# Patient Record
Sex: Male | Born: 1950 | Race: White | Hispanic: No | Marital: Married | Smoking: Never smoker
Health system: Southern US, Community
[De-identification: ages and names within clinical notes are randomized; demographics above are authoritative.]

## PROBLEM LIST (undated history)

## (undated) DIAGNOSIS — Z87442 Personal history of urinary calculi: Secondary | ICD-10-CM

## (undated) DIAGNOSIS — J683 Other acute and subacute respiratory conditions due to chemicals, gases, fumes and vapors: Secondary | ICD-10-CM

## (undated) DIAGNOSIS — J3081 Allergic rhinitis due to animal (cat) (dog) hair and dander: Secondary | ICD-10-CM

## (undated) DIAGNOSIS — G4733 Obstructive sleep apnea (adult) (pediatric): Secondary | ICD-10-CM

## (undated) DIAGNOSIS — N200 Calculus of kidney: Secondary | ICD-10-CM

## (undated) DIAGNOSIS — I10 Essential (primary) hypertension: Secondary | ICD-10-CM

## (undated) DIAGNOSIS — Z8669 Personal history of other diseases of the nervous system and sense organs: Secondary | ICD-10-CM

## (undated) DIAGNOSIS — N486 Induration penis plastica: Secondary | ICD-10-CM

## (undated) DIAGNOSIS — K760 Fatty (change of) liver, not elsewhere classified: Secondary | ICD-10-CM

## (undated) DIAGNOSIS — C439 Malignant melanoma of skin, unspecified: Secondary | ICD-10-CM

## (undated) DIAGNOSIS — Z9109 Other allergy status, other than to drugs and biological substances: Secondary | ICD-10-CM

## (undated) DIAGNOSIS — M1990 Unspecified osteoarthritis, unspecified site: Secondary | ICD-10-CM

## (undated) DIAGNOSIS — I878 Other specified disorders of veins: Secondary | ICD-10-CM

## (undated) DIAGNOSIS — R7301 Impaired fasting glucose: Secondary | ICD-10-CM

## (undated) HISTORY — PX: TOOTH EXTRACTION: SUR596

## (undated) HISTORY — PX: SHOULDER ARTHROSCOPY: SHX128

## (undated) HISTORY — DX: Other acute and subacute respiratory conditions due to chemicals, gases, fumes and vapors: J68.3

## (undated) HISTORY — PX: KNEE ARTHROSCOPY: SUR90

## (undated) HISTORY — DX: Impaired fasting glucose: R73.01

## (undated) HISTORY — DX: Other specified disorders of veins: I87.8

## (undated) HISTORY — DX: Fatty (change of) liver, not elsewhere classified: K76.0

## (undated) HISTORY — PX: COLONOSCOPY: SHX174

---

## 2003-09-05 ENCOUNTER — Emergency Department (HOSPITAL_COMMUNITY): Admission: EM | Admit: 2003-09-05 | Discharge: 2003-09-05 | Payer: Self-pay | Admitting: Emergency Medicine

## 2003-10-07 ENCOUNTER — Ambulatory Visit (HOSPITAL_COMMUNITY): Admission: RE | Admit: 2003-10-07 | Discharge: 2003-10-07 | Payer: Self-pay | Admitting: *Deleted

## 2005-03-23 ENCOUNTER — Emergency Department (HOSPITAL_COMMUNITY): Admission: AD | Admit: 2005-03-23 | Discharge: 2005-03-23 | Payer: Self-pay | Admitting: Family Medicine

## 2006-01-30 ENCOUNTER — Ambulatory Visit: Payer: Self-pay | Admitting: Internal Medicine

## 2006-01-30 ENCOUNTER — Ambulatory Visit (HOSPITAL_COMMUNITY): Admission: RE | Admit: 2006-01-30 | Discharge: 2006-01-30 | Payer: Self-pay | Admitting: Internal Medicine

## 2006-05-28 ENCOUNTER — Ambulatory Visit (HOSPITAL_COMMUNITY): Admission: RE | Admit: 2006-05-28 | Discharge: 2006-05-28 | Payer: Self-pay | Admitting: Family Medicine

## 2007-08-18 ENCOUNTER — Emergency Department (HOSPITAL_COMMUNITY): Admission: EM | Admit: 2007-08-18 | Discharge: 2007-08-19 | Payer: Self-pay | Admitting: Emergency Medicine

## 2007-12-08 ENCOUNTER — Emergency Department (HOSPITAL_COMMUNITY): Admission: EM | Admit: 2007-12-08 | Discharge: 2007-12-09 | Payer: Self-pay | Admitting: Emergency Medicine

## 2007-12-14 ENCOUNTER — Ambulatory Visit (HOSPITAL_COMMUNITY): Admission: RE | Admit: 2007-12-14 | Discharge: 2007-12-14 | Payer: Self-pay | Admitting: Urology

## 2008-11-07 ENCOUNTER — Ambulatory Visit (HOSPITAL_COMMUNITY): Admission: RE | Admit: 2008-11-07 | Discharge: 2008-11-07 | Payer: Self-pay | Admitting: Family Medicine

## 2009-08-11 ENCOUNTER — Emergency Department (HOSPITAL_COMMUNITY): Admission: EM | Admit: 2009-08-11 | Discharge: 2009-08-11 | Payer: Self-pay | Admitting: Emergency Medicine

## 2010-05-06 ENCOUNTER — Encounter: Payer: Self-pay | Admitting: Family Medicine

## 2010-08-30 NOTE — Op Note (Signed)
NAMEMITESH, ROSENDAHL                ACCOUNT NO.:  [id number]   MEDICAL RECORD NO.:  [id number]          PATIENT TYPE:  AMB   LOCATION:  DAY                           FACILITY:  [facility]   PHYSICIAN:  R. Roetta Sessions, M.D. DATE OF BIRTH:  20-Jan-1951   DATE OF PROCEDURE:  01/30/2006  DATE OF DISCHARGE:                                 OPERATIVE REPORT   PROCEDURE:  Screening colonoscopy.   INDICATIONS FOR PROCEDURE:  The patient is a 60 year old Caucasian male sent  over courtesy of Dr. Simone Curia for colorectal cancer screening.  He is  devoid of any lower GI tract symptoms.  There is no family history of  colorectal neoplasia.  He has never had his colon imaged previously.  Colonoscopy is now being done as standard screening maneuver.  This approach  has discussed the patient at length.  Potential risks, benefits,  alternatives have been reviewed, questions answered.  He is agreeable.  Please see documentation in the medical record.   PROCEDURE NOTE:  O2 saturation, blood pressure, pulse, respiration,  monitored throughout entire procedure.   CONSCIOUS SEDATION:  Versed 4 mg IV, Demerol 75 mg IV in divided doses.   INSTRUMENT USED:  Olympus video chip system.   FINDINGS:  Digital rectal exam revealed no abnormalities.   ENDOSCOPIC FINDINGS:  The prep was adequate.  Rectum:  Examination rectal mucosa including retroflex view of the anal  verge revealed no abnormalities.  Colon:  Colonic mucosa surveyed from rectosigmoid junction through the left,  transverse, right colon to the area of appendiceal orifice, ileocecal valve  and cecum.  These structures well seen and photographed for the record.  From this level scope slowly withdrawn, all previously mentioned  mucosal  surfaces were again seen.  The patient was noted have scattered left-sided  diverticula.  Remainder colonic mucosa appeared normal.  The patient  tolerated the procedure well.  Was reacted in endoscopy.   IMPRESSION:  Normal rectum, left-sided diverticula, remaining colonic mucosa  appeared normal.   RECOMMENDATIONS:  Diverticulosis literature provided Mr. Syme.  Repeat  screening colonoscopy 10 years.      Jonathon Bellows, M.D.  Electronically Signed     RMR/MEDQ  D:  01/30/2006  T:  01/31/2006  Job:  [id number]   cc:   Donna Bernard, M.D.  Fax: [phone]

## 2011-06-27 ENCOUNTER — Emergency Department (HOSPITAL_COMMUNITY)
Admission: EM | Admit: 2011-06-27 | Discharge: 2011-06-27 | Disposition: A | Discharge status: Home / Self Care | Payer: 59 | Source: Home / Self Care | Attending: Emergency Medicine | Admitting: Emergency Medicine

## 2011-06-27 ENCOUNTER — Encounter (HOSPITAL_COMMUNITY): Payer: Self-pay | Admitting: Emergency Medicine

## 2011-06-27 DIAGNOSIS — S058X9A Other injuries of unspecified eye and orbit, initial encounter: Secondary | ICD-10-CM

## 2011-06-27 DIAGNOSIS — S0500XA Injury of conjunctiva and corneal abrasion without foreign body, unspecified eye, initial encounter: Secondary | ICD-10-CM

## 2011-06-27 HISTORY — DX: Allergic rhinitis due to animal (cat) (dog) hair and dander: J30.81

## 2011-06-27 HISTORY — DX: Essential (primary) hypertension: I10

## 2011-06-27 HISTORY — DX: Calculus of kidney: N20.0

## 2011-06-27 HISTORY — DX: Other allergy status, other than to drugs and biological substances: Z91.09

## 2011-06-27 MED ORDER — CIPROFLOXACIN HCL 0.3 % OP SOLN: OPHTHALMIC | Status: DC

## 2011-06-27 MED ORDER — OXYCODONE-ACETAMINOPHEN 5-325 MG PO TABS: ORAL_TABLET | ORAL | Status: AC

## 2011-06-27 MED ORDER — TETRACAINE HCL 0.5 % OP SOLN
OPHTHALMIC | Status: AC
  Filled 2011-06-27: qty 2

## 2011-06-27 NOTE — ED Notes (Signed)
States he was trimming limbs off trees today, picked up a limb, and hit himself in right eye with limb.  C/O continued pain, light sensitivity, and irritation.  C/o blurred vision in right eye.  Has applied cold cloth and instilled Visine eye drops.

## 2011-06-27 NOTE — Discharge Instructions (Signed)
Corneal Abrasion The cornea is the clear covering at the front and center of the eye. When looking at the colored portion (iris) of the eye, you are looking through that person's cornea.  This very thin tissue is made up of many layers. The surface layer is a single layer of cells called the corneal epithelium. This is one of the most sensitive tissues in the body. If a scratch or injury causes the corneal epithelium to come off, it is called a corneal abrasion. If the injury extends to the tissues below the epithelium, the condition is called a corneal ulcer.  CAUSES   Scratches.   Trauma.   Foreign body in the eye.   Some people have recurrences of abrasions in the area of the original injury even after they heal. This is called recurrent erosion syndrome. Recurrent erosion syndromes generally improve and go away with time.  SYMPTOMS   Eye pain.   Difficulty or inability to keep the injured eye open.   The eye becomes very sensitive to light.   Recurrent erosions tend to happen suddenly, first thing in the morning - usually upon awakening and opening the eyes.  DIAGNOSIS  Your eye professional can diagnose a corneal abrasion during an eye exam. Dye is usually placed in the eye using a drop or a small paper strip moistened by the patient's tears. When the eye is examined with a special light, the abrasion shows up clearly because of the dye. TREATMENT   Small abrasions may be treated with antibiotic drops or ointment alone.   Usually a pressure patch is specially applied. Pressure patches prevent the eye from blinking, allowing the corneal epithelium to heal. Because blinking is less, a pressure patch also reduces the amount of pain present in the eye during healing. Most corneal abrasions heal within 2-3 days with no effect on vision. WARNING: Do not drive or operate machinery while your eye is patched. Your ability to judge distances is impaired.   If abrasion becomes infected and  spreads to the deeper tissues of the cornea, a corneal ulcer can result. This is serious because it can cause corneal scarring. Corneal scars interfere with light passing through the cornea, and cause a loss of vision in the involved eye.   If your caregiver has given you a follow-up appointment, it is very important to keep that appointment. Not keeping the appointment could result in a severe eye infection or permanent loss of vision. If there is any problem keeping the appointment, you must call back to this facility for assistance.  SEEK MEDICAL CARE IF:   You have pain, light sensitivity and a scratchy feeling in one eye (or both).   Your pressure patch keeps loosening up and you can blink your eye under the patch after treatment.   Any kind of discharge develops from the involved eye after treatment or if the lids stick together in the morning.   You have the same symptoms in the morning as you did with the original abrasion days, weeks or months after the abrasion healed.  MAKE SURE YOU:   Understand these instructions.   Will watch your condition.   Will get help right away if you are not doing well or get worse.  Document Released: 03/28/2000 Document Revised: 03/20/2011 Document Reviewed: 11/04/2007 [employer] Patient Information 2012 [employer]. 

## 2011-06-27 NOTE — ED Provider Notes (Signed)
Chief Complaint  Patient presents with  . Eye Injury    History of Present Illness:   [name] is a 61 year old male who injured his right eye about 2:30 this afternoon. He poked a branch in the eye ever since then the vision has been blurry in the eye has been very painful. Has been red and tearing. It hurts to blink or to move his eyes.  Review of Systems:  Other than noted above, the patient denies any of the following symptoms: Systemic:  No fever, chills, sweats, fatigue, or weight loss. Eye:  No redness, eye pain, photophobia, discharge, blurred vision, or diplopia. ENT:  No nasal congestion, rhinorrhea, or sore throat. Lymphatic:  No adenopathy. Skin:  No rash or pruritis.  PMFSH:  Past medical history, family history, social history, meds, and allergies were reviewed.  Physical Exam:   Vital signs:  BP 144/93  Pulse 71  Temp(Src) 98.4 F (36.9 C) (Oral)  Resp 18  SpO2 96% General:  Alert and in no distress. Eye:  The lids and periorbital tissues are normal. The conjunctiva is injected and the eye is tearing. There no foreign bodies present. The anterior chamber was normal. Funduscopic exam is normal. The cornea is grossly intact. Fluorescein staining however shows a small central abrasion. PERRLA, full EOMs. ENT:  TMs and canals clear.  Nasal mucosa normal.  No intra-oral lesions, mucous membranes moist, pharynx clear. Neck:  No adenopathy tenderness or mass. Skin:  Clear, warm and dry.  Assessment:   Diagnoses that have been ruled out:  None  Diagnoses that are still under consideration:  None  Final diagnoses:  Corneal abrasion    Plan:   1.  The following meds were prescribed:   New Prescriptions   CIPROFLOXACIN (CILOXAN) 0.3 % OPHTHALMIC SOLUTION    Administer 1 drop, every 2 hours, while awake, for 2 days. Then 1 drop, every 4 hours, while awake, for the next 5 days.   OXYCODONE-ACETAMINOPHEN (PERCOCET) 5-325 MG PER TABLET    1 to 2 tablets every 6 hours as  needed for pain.   2.  The patient was instructed in symptomatic care and handouts were given. 3.  The patient was told to return if becoming worse in any way, if no better in 3 or 4 days, and given some red flag symptoms that would indicate earlier return.     Reuben Likes, MD 06/27/11 2206

## 2011-06-27 NOTE — ED Notes (Signed)
Patient does wear glasses.

## 2013-06-18 ENCOUNTER — Other Ambulatory Visit: Payer: Self-pay | Admitting: Family Medicine

## 2013-06-22 ENCOUNTER — Other Ambulatory Visit: Payer: Self-pay | Admitting: Family Medicine

## 2013-06-23 ENCOUNTER — Other Ambulatory Visit: Payer: Self-pay | Admitting: Family Medicine

## 2013-06-25 ENCOUNTER — Encounter: Payer: Self-pay | Admitting: *Deleted

## 2013-07-11 ENCOUNTER — Other Ambulatory Visit: Payer: Self-pay | Admitting: *Deleted

## 2013-07-11 ENCOUNTER — Telehealth: Payer: Self-pay | Admitting: Family Medicine

## 2013-07-11 DIAGNOSIS — K760 Fatty (change of) liver, not elsewhere classified: Secondary | ICD-10-CM

## 2013-07-11 DIAGNOSIS — I10 Essential (primary) hypertension: Secondary | ICD-10-CM

## 2013-07-11 DIAGNOSIS — Z13220 Encounter for screening for lipoid disorders: Secondary | ICD-10-CM

## 2013-07-11 DIAGNOSIS — Z125 Encounter for screening for malignant neoplasm of prostate: Secondary | ICD-10-CM

## 2013-07-11 NOTE — Telephone Encounter (Signed)
Lip liv m7 psa 

## 2013-07-11 NOTE — Telephone Encounter (Signed)
Patient needs order for blood work. °

## 2013-07-11 NOTE — Telephone Encounter (Signed)
Patient notified and verbalized understanding. 

## 2013-07-11 NOTE — Telephone Encounter (Signed)
Patient has not been seen in over a year 

## 2013-07-13 LAB — BASIC METABOLIC PANEL
BUN: 20 mg/dL (ref 6–23)
CO2: 26 mEq/L (ref 19–32)
Calcium: 8.7 mg/dL (ref 8.4–10.5)
Chloride: 107 mEq/L (ref 96–112)
Creat: 0.93 mg/dL (ref 0.50–1.35)
Glucose, Bld: 85 mg/dL (ref 70–99)
POTASSIUM: 4.1 meq/L (ref 3.5–5.3)
Sodium: 142 mEq/L (ref 135–145)

## 2013-07-13 LAB — HEPATIC FUNCTION PANEL
ALBUMIN: 3.8 g/dL (ref 3.5–5.2)
ALT: 17 U/L (ref 0–53)
AST: 19 U/L (ref 0–37)
Alkaline Phosphatase: 73 U/L (ref 39–117)
BILIRUBIN DIRECT: 0.2 mg/dL (ref 0.0–0.3)
BILIRUBIN INDIRECT: 0.6 mg/dL (ref 0.2–1.2)
BILIRUBIN TOTAL: 0.8 mg/dL (ref 0.2–1.2)
TOTAL PROTEIN: 6.3 g/dL (ref 6.0–8.3)

## 2013-07-13 LAB — LIPID PANEL
CHOL/HDL RATIO: 3.1 ratio
Cholesterol: 169 mg/dL (ref 0–200)
HDL: 54 mg/dL (ref >39)
LDL Cholesterol: 102 mg/dL — ABNORMAL HIGH (ref 0–99)
TRIGLYCERIDES: 66 mg/dL (ref <150)
VLDL: 13 mg/dL (ref 0–40)

## 2013-07-14 LAB — PSA: PSA: 0.31 ng/mL (ref <=4.00)

## 2013-07-20 ENCOUNTER — Ambulatory Visit (INDEPENDENT_AMBULATORY_CARE_PROVIDER_SITE_OTHER): Payer: Managed Care, Other (non HMO) | Admitting: Family Medicine

## 2013-07-20 ENCOUNTER — Encounter: Payer: Self-pay | Admitting: Family Medicine

## 2013-07-20 VITALS — BP 130/78 | Ht 71.5 in | Wt 258.6 lb

## 2013-07-20 DIAGNOSIS — Z0000 Encounter for general adult medical examination without abnormal findings: Secondary | ICD-10-CM

## 2013-07-20 DIAGNOSIS — I10 Essential (primary) hypertension: Secondary | ICD-10-CM

## 2013-07-20 MED ORDER — ENALAPRIL MALEATE 10 MG PO TABS: 10.0000 mg | ORAL_TABLET | Freq: Every day | ORAL | Status: DC

## 2013-07-20 NOTE — Progress Notes (Signed)
Subjective:    Patient ID: Fernando Riley, male    DOB: 14-Jul-1950, 63 y.o.   MRN: [id number]  HPI  Patient arrives for a yearly physical.      Results for orders placed in visit on 07/11/13  LIPID PANEL      Result Value Ref Range   Cholesterol 169  0 - 200 mg/dL   Triglycerides 66  <150 mg/dL   HDL 54  >39 mg/dL   Total CHOL/HDL Ratio 3.1     VLDL 13  0 - 40 mg/dL   LDL Cholesterol 102 (*) 0 - 99 mg/dL  HEPATIC FUNCTION PANEL      Result Value Ref Range   Total Bilirubin 0.8  0.2 - 1.2 mg/dL   Bilirubin, Direct 0.2  0.0 - 0.3 mg/dL   Indirect Bilirubin 0.6  0.2 - 1.2 mg/dL   Alkaline Phosphatase 73  39 - 117 U/L   AST 19  0 - 37 U/L   ALT 17  0 - 53 U/L   Total Protein 6.3  6.0 - 8.3 g/dL   Albumin 3.8  3.5 - 5.2 g/dL  BASIC METABOLIC PANEL      Result Value Ref Range   Sodium 142  135 - 145 mEq/L   Potassium 4.1  3.5 - 5.3 mEq/L   Chloride 107  96 - 112 mEq/L   CO2 26  19 - 32 mEq/L   Glucose, Bld 85  70 - 99 mg/dL   BUN 20  6 - 23 mg/dL   Creat 0.93  0.50 - 1.35 mg/dL   Calcium 8.7  8.4 - 10.5 mg/dL  PSA      Result Value Ref Range   PSA 0.31  <=4.00 ng/mL   Patient reports pain in both shoulders that comes and goes and pain  In his left achilles tendon at times. Mainly right shoulder. History of old separation shoulder injury. Still gets pain right at this area.  Notes posterior left heel pain. Worse with certain motions. Progressive over the past couple years. Recalls no injury. Next  Claims compliance with blood pressure medicine.  Fair compliance with diet but not great.  BP generally good at home 120s over mid 70s  2007, has not seen any blood in stool, not exercising much  Active out of work    Review of Systems  Constitutional: Negative for fever, activity change and appetite change.  HENT: Negative for congestion and rhinorrhea.   Eyes: Negative for discharge.  Respiratory: Negative for cough and wheezing.   Cardiovascular: Negative for  chest pain.  Gastrointestinal: Negative for vomiting, abdominal pain and blood in stool.  Genitourinary: Negative for frequency and difficulty urinating.  Musculoskeletal: Negative for neck pain.  Skin: Negative for rash.  Allergic/Immunologic: Negative for environmental allergies and food allergies.  Neurological: Negative for weakness and headaches.  Psychiatric/Behavioral: Negative for agitation.  All other systems reviewed and are negative.      Objective:   Physical Exam  Vitals reviewed. Constitutional: He appears well-developed and well-nourished.  HENT:  Head: Normocephalic and atraumatic.  Right Ear: External ear normal.  Left Ear: External ear normal.  Nose: Nose normal.  Mouth/Throat: Oropharynx is clear and moist.  Eyes: EOM are normal. Pupils are equal, round, and reactive to light.  Neck: Normal range of motion. Neck supple. No thyromegaly present.  Cardiovascular: Normal rate, regular rhythm and normal heart sounds.   No murmur heard. Pulmonary/Chest: Effort normal and breath sounds normal. No respiratory  distress. He has no wheezes.  Abdominal: Soft. Bowel sounds are normal. He exhibits no distension and no mass. There is no tenderness.  Genitourinary: Prostate normal and penis normal.  Musculoskeletal: Normal range of motion. He exhibits no edema.  Left posterior heel tenderness deep palpation. Right a.c. joint right shoulder somewhat tender to deep palpation.  Lymphadenopathy:    He has no cervical adenopathy.  Neurological: He is alert. He exhibits normal muscle tone.  Skin: Skin is warm and dry. No erythema.  Psychiatric: He has a normal mood and affect. His behavior is normal. Judgment normal.          Assessment & Plan:  Impression 1 wellness exam. #2 hypertension good control. #3 he'll bursitis discussed #4 probable arthritis right shoulder discussed. #5 obesity plan Hemoccult cards. Diet exercise discussed. Maintain same medications. Recheck in 6  months. WSL

## 2013-07-22 ENCOUNTER — Other Ambulatory Visit: Payer: Self-pay | Admitting: Family Medicine

## 2013-07-26 ENCOUNTER — Other Ambulatory Visit: Payer: Self-pay | Admitting: Family Medicine

## 2013-08-04 ENCOUNTER — Other Ambulatory Visit: Payer: Self-pay | Admitting: *Deleted

## 2013-08-04 DIAGNOSIS — Z0000 Encounter for general adult medical examination without abnormal findings: Secondary | ICD-10-CM

## 2013-08-04 LAB — POC HEMOCCULT BLD/STL (HOME/3-CARD/SCREEN)
Card #3 Fecal Occult Blood, POC: NEGATIVE
FECAL OCCULT BLD: NEGATIVE
Fecal Occult Blood, POC: NEGATIVE

## 2014-01-16 ENCOUNTER — Ambulatory Visit: Payer: Managed Care, Other (non HMO) | Admitting: Family Medicine

## 2014-01-30 ENCOUNTER — Ambulatory Visit: Payer: Managed Care, Other (non HMO) | Admitting: Family Medicine

## 2014-02-06 ENCOUNTER — Encounter: Payer: Self-pay | Admitting: Family Medicine

## 2014-02-06 ENCOUNTER — Ambulatory Visit (INDEPENDENT_AMBULATORY_CARE_PROVIDER_SITE_OTHER): Payer: Managed Care, Other (non HMO) | Admitting: Family Medicine

## 2014-02-06 VITALS — BP 130/82 | Ht 71.5 in | Wt 260.0 lb

## 2014-02-06 DIAGNOSIS — I10 Essential (primary) hypertension: Secondary | ICD-10-CM

## 2014-02-06 MED ORDER — ENALAPRIL MALEATE 10 MG PO TABS: 10.0000 mg | ORAL_TABLET | Freq: Every day | ORAL | Status: DC

## 2014-02-06 NOTE — Progress Notes (Signed)
   Subjective:    Patient ID: Fernando Riley, male    DOB: [birth year], 63 y.o.   MRN: [id number]  HPI Patient is here today for a 6 month check up.  He refused the flu vaccine.  Would like a refill on his meds.  Does not have any concerns.   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 Uses when necessary over-the-counter medicine.  Review of Systems No headache no chest pain    Objective:   Physical Exam  118 74 Alert no apparent distress lungs clear heart regular rhythm H regional ankle shunting       Assessment & Plan:  Impression hypertension good control him or to arthritis discuss plan symptomatic care discussed exercise encouraged. Medications refilled. Recheck in 6 months. Wellness at that time W SL

## 2014-03-17 ENCOUNTER — Encounter: Payer: Self-pay | Admitting: Family Medicine

## 2014-03-17 ENCOUNTER — Ambulatory Visit (INDEPENDENT_AMBULATORY_CARE_PROVIDER_SITE_OTHER): Payer: Managed Care, Other (non HMO) | Admitting: Family Medicine

## 2014-03-17 VITALS — BP 136/84 | Temp 98.3°F | Ht 71.5 in | Wt 263.0 lb

## 2014-03-17 DIAGNOSIS — J310 Chronic rhinitis: Secondary | ICD-10-CM

## 2014-03-17 DIAGNOSIS — J329 Chronic sinusitis, unspecified: Secondary | ICD-10-CM

## 2014-03-17 MED ORDER — AMOXICILLIN-POT CLAVULANATE 875-125 MG PO TABS: 1.0000 | ORAL_TABLET | Freq: Two times a day (BID) | ORAL | Status: AC

## 2014-03-17 NOTE — Progress Notes (Signed)
   Subjective:    Patient ID: Fernando Riley, male    DOB: [birth year], 63 y.o.   MRN: [id number]  Cough This is a new problem. The current episode started yesterday. The problem has been gradually worsening. The cough is productive of sputum. Associated symptoms include wheezing. Associated symptoms comments: Sinus pressure. Nothing aggravates the symptoms. Treatments tried: vit C.  only slight wheezing  On vit c  Clear disch when coughing  Pale yell disch   At time  Other sick with symptoms    Review of Systems  Respiratory: Positive for cough and wheezing.    No vomiting no diarrhea ROS otherwise negative    Objective:   Physical Exam  Alert no acute distress HEENT moderate nasal congestion pharynx slight erythema lungs bronchial sounds. No wheezes no crackles heart regular in rhythm.      Assessment & Plan:  Impression rhinosinusitis/bronchitis plan antibiotics prescribed. Symptomatic care discussed. WSL

## 2014-04-10 ENCOUNTER — Encounter: Payer: Self-pay | Admitting: Family Medicine

## 2014-04-14 HISTORY — PX: SHOULDER SURGERY: SHX246

## 2014-07-03 ENCOUNTER — Telehealth: Payer: Self-pay | Admitting: Family Medicine

## 2014-07-03 DIAGNOSIS — Z125 Encounter for screening for malignant neoplasm of prostate: Secondary | ICD-10-CM

## 2014-07-03 DIAGNOSIS — Z79899 Other long term (current) drug therapy: Secondary | ICD-10-CM

## 2014-07-03 DIAGNOSIS — I10 Essential (primary) hypertension: Secondary | ICD-10-CM

## 2014-07-03 NOTE — Telephone Encounter (Signed)
Does patient need order for BW?

## 2014-07-03 NOTE — Telephone Encounter (Signed)
Lip m7 psa liv plus pe and htn fu

## 2014-07-03 NOTE — Telephone Encounter (Signed)
Left message on voicemail notifying patient that blood work has been ordered and to report to [facility].

## 2014-07-18 LAB — LIPID PANEL
CHOL/HDL RATIO: 3.2 ratio (ref 0.0–5.0)
Cholesterol, Total: 184 mg/dL (ref 100–199)
HDL: 58 mg/dL (ref >39)
LDL CALC: 113 mg/dL — AB (ref 0–99)
TRIGLYCERIDES: 64 mg/dL (ref 0–149)
VLDL Cholesterol Cal: 13 mg/dL (ref 5–40)

## 2014-07-18 LAB — BASIC METABOLIC PANEL
BUN / CREAT RATIO: 21 (ref 10–22)
BUN: 21 mg/dL (ref 8–27)
CHLORIDE: 106 mmol/L (ref 97–108)
CO2: 23 mmol/L (ref 18–29)
Calcium: 9 mg/dL (ref 8.6–10.2)
Creatinine, Ser: 0.99 mg/dL (ref 0.76–1.27)
GFR calc Af Amer: 95 mL/min/{1.73_m2} (ref >59)
GFR calc non Af Amer: 82 mL/min/{1.73_m2} (ref >59)
Glucose: 96 mg/dL (ref 65–99)
POTASSIUM: 4.2 mmol/L (ref 3.5–5.2)
SODIUM: 142 mmol/L (ref 134–144)

## 2014-07-18 LAB — HEPATIC FUNCTION PANEL
ALK PHOS: 82 IU/L (ref 39–117)
ALT: 19 IU/L (ref 0–44)
AST: 22 IU/L (ref 0–40)
Albumin: 4.1 g/dL (ref 3.6–4.8)
BILIRUBIN TOTAL: 0.6 mg/dL (ref 0.0–1.2)
BILIRUBIN, DIRECT: 0.17 mg/dL (ref 0.00–0.40)
TOTAL PROTEIN: 6.3 g/dL (ref 6.0–8.5)

## 2014-07-18 LAB — PSA: PSA: 0.2 ng/mL (ref 0.0–4.0)

## 2014-07-24 ENCOUNTER — Ambulatory Visit (INDEPENDENT_AMBULATORY_CARE_PROVIDER_SITE_OTHER): Payer: Managed Care, Other (non HMO) | Admitting: Family Medicine

## 2014-07-24 ENCOUNTER — Encounter: Payer: Self-pay | Admitting: Family Medicine

## 2014-07-24 VITALS — BP 130/80 | Ht 71.5 in | Wt 256.4 lb

## 2014-07-24 DIAGNOSIS — Z0000 Encounter for general adult medical examination without abnormal findings: Secondary | ICD-10-CM

## 2014-07-24 MED ORDER — ENALAPRIL MALEATE 10 MG PO TABS: 10.0000 mg | ORAL_TABLET | Freq: Every day | ORAL | Status: DC

## 2014-07-24 NOTE — Patient Instructions (Signed)
Results for orders placed or performed in visit on 07/03/14  Lipid panel  Result Value Ref Range   Cholesterol, Total 184 100 - 199 mg/dL   Triglycerides 64 0 - 149 mg/dL   HDL 58 >39 mg/dL   VLDL Cholesterol Cal 13 5 - 40 mg/dL   LDL Calculated 113 (H) 0 - 99 mg/dL   Chol/HDL Ratio 3.2 0.0 - 5.0 ratio units  Basic metabolic panel  Result Value Ref Range   Glucose 96 65 - 99 mg/dL   BUN 21 8 - 27 mg/dL   Creatinine, Ser 0.99 0.76 - 1.27 mg/dL   GFR calc non Af Amer 82 >59 mL/min/1.73   GFR calc Af Amer 95 >59 mL/min/1.73   BUN/Creatinine Ratio 21 10 - 22   Sodium 142 134 - 144 mmol/L   Potassium 4.2 3.5 - 5.2 mmol/L   Chloride 106 97 - 108 mmol/L   CO2 23 18 - 29 mmol/L   Calcium 9.0 8.6 - 10.2 mg/dL  PSA  Result Value Ref Range   PSA 0.2 0.0 - 4.0 ng/mL  Hepatic function panel  Result Value Ref Range   Total Protein 6.3 6.0 - 8.5 g/dL   Albumin 4.1 3.6 - 4.8 g/dL   Bilirubin Total 0.6 0.0 - 1.2 mg/dL   Bilirubin, Direct 0.17 0.00 - 0.40 mg/dL   Alkaline Phosphatase 82 39 - 117 IU/L   AST 22 0 - 40 IU/L   ALT 19 0 - 44 IU/L

## 2014-07-24 NOTE — Progress Notes (Signed)
Subjective:    Patient ID: Fernando Riley, male    DOB: 22-Feb-1951, 64 y.o.   MRN: [id number]  HPI The patient comes in today for a wellness visit.    A review of their health history was completed.  A review of medications was also completed.  Any needed refills; yes   Eating habits: good  Falls/  MVA accidents in past few months: none  Regular exercise: none  Specialist pt sees on regular basis: Dr. Sharren Bridge, Dr. Ronnald Ramp  Preventative health issues were discussed.   Additional concerns: none  Last colon 2007. Done by local gy here  Results for orders placed or performed in visit on 07/03/14  Lipid panel  Result Value Ref Range   Cholesterol, Total 184 100 - 199 mg/dL   Triglycerides 64 0 - 149 mg/dL   HDL 58 >39 mg/dL   VLDL Cholesterol Cal 13 5 - 40 mg/dL   LDL Calculated 113 (H) 0 - 99 mg/dL   Chol/HDL Ratio 3.2 0.0 - 5.0 ratio units  Basic metabolic panel  Result Value Ref Range   Glucose 96 65 - 99 mg/dL   BUN 21 8 - 27 mg/dL   Creatinine, Ser 0.99 0.76 - 1.27 mg/dL   GFR calc non Af Amer 82 >59 mL/min/1.73   GFR calc Af Amer 95 >59 mL/min/1.73   BUN/Creatinine Ratio 21 10 - 22   Sodium 142 134 - 144 mmol/L   Potassium 4.2 3.5 - 5.2 mmol/L   Chloride 106 97 - 108 mmol/L   CO2 23 18 - 29 mmol/L   Calcium 9.0 8.6 - 10.2 mg/dL  PSA  Result Value Ref Range   PSA 0.2 0.0 - 4.0 ng/mL  Hepatic function panel  Result Value Ref Range   Total Protein 6.3 6.0 - 8.5 g/dL   Albumin 4.1 3.6 - 4.8 g/dL   Bilirubin Total 0.6 0.0 - 1.2 mg/dL   Bilirubin, Direct 0.17 0.00 - 0.40 mg/dL   Alkaline Phosphatase 82 39 - 117 IU/L   AST 22 0 - 40 IU/L   ALT 19 0 - 44 IU/L    Gets up more to urinate now, oncd per night ish  Mind starts churnig at night ,   Not exercising    Review of Systems  Constitutional: Negative for fever, activity change and appetite change.  HENT: Negative for congestion and rhinorrhea.   Eyes: Negative for discharge.  Respiratory:  Negative for cough and wheezing.   Cardiovascular: Negative for chest pain.  Gastrointestinal: Negative for vomiting, abdominal pain and blood in stool.  Genitourinary: Negative for frequency and difficulty urinating.  Musculoskeletal: Negative for neck pain.  Skin: Negative for rash.  Allergic/Immunologic: Negative for environmental allergies and food allergies.  Neurological: Negative for weakness and headaches.  Psychiatric/Behavioral: Negative for agitation.  All other systems reviewed and are negative.      Objective:   Physical Exam  Constitutional: He appears well-developed and well-nourished.  HENT:  Head: Normocephalic and atraumatic.  Right Ear: External ear normal.  Left Ear: External ear normal.  Nose: Nose normal.  Mouth/Throat: Oropharynx is clear and moist.  Eyes: EOM are normal. Pupils are equal, round, and reactive to light.  Neck: Normal range of motion. Neck supple. No thyromegaly present.  Cardiovascular: Normal rate, regular rhythm and normal heart sounds.   No murmur heard. Pulmonary/Chest: Effort normal and breath sounds normal. No respiratory distress. He has no wheezes.  Abdominal: Soft. Bowel sounds are normal. He exhibits  no distension and no mass. There is no tenderness.  Genitourinary: Penis normal.  Musculoskeletal: Normal range of motion. He exhibits no edema.  Lymphadenopathy:    He has no cervical adenopathy.  Neurological: He is alert. He exhibits normal muscle tone.  Skin: Skin is warm and dry. No erythema.  Psychiatric: He has a normal mood and affect. His behavior is normal. Judgment normal.  Vitals reviewed.         Assessment & Plan:  Impression 1 well until exam #2 obesity discussed #3 hypertension good control plan Hemoccult cards. Diet discussed exercise discussed. Recheck in 6 months. Blood work reviewed. WSL

## 2014-07-31 LAB — POC HEMOCCULT BLD/STL (HOME/3-CARD/SCREEN)
Card #3 Fecal Occult Blood, POC: NEGATIVE
FECAL OCCULT BLD: NEGATIVE
Fecal Occult Blood, POC: NEGATIVE

## 2014-08-13 ENCOUNTER — Other Ambulatory Visit: Payer: Self-pay | Admitting: Family Medicine

## 2014-11-21 ENCOUNTER — Other Ambulatory Visit: Payer: Self-pay | Admitting: Family Medicine

## 2015-01-22 ENCOUNTER — Ambulatory Visit (INDEPENDENT_AMBULATORY_CARE_PROVIDER_SITE_OTHER): Payer: Managed Care, Other (non HMO) | Admitting: Family Medicine

## 2015-01-22 ENCOUNTER — Encounter: Payer: Self-pay | Admitting: Family Medicine

## 2015-01-22 VITALS — BP 128/78 | Ht 71.5 in | Wt 259.2 lb

## 2015-01-22 DIAGNOSIS — M7541 Impingement syndrome of right shoulder: Secondary | ICD-10-CM

## 2015-01-22 DIAGNOSIS — I10 Essential (primary) hypertension: Secondary | ICD-10-CM | POA: Diagnosis not present

## 2015-01-22 NOTE — Progress Notes (Signed)
   Subjective:    Patient ID: Fernando Riley, male    DOB: 05-29-1950, 64 y.o.   MRN: [id number]  Hypertension This is a chronic problem. The current episode started more than 1 month ago. There are no compliance problems.    Patient states no concerns this visit. exerising regulr eating less and better  BP s generally very good, compliant with medication. No obvious side effect does not miss a dose  Right shoulder discomfort progressiv. Right handed, recalls poss injury a long time ago   Six nmo duration  chondroiton supp and has helped some but not as much as he hoped. Affecting his ability to do his work during the day.   At times sudden sharp stabbing pain. Worse when he tries to lift his arms over his shoulder.  [facility] ,        Review of Systems No headache no chest pain no back pain no abdominal pain no change in bowel habits complete review systems otherwise negative    Objective:   Physical Exam  Alert vital stable blood pressure good on repeat HEENT normal. Lungs clear. Heart regular in rhythm. Shoulder good range of motion. Positive impingement sign. Strength intact. Some tenderness to deep palpation posterior joint line reflexes good pulses good neck supple      Assessment & Plan:  Pressure 1 hypertension good control discussed #2 impingement sign probable rotator cuff injury right shoulder discussed plan patient to call orthopedist exercise discussed anti-inflammatory medicine recommended. Maintain same blood pressure medicine diet exercise discussed recheck in 6 months. Checkup plus wellness than WSL

## 2015-02-28 ENCOUNTER — Other Ambulatory Visit (HOSPITAL_COMMUNITY): Payer: Self-pay | Admitting: Orthopaedic Surgery

## 2015-02-28 DIAGNOSIS — M25511 Pain in right shoulder: Secondary | ICD-10-CM

## 2015-03-13 ENCOUNTER — Ambulatory Visit (HOSPITAL_COMMUNITY)
Admission: RE | Admit: 2015-03-13 | Discharge: 2015-03-13 | Disposition: A | Discharge status: Home / Self Care | Payer: Managed Care, Other (non HMO) | Source: Ambulatory Visit | Attending: Orthopaedic Surgery | Admitting: Orthopaedic Surgery

## 2015-03-13 DIAGNOSIS — M75111 Incomplete rotator cuff tear or rupture of right shoulder, not specified as traumatic: Secondary | ICD-10-CM | POA: Insufficient documentation

## 2015-03-13 DIAGNOSIS — M7551 Bursitis of right shoulder: Secondary | ICD-10-CM | POA: Insufficient documentation

## 2015-03-13 DIAGNOSIS — M19011 Primary osteoarthritis, right shoulder: Secondary | ICD-10-CM | POA: Insufficient documentation

## 2015-03-13 DIAGNOSIS — M25511 Pain in right shoulder: Secondary | ICD-10-CM | POA: Insufficient documentation

## 2015-03-20 ENCOUNTER — Ambulatory Visit (HOSPITAL_COMMUNITY): Admission: RE | Admit: 2015-03-20 | Payer: 59 | Source: Ambulatory Visit

## 2015-04-04 ENCOUNTER — Ambulatory Visit (INDEPENDENT_AMBULATORY_CARE_PROVIDER_SITE_OTHER): Payer: Managed Care, Other (non HMO) | Admitting: Family Medicine

## 2015-04-04 ENCOUNTER — Encounter: Payer: Self-pay | Admitting: Family Medicine

## 2015-04-04 VITALS — BP 132/80 | Temp 98.4°F | Ht 71.5 in | Wt 264.0 lb

## 2015-04-04 DIAGNOSIS — J019 Acute sinusitis, unspecified: Secondary | ICD-10-CM

## 2015-04-04 DIAGNOSIS — B9689 Other specified bacterial agents as the cause of diseases classified elsewhere: Secondary | ICD-10-CM

## 2015-04-04 MED ORDER — AMOXICILLIN 500 MG PO TABS: 500.0000 mg | ORAL_TABLET | Freq: Three times a day (TID) | ORAL | Status: DC

## 2015-04-04 NOTE — Progress Notes (Signed)
   Subjective:    Patient ID: Fernando Riley, male    DOB: 07-11-1950, 64 y.o.   MRN: [id number]  URI  This is a new problem. The current episode started in the past 7 days. There has been no fever. Associated symptoms include congestion, coughing, headaches and rhinorrhea. Pertinent negatives include no chest pain, ear pain or wheezing. Treatments tried: Mucinex.   PMH benign Patient states no other concerns this visit.  Review of Systems  Constitutional: Negative for fever and activity change.  HENT: Positive for congestion and rhinorrhea. Negative for ear pain.   Eyes: Negative for discharge.  Respiratory: Positive for cough. Negative for wheezing.   Cardiovascular: Negative for chest pain.  Neurological: Positive for headaches.       Objective:   Physical Exam  Constitutional: He appears well-developed.  HENT:  Head: Normocephalic.  Mouth/Throat: Oropharynx is clear and moist. No oropharyngeal exudate.  Neck: Normal range of motion.  Cardiovascular: Normal rate, regular rhythm and normal heart sounds.   No murmur heard. Pulmonary/Chest: Effort normal and breath sounds normal. He has no wheezes.  Lymphadenopathy:    He has no cervical adenopathy.  Neurological: He exhibits normal muscle tone.  Skin: Skin is warm and dry.  Nursing note and vitals reviewed.         Assessment & Plan:  Patient was seen today for upper respiratory illness. It is felt that the patient is dealing with sinusitis. Antibiotics were prescribed today. Importance of compliance with medication was discussed. Symptoms should gradually resolve over the course of the next several days. If high fevers, progressive illness, difficulty breathing, worsening condition or failure for symptoms to improve over the next several days then the patient is to follow-up. If any emergent conditions the patient is to follow-up in the emergency department otherwise to follow-up in the office.

## 2015-04-20 ENCOUNTER — Encounter (HOSPITAL_COMMUNITY): Payer: Self-pay

## 2015-04-20 ENCOUNTER — Ambulatory Visit (HOSPITAL_COMMUNITY): Payer: Managed Care, Other (non HMO) | Attending: Orthopaedic Surgery

## 2015-04-20 DIAGNOSIS — M25611 Stiffness of right shoulder, not elsewhere classified: Secondary | ICD-10-CM | POA: Diagnosis present

## 2015-04-20 DIAGNOSIS — R29898 Other symptoms and signs involving the musculoskeletal system: Secondary | ICD-10-CM | POA: Diagnosis present

## 2015-04-20 DIAGNOSIS — M25511 Pain in right shoulder: Secondary | ICD-10-CM | POA: Insufficient documentation

## 2015-04-20 DIAGNOSIS — Z9889 Other specified postprocedural states: Secondary | ICD-10-CM | POA: Diagnosis present

## 2015-04-20 NOTE — Patient Instructions (Signed)
TOWEL SLIDES COMPLETE FOR 1-3 MINUTES, 3-5 TIMES PER DAY  SHOULDER: Flexion On Table   Place hands on table, elbows straight. Move hips away from body. Press hands down into table. Hold ___ seconds. ___ reps per set, ___ sets per day, ___ days per week  Abduction (Passive)   With arm out to side, resting on table, lower head toward arm, keeping trunk away from table. Hold ____ seconds. Repeat ____ times. Do ____ sessions per day.  Copyright  VHI. All rights reserved.     Internal Rotation (Assistive)   Seated with elbow bent at right angle and held against side, slide arm on table surface in an inward arc. Repeat ____ times. Do ____ sessions per day. Activity: Use this motion to brush crumbs off the table.  Copyright  VHI. All rights reserved.    COMPLETE PENDULUM EXERCISES FOR 30 SECONDS TO A MINUTE EACH, 3-5 TIMES PER DAY. ROM: Pendulum (Side-to-Side)   Deje que el brazo derecho se balancee suavemente de lado a lado mientras oscila el cuerpo de [place]. Repita ____ veces por rutina. Realice ____ rutinas por sesin. Realice ____ sesiones por da.  http://orth.exer.us/792   Copyright  VHI. All rights reserved.  Pendulum Forward/Back   Bend forward 90 at waist, using table for support. Rock body forward and back to swing arm. Repeat ____ times. Do ____ sessions per day.  Copyright  VHI. All rights reserved.  Pendulum Circular   Bend forward 90 at waist, leaning on table for support. Rock body in a circular pattern to move arm clockwise ____ times then counterclockwise ____ times. Do ____ sessions per day.

## 2015-04-20 NOTE — Therapy (Signed)
Fernando Riley, Fernando Riley, Fernando Riley Phone: [phone]   Fax:  [phone]  Occupational Therapy Evaluation  Patient Details  Name: Fernando Riley MRN: [id number] Date of Birth: 1950/12/11 Referring Provider: Ninfa Linden  Encounter Date: 04/20/2015      OT End of Session - 04/20/15 1400    Visit Number 1   Number of Visits 16   Date for OT Re-Evaluation 06/19/15  mini reassess: 05/18/15   Authorization Type [employer]   Authorization Time Period 60 visit limit. 0 used this year.    Authorization - Visit Number 1   Authorization - Number of Visits 60   OT Start Time 1300   OT Stop Time 1345   OT Time Calculation (min) 45 min   Activity Tolerance Patient tolerated treatment well   Behavior During Therapy WFL for tasks assessed/performed      Past Medical History  Diagnosis Date  . Cat allergies   . Pollen allergies   . Hypertension   . Kidney stone   . Fatty liver   . Reactive airways dysfunction syndrome   . Impaired fasting glucose   . Venous stasis     Past Surgical History  Procedure Laterality Date  . Knee arthroscopy      x2    There were no vitals filed for this visit.  Visit Diagnosis:  S/P arthroscopy of shoulder - Plan: Ot plan of care cert/re-cert  Pain in joint of right shoulder - Plan: Ot plan of care cert/re-cert  Shoulder weakness - Plan: Ot plan of care cert/re-cert  Shoulder stiffness, right - Plan: Ot plan of care cert/re-cert      Subjective Assessment - 04/20/15 1353    Subjective  S: If i'm not moving it then I'm ok.    Pertinent History Patient is a 65 y/o male S/P right arthroscopy with debridement and arcromiplasty. Sx was completed on 04/12/15. Patient arrives to therapy without sling and states that he is allowed complete tasks as he can tolerate. Dr. Ninfa Linden has referred patient to occupational therapy for evaluation and treatment.    Limitations Nothing overhead for 8 weeks.    Special Tests FOTO score: 41/100   Patient Stated Goals To be able to use my arm as close to normal as I can.   Currently in Pain? No/denies  0/10 with no movement. When moving pain increases.            Fernando Riley OT Assessment - 04/20/15 1307    Assessment   Diagnosis Right shoulder arthroscopy   Referring Provider Ninfa Linden   Onset Date 04/12/15   Prior Therapy None   Precautions   Precautions Shoulder   Type of Shoulder Precautions Week 1-3: P/ROM, AA/ROM (except horizontal adduction), pendulums Week 4 (05/11/15) and full P/ROM: Progress to A/ROM . Week 8 (06/11/15): progress to strengthening   Restrictions   Weight Bearing Restrictions Yes   Balance Screen   Has the patient fallen in the past 6 months No   Home  Environment   Family/patient expects to be discharged to: Private residence   Prior Function   Level of Independence Independent   Vocation Full time employment   Vocation Requirements Truck driving   ADL   ADL comments Difficulty   Mobility   Mobility Status Independent   Written Expression   Dominant Hand Right   Vision - History   Baseline Vision Wears glasses all the time   Cognition   Overall Cognitive  Status Within Functional Limits for tasks assessed   ROM / Strength   AROM / PROM / Strength AROM;PROM;Strength   AROM   Overall AROM Comments Assessed supine. IR/er adducted.   AROM Assessment Site Shoulder   Right/Left Shoulder Right   Right Shoulder Flexion 30 Degrees   Right Shoulder ABduction 0 Degrees   Right Shoulder Internal Rotation 60 Degrees   Right Shoulder External Rotation 49 Degrees   PROM   Overall PROM Comments Assessed supine. IR/er adducted.   PROM Assessment Site Shoulder   Right/Left Shoulder Right   Right Shoulder Flexion 90 Degrees   Right Shoulder ABduction 70 Degrees   Right Shoulder Internal Rotation 90 Degrees   Right Shoulder External Rotation 45 Degrees   Strength   Overall Strength Unable to assess;Due to pain;Due to  precautions   Strength Assessment Site Shoulder   Right/Left Shoulder Right                         OT Education - 04/20/15 1359    Education provided Yes   Education Details table slides and pendulum exercises.    Person(s) Educated Patient   Methods Explanation;Demonstration;Verbal cues;Handout   Comprehension Verbalized understanding;Returned demonstration          OT Short Term Goals - 04/20/15 1405    OT SHORT TERM GOAL #1   Title Patient will be educated and independent with a HEP to increase functional use of RUE during daily tasks.    Time 3   Period Weeks   Status New   OT SHORT TERM GOAL #2   Title Patient will increase P/ROM to [facility] to increase ability to get shirts and coats on and off with less difficulty.    Time 3   Period Weeks   Status New   OT SHORT TERM GOAL #3   Title Patient will increase RUE strength to 3-/5 to increase ability to complete tasks at waist level usign RUE.    Time 3   Period Weeks   Status New   OT SHORT TERM GOAL #4   Title Patient will decrease fascial restrictions to min amount in RUE to increase functional mobility needed during daily tasks.    Time 3   Period Weeks   Status New   OT SHORT TERM GOAL #5   Title Patient will decrease pain in RUE during daily tasks to 5/10 or less.   Time 3   Period Weeks   Status New           OT Long Term Goals - 04/20/15 1408    OT LONG TERM GOAL #1   Title Patient will return to highest level of independence using RUE for all daily and work related tasks.    Time 6   Period Weeks   Status New   OT LONG TERM GOAL #2   Title Patient will increase A/ROM of RUE to WNL to increase ability to complete tasks overhead.   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    Time 6   Period Weeks   Status New   OT LONG TERM GOAL #4   Title Patient will decrease  pain in RUE to 2/10 or less during daily tasks.    Time 6   Period Weeks  Status New   OT LONG TERM GOAL #5   Title Patient will decrease fascial restrictions to trace amount to increase functional mobility needed to complete daily tasks.    Time 6   Period Weeks   Status New               Plan - 04/20/15 1401    Clinical Impression Statement A: Patient is a 65 y/o male S/P right shoulder arthroscopy causing increased pain and fascial restrictions and decreased strength and ROM resulting in difficulty completing daily and work related tasks with RUE.    Pt will benefit from skilled therapeutic intervention in order to improve on the following deficits (Retired) Decreased strength;Pain;Impaired UE functional use;Increased fascial restricitons;Decreased range of motion   Rehab Potential Excellent   OT Frequency 2x / week   OT Duration 8 weeks   OT Treatment/Interventions Self-care/ADL training;Ultrasound;Patient/family education;Passive range of motion;Cryotherapy;Electrical Stimulation;Moist Heat;Therapeutic activities;Therapeutic exercises;Manual Therapy   Plan P: Patient requires skilled OT services to increase functional performance using RUE for daily tasks. Treatment plan: myofascial release, P/ROM, AA/ROM, A/ROM, general strengthening.    Consulted and Agree with Plan of Care Patient        Problem List Patient Active Problem List   Diagnosis Date Noted  . Essential hypertension, benign 07/20/2013    Fernando Riley, OTR/L,CBIS  [phone]  04/20/2015, 2:21 PM  [facility] [address] [place], [place], [address] Phone: [phone]   Fax:  [phone]  Name: TEJ KLINDT MRN: [id number] Date of Birth: 1950/09/15

## 2015-04-23 ENCOUNTER — Ambulatory Visit: Payer: Managed Care, Other (non HMO) | Admitting: Family Medicine

## 2015-04-25 ENCOUNTER — Encounter (HOSPITAL_COMMUNITY): Payer: Self-pay

## 2015-04-25 ENCOUNTER — Encounter: Payer: Self-pay | Admitting: Family Medicine

## 2015-04-25 ENCOUNTER — Ambulatory Visit (INDEPENDENT_AMBULATORY_CARE_PROVIDER_SITE_OTHER): Payer: Managed Care, Other (non HMO) | Admitting: Family Medicine

## 2015-04-25 ENCOUNTER — Ambulatory Visit (HOSPITAL_COMMUNITY): Payer: Managed Care, Other (non HMO)

## 2015-04-25 VITALS — BP 136/84 | Ht 71.5 in | Wt 266.2 lb

## 2015-04-25 DIAGNOSIS — M25611 Stiffness of right shoulder, not elsewhere classified: Secondary | ICD-10-CM

## 2015-04-25 DIAGNOSIS — R0683 Snoring: Secondary | ICD-10-CM | POA: Diagnosis not present

## 2015-04-25 DIAGNOSIS — M25511 Pain in right shoulder: Secondary | ICD-10-CM

## 2015-04-25 DIAGNOSIS — R5383 Other fatigue: Secondary | ICD-10-CM | POA: Diagnosis not present

## 2015-04-25 DIAGNOSIS — Z9889 Other specified postprocedural states: Secondary | ICD-10-CM | POA: Diagnosis not present

## 2015-04-25 DIAGNOSIS — R29898 Other symptoms and signs involving the musculoskeletal system: Secondary | ICD-10-CM

## 2015-04-25 NOTE — Progress Notes (Signed)
   Subjective:    Patient ID: Fernando Riley, male    DOB: [birth year], 65 y.o.   MRN: [id number]  HPI  Patient arrives to discuss possible sleep apnea. Patient had shoulder surgery 12/29 and the anesthesiologist stated he had a hard time intubating him and thinks he has sleep apnea.  Post recovery  Potential slee[ apnea due to diffficulty intubating  Of note nurse did not mention major difficulty recovering from anesthesia.  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 This is considered a separate risk factor for presence of sleep apnea. Discussed with patient.  Substantial daytime drowsiness.   Major snoring noted by spells along with periods of apnea Review of Systems No headache no chest pain no back pain no abdominal pain no change in bowel habits    Objective:   Physical Exam  Alert substantial obesity present HEENT thick neck lungs clear. Heart regular in rhythm.      Assessment & Plan:  Impression probable sleep apnea with numerous risk factors discussed at length. 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

## 2015-04-25 NOTE — Therapy (Signed)
[place], [place], [address] Phone: [phone]   Fax:  [phone]  Occupational Therapy Treatment  Patient Details  Name: Fernando Riley MRN: [id number] Date of Birth: 1950-10-12 Referring Provider: Ninfa Linden  Encounter Date: 04/25/2015      OT End of Session - 04/25/15 0948    Visit Number 2   Number of Visits 16   Date for OT Re-Evaluation 06/19/15  mini reassess: 05/18/15   Authorization Type [employer]   Authorization Time Period 60 visit limit. 0 used this year.    Authorization - Visit Number 2   Authorization - Number of Visits 67   OT Start Time [phone]   OT Stop Time 0930   OT Time Calculation (min) 40 min   Activity Tolerance Patient tolerated treatment well   Behavior During Therapy WFL for tasks assessed/performed      Past Medical History  Diagnosis Date  . Cat allergies   . Pollen allergies   . Hypertension   . Kidney stone   . Fatty liver   . Reactive airways dysfunction syndrome   . Impaired fasting glucose   . Venous stasis     Past Surgical History  Procedure Laterality Date  . Knee arthroscopy      x2    There were no vitals filed for this visit.  Visit Diagnosis:  Pain in joint of right shoulder  Shoulder weakness  Shoulder stiffness, right      Subjective Assessment - 04/25/15 0914    Subjective  S: My arm is doing ok.   Currently in Pain? Yes   Pain Score 3    Pain Location Shoulder   Pain Orientation Right   Pain Descriptors / Indicators Aching;Sore   Pain Type Acute pain            [facility] OT Assessment - 04/25/15 0948    Assessment   Diagnosis Right shoulder arthroscopy   Precautions   Precautions Shoulder   Type of Shoulder Precautions Week 1-3: P/ROM, AA/ROM (except horizontal adduction), pendulums Week 4 (05/11/15) and full P/ROM: Progress to A/ROM . Week 8 (06/11/15): progress to strengthening                  OT Treatments/Exercises (OP) -  04/25/15 0920    Exercises   Exercises Shoulder   Shoulder Exercises: Supine   Protraction PROM;10 reps   Horizontal ABduction PROM;10 reps   External Rotation PROM;10 reps   Internal Rotation PROM;10 reps   Flexion PROM;10 reps   ABduction PROM;10 reps   Shoulder Exercises: Seated   Elevation AROM;12 reps   Extension AROM;12 reps   Row AROM;12 reps   Shoulder Exercises: Therapy Ball   Flexion 15 reps   ABduction 15 reps   Shoulder Exercises: ROM/Strengthening   Thumb Tacks 1'   Anterior Glide 3x10"   Caudal Glide 3x10"   Prot/Ret//Elev/Dep 1'   Manual Therapy   Manual Therapy Myofascial release   Manual therapy comments Manual therapy completed prior to exercises.   Myofascial Release Myofascial release and manual stretching completed to Right upper arm, trapezius, and scapularis region to decrease fascial restrictions and increase joint mobility in a pain free zone.                   OT Short Term Goals - 04/25/15 9528    OT SHORT TERM GOAL #1   Title Patient will be educated and independent with a HEP to  increase functional use of RUE during daily tasks.    Time 3   Period Weeks   Status On-going   OT SHORT TERM GOAL #2   Title Patient will increase P/ROM to [facility] to increase ability to get shirts and coats on and off with less difficulty.    Time 3   Period Weeks   Status On-going   OT SHORT TERM GOAL #3   Title Patient will increase RUE strength to 3-/5 to increase ability to complete tasks at waist level usign RUE.    Time 3   Period Weeks   Status On-going   OT SHORT TERM GOAL #4   Title Patient will decrease fascial restrictions to min amount in RUE to increase functional mobility needed during daily tasks.    Time 3   Period Weeks   Status On-going   OT SHORT TERM GOAL #5   Title Patient will decrease pain in RUE during daily tasks to 5/10 or less.   Time 3   Period Weeks   Status On-going           OT Long Term Goals - 04/25/15 7800     OT LONG TERM GOAL #1   Title Patient will return to highest level of independence using RUE for all daily and work related tasks.    Time 6   Period Weeks   Status On-going   OT LONG TERM GOAL #2   Title Patient will increase A/ROM of RUE to WNL to increase ability to complete tasks overhead.   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    Time 6   Period Weeks   Status On-going   OT LONG TERM GOAL #4   Title Patient will decrease pain in RUE to 2/10 or less during daily tasks.    Time 6   Period Weeks   Status On-going   OT LONG TERM GOAL #5   Title Patient will decrease fascial restrictions to trace amount to increase functional mobility needed to complete daily tasks.    Time 6   Period Weeks   Status On-going               Plan - 04/25/15 0948    Clinical Impression Statement A: Initiated myofascial release and manual stretching, P/ROM, and scapular A/ROM exercises. Patient tolerated well requiring VC for form and technique.   Plan P: Add isometrics.        Problem List Patient Active Problem List   Diagnosis Date Noted  . Essential hypertension, benign 07/20/2013    Ailene Ravel, OTR/L,CBIS  [phone]  04/25/2015, 9:51 AM  [facility] [address] [place], [place], [address] Phone: [phone]   Fax:  [phone]  Name: Fernando Riley MRN: [id number] Date of Birth: 1951/02/27

## 2015-04-27 ENCOUNTER — Encounter (HOSPITAL_COMMUNITY): Payer: Self-pay | Admitting: Occupational Therapy

## 2015-04-27 ENCOUNTER — Ambulatory Visit (HOSPITAL_COMMUNITY): Payer: Managed Care, Other (non HMO) | Admitting: Occupational Therapy

## 2015-04-27 DIAGNOSIS — M25511 Pain in right shoulder: Secondary | ICD-10-CM

## 2015-04-27 DIAGNOSIS — R29898 Other symptoms and signs involving the musculoskeletal system: Secondary | ICD-10-CM

## 2015-04-27 DIAGNOSIS — M25611 Stiffness of right shoulder, not elsewhere classified: Secondary | ICD-10-CM

## 2015-04-27 DIAGNOSIS — Z9889 Other specified postprocedural states: Secondary | ICD-10-CM | POA: Diagnosis not present

## 2015-04-27 NOTE — Patient Instructions (Signed)
1) Seated Row   Sit up straight with elbows by your sides. Pull back with shoulders/elbows, keeping forearms straight, as if pulling back on the reins of a horse. Squeeze shoulder blades together. Repeat _10-15__times, _1-2___sets/day    2) Shoulder Elevation    Sit up straight with arms by your sides. Slowly bring your shoulders up towards your ears. Repeat_10-15__times, __1-2__ sets/day    3) Shoulder Extension    Sit up straight with both arms by your side, draw your arms back behind your waist. Keep your elbows straight. Repeat __10-15__times, _1-2___sets/day.        

## 2015-04-27 NOTE — Therapy (Signed)
[place], [place], [address] Phone: [phone]   Fax:  [phone]  Occupational Therapy Treatment  Patient Details  Name: Fernando Riley MRN: [id number] Date of Birth: Nov 16, 1950 Referring Provider: Ninfa Linden  Encounter Date: 04/27/2015      OT End of Session - 04/27/15 1611    Visit Number 3   Number of Visits 16   Date for OT Re-Evaluation 06/19/15  mini reassess: 05/18/15   Authorization Type [employer]   Authorization Time Period 60 visit limit. 0 used this year.    Authorization - Visit Number 3   Authorization - Number of Visits 54   OT Start Time 1350   OT Stop Time 1429   OT Time Calculation (min) 39 min   Activity Tolerance Patient tolerated treatment well   Behavior During Therapy WFL for tasks assessed/performed      Past Medical History  Diagnosis Date  . Cat allergies   . Pollen allergies   . Hypertension   . Kidney stone   . Fatty liver   . Reactive airways dysfunction syndrome   . Impaired fasting glucose   . Venous stasis     Past Surgical History  Procedure Laterality Date  . Knee arthroscopy      x2    There were no vitals filed for this visit.  Visit Diagnosis:  Pain in joint of right shoulder  Shoulder weakness  Shoulder stiffness, right      Subjective Assessment - 04/27/15 1352    Subjective  S: It's been aching like normal.    Currently in Pain? Yes   Pain Score 3    Pain Location Shoulder   Pain Orientation Right   Pain Descriptors / Indicators Aching;Sore   Pain Type Acute pain            [facility] OT Assessment - 04/27/15 1608    Assessment   Diagnosis Right shoulder arthroscopy   Precautions   Precautions Shoulder   Type of Shoulder Precautions Week 1-3: P/ROM, AA/ROM (except horizontal adduction), pendulums Week 4 (05/11/15) and full P/ROM: Progress to A/ROM . Week 8 (06/11/15): progress to strengthening                  OT Treatments/Exercises  (OP) - 04/27/15 1352    Exercises   Exercises Shoulder   Shoulder Exercises: Supine   Protraction PROM;10 reps   Horizontal ABduction PROM;10 reps   Horizontal ABduction Limitations no adduction per protocol   External Rotation PROM;10 reps   Internal Rotation PROM;10 reps   Flexion PROM;10 reps   ABduction PROM;10 reps   Shoulder Exercises: Seated   Elevation AROM;12 reps   Extension AROM;12 reps   Row AROM;12 reps   Shoulder Exercises: Therapy Ball   Flexion 15 reps   ABduction 15 reps   Shoulder Exercises: ROM/Strengthening   Thumb Tacks 1'   Prot/Ret//Elev/Dep 1'   Shoulder Exercises: Isometric Strengthening   Flexion Supine;3X3"   Extension Supine;3X3"   External Rotation Supine;3X3"   Internal Rotation Supine;3X3"   ABduction Supine;3X3"   Manual Therapy   Manual Therapy Myofascial release   Manual therapy comments Manual therapy completed prior to exercises.   Myofascial Release Myofascial release and manual stretching completed to Right upper arm, trapezius, and scapularis region to decrease fascial restrictions and increase joint mobility in a pain free zone.                 OT Education -  04/27/15 1611    Education provided Yes   Education Details scapular A/ROM exercises   Person(s) Educated Patient   Methods Explanation;Demonstration;Handout   Comprehension Verbalized understanding;Returned demonstration          OT Short Term Goals - 04/25/15 0922    OT SHORT TERM GOAL #1   Title Patient will be educated and independent with a HEP to increase functional use of RUE during daily tasks.    Time 3   Period Weeks   Status On-going   OT SHORT TERM GOAL #2   Title Patient will increase P/ROM to [facility] to increase ability to get shirts and coats on and off with less difficulty.    Time 3   Period Weeks   Status On-going   OT SHORT TERM GOAL #3   Title Patient will increase RUE strength to 3-/5 to increase ability to complete tasks at waist level  usign RUE.    Time 3   Period Weeks   Status On-going   OT SHORT TERM GOAL #4   Title Patient will decrease fascial restrictions to min amount in RUE to increase functional mobility needed during daily tasks.    Time 3   Period Weeks   Status On-going   OT SHORT TERM GOAL #5   Title Patient will decrease pain in RUE during daily tasks to 5/10 or less.   Time 3   Period Weeks   Status On-going           OT Long Term Goals - 04/25/15 8032    OT LONG TERM GOAL #1   Title Patient will return to highest level of independence using RUE for all daily and work related tasks.    Time 6   Period Weeks   Status On-going   OT LONG TERM GOAL #2   Title Patient will increase A/ROM of RUE to WNL to increase ability to complete tasks overhead.   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    Time 6   Period Weeks   Status On-going   OT LONG TERM GOAL #4   Title Patient will decrease pain in RUE to 2/10 or less during daily tasks.    Time 6   Period Weeks   Status On-going   OT LONG TERM GOAL #5   Title Patient will decrease fascial restrictions to trace amount to increase functional mobility needed to complete daily tasks.    Time 6   Period Weeks   Status On-going               Plan - 04/27/15 1611    Clinical Impression Statement A: Added isometrics. Provided scapular A/ROM exercises for HEP. Pt required occasional verbal cuing for form & technique during exercises.    Plan P: Add wall wash, increase isometrics to 3x5        Problem List Patient Active Problem List   Diagnosis Date Noted  . Essential hypertension, benign 07/20/2013    Guadelupe Sabin, OTR/L  [phone]  04/27/2015, 4:13 PM  [facility] [address] [place], [place], [address] Phone: [phone]   Fax:  [phone]  Name:  Fernando Riley MRN: [id number] Date of Birth: 1950/10/01

## 2015-04-30 ENCOUNTER — Ambulatory Visit (HOSPITAL_COMMUNITY): Payer: Managed Care, Other (non HMO)

## 2015-04-30 ENCOUNTER — Encounter (HOSPITAL_COMMUNITY): Payer: Self-pay

## 2015-04-30 DIAGNOSIS — M25611 Stiffness of right shoulder, not elsewhere classified: Secondary | ICD-10-CM

## 2015-04-30 DIAGNOSIS — M25511 Pain in right shoulder: Secondary | ICD-10-CM

## 2015-04-30 DIAGNOSIS — Z9889 Other specified postprocedural states: Secondary | ICD-10-CM | POA: Diagnosis not present

## 2015-04-30 DIAGNOSIS — R29898 Other symptoms and signs involving the musculoskeletal system: Secondary | ICD-10-CM

## 2015-04-30 NOTE — Therapy (Signed)
[place], [place], [address] Phone: [phone]   Fax:  [phone]  Occupational Therapy Treatment  Patient Details  Name: Fernando Riley MRN: [id number] Date of Birth: [birth year] Referring Provider: Ninfa Linden  Encounter Date: 04/30/2015      OT End of Session - 04/30/15 8242    Visit Number 4   Number of Visits 16   Date for OT Re-Evaluation 06/19/15  mini reassess: 05/18/15   Authorization Type [employer]   Authorization Time Period 60 visit limit. 0 used this year.    Authorization - Visit Number 4   Authorization - Number of Visits 85   OT Start Time 1300   OT Stop Time 1345   OT Time Calculation (min) 45 min   Activity Tolerance Patient tolerated treatment well   Behavior During Therapy WFL for tasks assessed/performed      Past Medical History  Diagnosis Date  . Cat allergies   . Pollen allergies   . Hypertension   . Kidney stone   . Fatty liver   . Reactive airways dysfunction syndrome   . Impaired fasting glucose   . Venous stasis     Past Surgical History  Procedure Laterality Date  . Knee arthroscopy      x2    There were no vitals filed for this visit.  Visit Diagnosis:  Pain in joint of right shoulder  Shoulder weakness  Shoulder stiffness, right      Subjective Assessment - 04/30/15 1436    Subjective  S: I've been working on my arm. This weekend I might have been more tense and tight.    Currently in Pain? Yes   Pain Score 4    Pain Location Shoulder   Pain Orientation Right   Pain Descriptors / Indicators Aching;Sore   Pain Type Acute pain                      OT Treatments/Exercises (OP) - 04/30/15 1325    Exercises   Exercises Shoulder   Shoulder Exercises: Supine   Protraction PROM;10 reps   Horizontal ABduction PROM;10 reps   Horizontal ABduction Limitations no adduction per protocol   External Rotation PROM;10 reps   Internal Rotation PROM;10 reps    Flexion PROM;10 reps   ABduction PROM;10 reps   Shoulder Exercises: Seated   Elevation AROM;15 reps   Extension AROM;15 reps   Row AROM;15 reps   Shoulder Exercises: Pulleys   Flexion 1 minute   ABduction 1 minute   Shoulder Exercises: Therapy Ball   Flexion 20 reps   ABduction 20 reps   Shoulder Exercises: ROM/Strengthening   Wall Wash 1' flexion and 1' abduction   Thumb Tacks 1'   Anterior Glide 3x10"   Prot/Ret//Elev/Dep 1'   Shoulder Exercises: Isometric Strengthening   Flexion Supine;3X5"   Extension Supine;3X5"   External Rotation Supine;3X5"   Internal Rotation Supine;3X5"   ABduction Supine;3X5"   Manual Therapy   Manual Therapy Myofascial release   Manual therapy comments Manual therapy completed prior to exercises.   Myofascial Release Myofascial release and manual stretching completed to Right upper arm, trapezius, and scapularis region to decrease fascial restrictions and increase joint mobility in a pain free zone.                   OT Short Term Goals - 04/25/15 3536    OT SHORT TERM GOAL #1   Title Patient will  be educated and independent with a HEP to increase functional use of RUE during daily tasks.    Time 3   Period Weeks   Status On-going   OT SHORT TERM GOAL #2   Title Patient will increase P/ROM to [facility] to increase ability to get shirts and coats on and off with less difficulty.    Time 3   Period Weeks   Status On-going   OT SHORT TERM GOAL #3   Title Patient will increase RUE strength to 3-/5 to increase ability to complete tasks at waist level usign RUE.    Time 3   Period Weeks   Status On-going   OT SHORT TERM GOAL #4   Title Patient will decrease fascial restrictions to min amount in RUE to increase functional mobility needed during daily tasks.    Time 3   Period Weeks   Status On-going   OT SHORT TERM GOAL #5   Title Patient will decrease pain in RUE during daily tasks to 5/10 or less.   Time 3   Period Weeks   Status  On-going           OT Long Term Goals - 04/25/15 7680    OT LONG TERM GOAL #1   Title Patient will return to highest level of independence using RUE for all daily and work related tasks.    Time 6   Period Weeks   Status On-going   OT LONG TERM GOAL #2   Title Patient will increase A/ROM of RUE to WNL to increase ability to complete tasks overhead.   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    Time 6   Period Weeks   Status On-going   OT LONG TERM GOAL #4   Title Patient will decrease pain in RUE to 2/10 or less during daily tasks.    Time 6   Period Weeks   Status On-going   OT LONG TERM GOAL #5   Title Patient will decrease fascial restrictions to trace amount to increase functional mobility needed to complete daily tasks.    Time 6   Period Weeks   Status On-going               Plan - 04/30/15 1437    Clinical Impression Statement A: Added wall wash, pulleys, and increased isometrics with VC for form and technique.    Plan P: Continue to work on increasing ROM during manual stretching that is needed during functnional reaching tasks.         Problem List Patient Active Problem List   Diagnosis Date Noted  . Essential hypertension, benign 07/20/2013    Ailene Ravel, OTR/L,CBIS  [phone]  04/30/2015, 2:39 PM  [facility] [address] [place], [place], [address] Phone: [phone]   Fax:  [phone]  Name: Fernando Riley MRN: [id number] Date of Birth: [birth year]

## 2015-05-01 ENCOUNTER — Telehealth: Payer: Self-pay | Admitting: Family Medicine

## 2015-05-01 MED ORDER — SULFACETAMIDE SODIUM 10 % OP SOLN: OPHTHALMIC | Status: DC

## 2015-05-01 NOTE — Telephone Encounter (Signed)
Discussed with patient. Sulfacetamide drops sent to pharmacy.

## 2015-05-01 NOTE — Telephone Encounter (Signed)
Pt is having issues with his eye, its red, swollen, oozing/crusty in the am Itchy   No allergies to sulfa drugs    [place] reids

## 2015-05-02 ENCOUNTER — Ambulatory Visit (HOSPITAL_COMMUNITY): Payer: Managed Care, Other (non HMO) | Admitting: Specialist

## 2015-05-02 DIAGNOSIS — M25511 Pain in right shoulder: Secondary | ICD-10-CM

## 2015-05-02 DIAGNOSIS — R29898 Other symptoms and signs involving the musculoskeletal system: Secondary | ICD-10-CM

## 2015-05-02 DIAGNOSIS — M25611 Stiffness of right shoulder, not elsewhere classified: Secondary | ICD-10-CM

## 2015-05-02 DIAGNOSIS — Z9889 Other specified postprocedural states: Secondary | ICD-10-CM | POA: Diagnosis not present

## 2015-05-02 NOTE — Therapy (Signed)
[place], [place], [address] Phone: [phone]   Fax:  [phone]  Occupational Therapy Treatment  Patient Details  Name: Fernando Riley MRN: [id number] Date of Birth: [birth year] Referring Provider: Ninfa Linden  Encounter Date: 05/02/2015      OT End of Session - 05/02/15 0927    Visit Number 5   Number of Visits 16   Date for OT Re-Evaluation 06/19/15  mini reassessment on 05/18/15   Authorization Type [employer]   Authorization Time Period 60 visit limit. 0 used this year.    Authorization - Visit Number 5   Authorization - Number of Visits 11   OT Start Time 0845   OT Stop Time 0930   OT Time Calculation (min) 45 min   Activity Tolerance Patient tolerated treatment well   Behavior During Therapy WFL for tasks assessed/performed      Past Medical History  Diagnosis Date  . Cat allergies   . Pollen allergies   . Hypertension   . Kidney stone   . Fatty liver   . Reactive airways dysfunction syndrome   . Impaired fasting glucose   . Venous stasis     Past Surgical History  Procedure Laterality Date  . Knee arthroscopy      x2    There were no vitals filed for this visit.  Visit Diagnosis:  Shoulder stiffness, right  Shoulder weakness  Pain in joint of right shoulder      Subjective Assessment - 05/02/15 0849    Subjective  S:  I am doing well.   Currently in Pain? Yes   Pain Score 4    Pain Location Shoulder   Pain Orientation Right   Pain Descriptors / Indicators Aching;Sore   Pain Type Acute pain            [facility] OT Assessment - 05/02/15 0001    Assessment   Diagnosis Right shoulder arthroscopy   Precautions   Precautions Shoulder   Type of Shoulder Precautions Week 1-3: P/ROM, AA/ROM (except horizontal adduction), pendulums Week 4 (05/11/15) and full P/ROM: Progress to A/ROM . Week 8 (06/11/15): progress to strengthening                  OT Treatments/Exercises (OP) -  05/02/15 0001    Exercises   Exercises Shoulder   Shoulder Exercises: Supine   Protraction PROM;AAROM;10 reps   Horizontal ABduction PROM;10 reps   Horizontal ABduction Limitations no adduction per protocol   External Rotation PROM;AAROM;10 reps   Internal Rotation PROM;AAROM;10 reps   Flexion PROM;AAROM;10 reps   ABduction PROM;AAROM;10 reps   Shoulder Exercises: Seated   Elevation AROM;15 reps   Extension AROM;15 reps   Row AROM;15 reps   Shoulder Exercises: Pulleys   Flexion 2 minutes   ABduction 2 minutes   Shoulder Exercises: ROM/Strengthening   Wall Wash 90" flexion and 90" abduction   Thumb Tacks 1'   Prot/Ret//Elev/Dep 1'   Shoulder Exercises: Isometric Strengthening   Flexion Supine;5X5"   Extension Supine;5X5"   External Rotation Supine;5X5"   Internal Rotation Supine;5X5"   ABduction Supine;5X5"   ADduction Supine;5X5"   Manual Therapy   Manual Therapy Myofascial release   Manual therapy comments Manual therapy completed prior to exercises.   Myofascial Release Myofascial release and manual stretching completed to Right upper arm, trapezius, and scapularis region to decrease fascial restrictions and increase joint mobility in a pain free zone.  OT Short Term Goals - 04/25/15 2947    OT SHORT TERM GOAL #1   Title Patient will be educated and independent with a HEP to increase functional use of RUE during daily tasks.    Time 3   Period Weeks   Status On-going   OT SHORT TERM GOAL #2   Title Patient will increase P/ROM to [facility] to increase ability to get shirts and coats on and off with less difficulty.    Time 3   Period Weeks   Status On-going   OT SHORT TERM GOAL #3   Title Patient will increase RUE strength to 3-/5 to increase ability to complete tasks at waist level usign RUE.    Time 3   Period Weeks   Status On-going   OT SHORT TERM GOAL #4   Title Patient will decrease fascial restrictions to min amount in RUE to increase  functional mobility needed during daily tasks.    Time 3   Period Weeks   Status On-going   OT SHORT TERM GOAL #5   Title Patient will decrease pain in RUE during daily tasks to 5/10 or less.   Time 3   Period Weeks   Status On-going           OT Long Term Goals - 04/25/15 6546    OT LONG TERM GOAL #1   Title Patient will return to highest level of independence using RUE for all daily and work related tasks.    Time 6   Period Weeks   Status On-going   OT LONG TERM GOAL #2   Title Patient will increase A/ROM of RUE to WNL to increase ability to complete tasks overhead.   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    Time 6   Period Weeks   Status On-going   OT LONG TERM GOAL #4   Title Patient will decrease pain in RUE to 2/10 or less during daily tasks.    Time 6   Period Weeks   Status On-going   OT LONG TERM GOAL #5   Title Patient will decrease fascial restrictions to trace amount to increase functional mobility needed to complete daily tasks.    Time 6   Period Weeks   Status On-going               Plan - 05/02/15 5035    Clinical Impression Statement A:  Added AA/ROM in supine as P/ROM was greater than 80%.  Patient improved range with wall wash this date.   Plan P:  improve abduction P/ROM to [facility] in supine for improved ability to reach out to his side during functional activities.         Problem List Patient Active Problem List   Diagnosis Date Noted  . Essential hypertension, benign 07/20/2013    Vangie Bicker, OTR/L [phone]  05/02/2015, 9:32 AM  [facility] [address] [place], [place], [address] Phone: [phone]   Fax:  [phone]  Name: Fernando Riley MRN: [id number] Date of Birth: 1950-10-07

## 2015-05-08 ENCOUNTER — Encounter (HOSPITAL_COMMUNITY): Payer: Self-pay | Admitting: Occupational Therapy

## 2015-05-08 ENCOUNTER — Ambulatory Visit (HOSPITAL_COMMUNITY): Payer: Managed Care, Other (non HMO) | Admitting: Occupational Therapy

## 2015-05-08 DIAGNOSIS — Z9889 Other specified postprocedural states: Secondary | ICD-10-CM | POA: Diagnosis not present

## 2015-05-08 DIAGNOSIS — M25611 Stiffness of right shoulder, not elsewhere classified: Secondary | ICD-10-CM

## 2015-05-08 DIAGNOSIS — R29898 Other symptoms and signs involving the musculoskeletal system: Secondary | ICD-10-CM

## 2015-05-08 DIAGNOSIS — M25511 Pain in right shoulder: Secondary | ICD-10-CM

## 2015-05-08 NOTE — Therapy (Signed)
[place], [place], [address] Phone: [phone]   Fax:  [phone]  Occupational Therapy Treatment  Patient Details  Name: Fernando Riley MRN: [id number] Date of Birth: 11/16/1950 Referring Provider: Ninfa Linden  Encounter Date: 05/08/2015      OT End of Session - 05/08/15 1106    Visit Number 6   Number of Visits 16   Date for OT Re-Evaluation 06/19/15  mini reassessment on 05/18/15   Authorization Type [employer]   Authorization Time Period 60 visit limit. 0 used this year.    Authorization - Visit Number 6   Authorization - Number of Visits 21   OT Start Time [phone]   OT Stop Time 1015   OT Time Calculation (min) 44 min   Activity Tolerance Patient tolerated treatment well   Behavior During Therapy WFL for tasks assessed/performed      Past Medical History  Diagnosis Date  . Cat allergies   . Pollen allergies   . Hypertension   . Kidney stone   . Fatty liver   . Reactive airways dysfunction syndrome   . Impaired fasting glucose   . Venous stasis     Past Surgical History  Procedure Laterality Date  . Knee arthroscopy      x2    There were no vitals filed for this visit.  Visit Diagnosis:  Shoulder stiffness, right  Shoulder weakness  Pain in joint of right shoulder      Subjective Assessment - 05/08/15 0932    Subjective  S: No pain to speak of today.    Currently in Pain? No/denies            [facility] OT Assessment - 05/08/15 0933    Assessment   Diagnosis Right shoulder arthroscopy   Precautions   Precautions Shoulder   Type of Shoulder Precautions Week 1-3: P/ROM, AA/ROM (except horizontal adduction), pendulums Week 4 (05/11/15) and full P/ROM: Progress to A/ROM . Week 8 (06/11/15): progress to strengthening                  OT Treatments/Exercises (OP) - 05/08/15 0933    Exercises   Exercises Shoulder   Shoulder Exercises: Supine   Protraction PROM;5 reps;AAROM;12 reps    Horizontal ABduction PROM;5 reps;AAROM;12 reps   Horizontal ABduction Limitations no adduction per protocol   External Rotation PROM;5 reps;AAROM;12 reps   Internal Rotation PROM;5 reps;AAROM;12 reps   Flexion PROM;5 reps;AAROM;12 reps   ABduction PROM;5 reps;AAROM;12 reps   Shoulder Exercises: Seated   Elevation AROM;15 reps   Extension AROM;15 reps   Row AROM;15 reps   Shoulder Exercises: Standing   Protraction AAROM;10 reps   Horizontal ABduction AAROM;10 reps   Horizontal ABduction Limitations no adduction   External Rotation AAROM;10 reps   Internal Rotation AAROM;10 reps   Flexion AAROM;10 reps   ABduction AAROM;10 reps   Shoulder Exercises: Pulleys   Flexion 1 minute   ABduction 1 minute   Shoulder Exercises: ROM/Strengthening   Wall Wash 90" flexion and 90" abduction   Manual Therapy   Manual Therapy Myofascial release   Manual therapy comments Manual therapy completed prior to exercises.   Myofascial Release Myofascial release and manual stretching completed to Right upper arm, trapezius, and scapularis region to decrease fascial restrictions and increase joint mobility in a pain free zone.                   OT Short Term Goals -  04/25/15 0922    OT SHORT TERM GOAL #1   Title Patient will be educated and independent with a HEP to increase functional use of RUE during daily tasks.    Time 3   Period Weeks   Status On-going   OT SHORT TERM GOAL #2   Title Patient will increase P/ROM to [facility] to increase ability to get shirts and coats on and off with less difficulty.    Time 3   Period Weeks   Status On-going   OT SHORT TERM GOAL #3   Title Patient will increase RUE strength to 3-/5 to increase ability to complete tasks at waist level usign RUE.    Time 3   Period Weeks   Status On-going   OT SHORT TERM GOAL #4   Title Patient will decrease fascial restrictions to min amount in RUE to increase functional mobility needed during daily tasks.    Time 3    Period Weeks   Status On-going   OT SHORT TERM GOAL #5   Title Patient will decrease pain in RUE during daily tasks to 5/10 or less.   Time 3   Period Weeks   Status On-going           OT Long Term Goals - 04/25/15 [id number]    OT LONG TERM GOAL #1   Title Patient will return to highest level of independence using RUE for all daily and work related tasks.    Time 6   Period Weeks   Status On-going   OT LONG TERM GOAL #2   Title Patient will increase A/ROM of RUE to WNL to increase ability to complete tasks overhead.   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    Time 6   Period Weeks   Status On-going   OT LONG TERM GOAL #4   Title Patient will decrease pain in RUE to 2/10 or less during daily tasks.    Time 6   Period Weeks   Status On-going   OT LONG TERM GOAL #5   Title Patient will decrease fascial restrictions to trace amount to increase functional mobility needed to complete daily tasks.    Time 6   Period Weeks   Status On-going               Plan - 05/08/15 1106    Clinical Impression Statement A: Added AA/ROM in standing, P/ROM WNL in all ranges this session. Pt reports minimal fatigue at end of session, no increased pain. Pt required verbal cuing for form and technique during exercises.    Plan P: Add proximal shoulder strengthening in supine, provide AA/ROM HEP.         Problem List Patient Active Problem List   Diagnosis Date Noted  . Essential hypertension, benign 07/20/2013    Guadelupe Sabin, OTR/L  [phone]  05/08/2015, 11:08 AM  [place], [place], [address] Phone: [phone]   Fax:  [phone]  Name: Fernando Riley MRN: [id number] Date of Birth: 1951-03-14

## 2015-05-10 ENCOUNTER — Ambulatory Visit (HOSPITAL_COMMUNITY): Payer: Managed Care, Other (non HMO) | Admitting: Occupational Therapy

## 2015-05-10 ENCOUNTER — Encounter (HOSPITAL_COMMUNITY): Payer: Self-pay | Admitting: Occupational Therapy

## 2015-05-10 DIAGNOSIS — Z9889 Other specified postprocedural states: Secondary | ICD-10-CM | POA: Diagnosis not present

## 2015-05-10 DIAGNOSIS — R29898 Other symptoms and signs involving the musculoskeletal system: Secondary | ICD-10-CM

## 2015-05-10 DIAGNOSIS — M25611 Stiffness of right shoulder, not elsewhere classified: Secondary | ICD-10-CM

## 2015-05-10 DIAGNOSIS — M25511 Pain in right shoulder: Secondary | ICD-10-CM

## 2015-05-10 NOTE — Therapy (Signed)
[place], [place], [address] Phone: [phone]   Fax:  [phone]  Occupational Therapy Treatment  Patient Details  Name: Fernando Riley MRN: [id number] Date of Birth: [birth year] Referring Provider: Ninfa Linden  Encounter Date: 05/10/2015      OT End of Session - 05/10/15 [id number]    Visit Number 7   Number of Visits 16   Date for OT Re-Evaluation 06/19/15  mini reassessment on 05/18/15   Authorization Type [employer]   Authorization Time Period 60 visit limit. 0 used this year.    Authorization - Visit Number 7   Authorization - Number of Visits 31   OT Start Time 1102   OT Stop Time 1142   OT Time Calculation (min) 40 min   Activity Tolerance Patient tolerated treatment well   Behavior During Therapy WFL for tasks assessed/performed      Past Medical History  Diagnosis Date  . Cat allergies   . Pollen allergies   . Hypertension   . Kidney stone   . Fatty liver   . Reactive airways dysfunction syndrome   . Impaired fasting glucose   . Venous stasis     Past Surgical History  Procedure Laterality Date  . Knee arthroscopy      x2    There were no vitals filed for this visit.  Visit Diagnosis:  Shoulder stiffness, right  Shoulder weakness  Pain in joint of right shoulder      Subjective Assessment - 05/10/15 1103    Subjective  S: It was tired and sore after last session, but no real pain.    Currently in Pain? No/denies            [facility] OT Assessment - 05/10/15 1103    Assessment   Diagnosis Right shoulder arthroscopy   Precautions   Precautions Shoulder   Type of Shoulder Precautions Week 1-3: P/ROM, AA/ROM (except horizontal adduction), pendulums Week 4 (05/11/15) and full P/ROM: Progress to A/ROM . Week 8 (06/11/15): progress to strengthening                  OT Treatments/Exercises (OP) - 05/10/15 1105    Exercises   Exercises Shoulder   Shoulder Exercises: Supine   Protraction PROM;5 reps;AAROM;12 reps   Horizontal ABduction PROM;5 reps;AAROM;12 reps   Horizontal ABduction Limitations no adduction per protocol   External Rotation PROM;5 reps;AAROM;12 reps   Internal Rotation PROM;5 reps;AAROM;12 reps   Flexion PROM;5 reps;AAROM;12 reps   ABduction PROM;5 reps;AAROM;12 reps   Shoulder Exercises: Seated   Elevation AROM;15 reps   Extension AROM;15 reps   Row AROM;15 reps   Shoulder Exercises: Standing   Protraction AAROM;10 reps   Horizontal ABduction AAROM;10 reps   Horizontal ABduction Limitations no adduction   External Rotation AAROM;10 reps   Internal Rotation AAROM;10 reps   Flexion AAROM;10 reps   ABduction AAROM;10 reps   Shoulder Exercises: Therapy Ball   Right/Left --  3 reps each direction   Shoulder Exercises: ROM/Strengthening   Rhythmic Stabilization, Seated 25X at 90 and 120 degrees. Min difficulty at 90 degrees, mod difficulty at 120 degrees   Manual Therapy   Manual Therapy Myofascial release   Manual therapy comments Manual therapy completed prior to exercises.   Myofascial Release Myofascial release and manual stretching completed to Right upper arm, trapezius, and scapularis region to decrease fascial restrictions and increase joint mobility in a pain free zone.  OT Education - 05/10/15 1140    Education provided Yes   Education Details AA/ROM exercises    Person(s) Educated Patient   Methods Explanation;Demonstration;Handout   Comprehension Verbalized understanding;Returned demonstration          OT Short Term Goals - 04/25/15 0922    OT SHORT TERM GOAL #1   Title Patient will be educated and independent with a HEP to increase functional use of RUE during daily tasks.    Time 3   Period Weeks   Status On-going   OT SHORT TERM GOAL #2   Title Patient will increase P/ROM to [facility] to increase ability to get shirts and coats on and off with less difficulty.    Time 3   Period Weeks   Status  On-going   OT SHORT TERM GOAL #3   Title Patient will increase RUE strength to 3-/5 to increase ability to complete tasks at waist level usign RUE.    Time 3   Period Weeks   Status On-going   OT SHORT TERM GOAL #4   Title Patient will decrease fascial restrictions to min amount in RUE to increase functional mobility needed during daily tasks.    Time 3   Period Weeks   Status On-going   OT SHORT TERM GOAL #5   Title Patient will decrease pain in RUE during daily tasks to 5/10 or less.   Time 3   Period Weeks   Status On-going           OT Long Term Goals - 04/25/15 [id number]    OT LONG TERM GOAL #1   Title Patient will return to highest level of independence using RUE for all daily and work related tasks.    Time 6   Period Weeks   Status On-going   OT LONG TERM GOAL #2   Title Patient will increase A/ROM of RUE to WNL to increase ability to complete tasks overhead.   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    Time 6   Period Weeks   Status On-going   OT LONG TERM GOAL #4   Title Patient will decrease pain in RUE to 2/10 or less during daily tasks.    Time 6   Period Weeks   Status On-going   OT LONG TERM GOAL #5   Title Patient will decrease fascial restrictions to trace amount to increase functional mobility needed to complete daily tasks.    Time 6   Period Weeks   Status On-going               Plan - 05/10/15 1149    Clinical Impression Statement A: Added therapy ball circles, pt completed all exercises with good form. Pt reports minimal fatigue at end of session, was provided with AA/ROM HEP. Pt has achieved full P/ROM and ROM WNL during AA/ROM exercises.    Plan P: Find out date of MD appt, mini-reassess if before next therapy session. Add A/ROM in supine if able to tolerate, add proximal shoulder strengthening.         Problem  List Patient Active Problem List   Diagnosis Date Noted  . Essential hypertension, benign 07/20/2013   Guadelupe Sabin, OTR/L  [phone]  05/10/2015, 11:52 AM  [facility] [address]  [place], [place], [address] Phone: [phone]   Fax:  [phone]  Name: Fernando Riley MRN: [id number] Date of Birth: 09-09-1950

## 2015-05-10 NOTE — Patient Instructions (Signed)

## 2015-05-14 ENCOUNTER — Ambulatory Visit (HOSPITAL_COMMUNITY): Payer: Managed Care, Other (non HMO) | Admitting: Specialist

## 2015-05-14 DIAGNOSIS — R29898 Other symptoms and signs involving the musculoskeletal system: Secondary | ICD-10-CM

## 2015-05-14 DIAGNOSIS — M25611 Stiffness of right shoulder, not elsewhere classified: Secondary | ICD-10-CM

## 2015-05-14 DIAGNOSIS — Z9889 Other specified postprocedural states: Secondary | ICD-10-CM | POA: Diagnosis not present

## 2015-05-14 NOTE — Patient Instructions (Signed)
COMPLETE EACH EXERCISE 10 TIMES EACH, 2-3 TIMES PER DAY.  INITIALLY COMPLETE LYING DOWN AND PROGRESS TO STANDING AS YOU ARE ABLE  ROM: Abduction (Standing)   Bring arms straight out from sides and raise as high as possible without pain. Repeat ____ times per set. Do ____ sets per session. Do ____ sessions per day.  http://orth.exer.us/910   Copyright  VHI. All rights reserved.   Extension (Active) ROM: Extension (Standing)   Bring arms straight back as far as possible without pain. Repeat ____ times per set. Do ____ sets per session. Do ____ sessions per day.  http://orth.exer.us/916   Copyright  VHI. All rights reserved.   ROM: External / Internal Rotation - in Abduction (Standing)   With upper arms parallel to floor and elbows bent at right angles, gently rotate arms up then down as far as possible without pain. Repeat ____ times per set. Do ____ sets per session. Do ____ sessions per day.  http://orth.exer.us/912   Copyright  VHI. All rights reserved.    Flexors Stretch (Active)   Stand, arms straight at sides. Bring arms straight forward and upward as high as possible without pain. Hold ___ seconds. Repeat ___ times per session. Do ___ sessions per day.  Copyright  VHI. All rights reserved.   Scapular Retraction (Standing)   With arms at sides, pinch shoulder blades together. Repeat ____ times per set. Do ____ sets per session. Do ____ sessions per day.  http://orth.exer.us/944   Copyright  VHI. All rights reserved.

## 2015-05-14 NOTE — Therapy (Signed)
[facility] [place], [place], [address] Phone: [phone]   Fax:  [phone]  Occupational Therapy Treatment  Patient Details  Name: Fernando Riley MRN: [id number] Date of Birth: 1950-08-19 Referring Provider: Dr. Jean Rosenthal  Encounter Date: 05/14/2015      OT End of Session - 05/14/15 1653    Visit Number 8   Number of Visits 16   Date for OT Re-Evaluation 06/19/15  mini reassess on 05/15/15   Authorization Type [employer]   Authorization Time Period 60 visit limit. 0 used this year.    Authorization - Visit Number 8   Authorization - Number of Visits 14   OT Start Time [id number]   OT Stop Time 1510   OT Time Calculation (min) 38 min   Activity Tolerance Patient tolerated treatment well   Behavior During Therapy WFL for tasks assessed/performed      Past Medical History  Diagnosis Date  . Cat allergies   . Pollen allergies   . Hypertension   . Kidney stone   . Fatty liver   . Reactive airways dysfunction syndrome   . Impaired fasting glucose   . Venous stasis     Past Surgical History  Procedure Laterality Date  . Knee arthroscopy      x2    There were no vitals filed for this visit.  Visit Diagnosis:  Shoulder stiffness, right  Shoulder weakness      Subjective Assessment - 05/14/15 1433    Subjective  S:  Its coming along well.  I go back to the MD on Wedesday.   Currently in Pain? No/denies            [facility] OT Assessment - 05/14/15 0001    Assessment   Diagnosis Right shoulder arthroscopy   Referring Provider Dr. Jean Rosenthal   Precautions   Precautions Shoulder   Type of Shoulder Precautions Week 1-3: P/ROM, AA/ROM (except horizontal adduction), pendulums Week 4 (05/11/15) and full P/ROM: Progress to A/ROM . Week 8 (06/11/15): progress to strengthening                  OT Treatments/Exercises (OP) - 05/14/15 0001    Exercises   Exercises Shoulder   Shoulder  Exercises: Supine   Protraction PROM;5 reps;AROM;10 reps   Horizontal ABduction PROM;5 reps;AROM;10 reps   External Rotation PROM;5 reps;AROM;10 reps   Internal Rotation PROM;5 reps;AROM;10 reps   Flexion PROM;5 reps;AROM;10 reps   ABduction PROM;5 reps;AROM;10 reps   Shoulder Exercises: Seated   Protraction AROM;10 reps   External Rotation AROM;10 reps  ARM ABDUCTED TO 90   Internal Rotation AROM;10 reps   Internal Rotation Limitations ARM abductd to 90   Flexion AROM;10 reps   Abduction AROM;10 reps   Shoulder Exercises: Standing   Extension Theraband;10 reps   Theraband Level (Shoulder Extension) Level 2 (Red)   Row Theraband;10 reps   Theraband Level (Shoulder Row) Level 2 (Red)   Retraction Theraband;10 reps   Theraband Level (Shoulder Retraction) Level 2 (Red)   Shoulder Exercises: ROM/Strengthening   Wall [place] 90" flexion and 90" abduction   Proximal Shoulder Strengthening, Seated 10 times each without resting   Manual Therapy   Manual Therapy Myofascial release   Manual therapy comments Manual therapy completed prior to exercises.   Myofascial Release Myofascial release and manual stretching completed to Right upper arm, trapezius, and scapularis region to decrease fascial restrictions and increase joint mobility in a pain free  zone.                 OT Education - 05/14/15 1652    Education provided Yes   Education Details A/ROM in supine transitioning to standing    Person(s) Educated Patient   Methods Explanation;Demonstration;Handout   Comprehension Verbalized understanding;Returned demonstration          OT Short Term Goals - 04/25/15 0922    OT SHORT TERM GOAL #1   Title Patient will be educated and independent with a HEP to increase functional use of RUE during daily tasks.    Time 3   Period Weeks   Status On-going   OT SHORT TERM GOAL #2   Title Patient will increase P/ROM to [facility] to increase ability to get shirts and coats on and off with less  difficulty.    Time 3   Period Weeks   Status On-going   OT SHORT TERM GOAL #3   Title Patient will increase RUE strength to 3-/5 to increase ability to complete tasks at waist level usign RUE.    Time 3   Period Weeks   Status On-going   OT SHORT TERM GOAL #4   Title Patient will decrease fascial restrictions to min amount in RUE to increase functional mobility needed during daily tasks.    Time 3   Period Weeks   Status On-going   OT SHORT TERM GOAL #5   Title Patient will decrease pain in RUE during daily tasks to 5/10 or less.   Time 3   Period Weeks   Status On-going           OT Long Term Goals - 04/25/15 [id number]    OT LONG TERM GOAL #1   Title Patient will return to highest level of independence using RUE for all daily and work related tasks.    Time 6   Period Weeks   Status On-going   OT LONG TERM GOAL #2   Title Patient will increase A/ROM of RUE to WNL to increase ability to complete tasks overhead.   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    Time 6   Period Weeks   Status On-going   OT LONG TERM GOAL #4   Title Patient will decrease pain in RUE to 2/10 or less during daily tasks.    Time 6   Period Weeks   Status On-going   OT LONG TERM GOAL #5   Title Patient will decrease fascial restrictions to trace amount to increase functional mobility needed to complete daily tasks.    Time 6   Period Weeks   Status On-going               Plan - 05/14/15 1653    Clinical Impression Statement A:  Patient able to complete A/ROM in supine and seated this date with verbal guidance to depress shoulder blade while completing seated A/ROM.  Added proximal shoulder strengthening for improved proximal shoulder strength needed for return to functional activities.    Plan P:  Reassess for MD appointment.  Increase repetitions with A/ROM in  supine and seated.  Add x to v and w arms and overhead lacing.        Problem List Patient Active Problem List   Diagnosis Date Noted  . Essential hypertension,  benign 07/20/2013    Vangie Bicker, OTR/L [phone]  05/14/2015, 4:56 PM  [facility] [address] [place], [place], [address] Phone: [phone]   Fax:  [phone]  Name: Fernando Riley MRN: [id number] Date of Birth: [birth year]

## 2015-05-15 ENCOUNTER — Encounter (HOSPITAL_COMMUNITY): Payer: Self-pay

## 2015-05-15 ENCOUNTER — Ambulatory Visit (HOSPITAL_COMMUNITY): Payer: Managed Care, Other (non HMO)

## 2015-05-15 DIAGNOSIS — R29898 Other symptoms and signs involving the musculoskeletal system: Secondary | ICD-10-CM

## 2015-05-15 DIAGNOSIS — M25611 Stiffness of right shoulder, not elsewhere classified: Secondary | ICD-10-CM

## 2015-05-15 DIAGNOSIS — Z9889 Other specified postprocedural states: Secondary | ICD-10-CM | POA: Diagnosis not present

## 2015-05-15 NOTE — Therapy (Addendum)
[place], [place], [address] Phone: [phone]   Fax:  [phone]  Occupational Therapy Treatment and mini reassessment  Patient Details  Name: KEONA SHEFFLER MRN: [id number] Date of Birth: Apr 22, 1950 Referring Provider: Dr. Jean Rosenthal  Encounter Date: 05/15/2015      OT End of Session - 05/15/15 0934    Visit Number 9   Number of Visits 16   Date for OT Re-Evaluation 06/19/15  mini reassess on 05/15/15   Authorization Type [employer]   Authorization Time Period 60 visit limit. 0 used this year.    Authorization - Visit Number 9   Authorization - Number of Visits 11   OT Start Time [phone]  reassessment   OT Stop Time 0930   OT Time Calculation (min) 40 min   Activity Tolerance Patient tolerated treatment well   Behavior During Therapy WFL for tasks assessed/performed      Past Medical History  Diagnosis Date  . Cat allergies   . Pollen allergies   . Hypertension   . Kidney stone   . Fatty liver   . Reactive airways dysfunction syndrome   . Impaired fasting glucose   . Venous stasis     Past Surgical History  Procedure Laterality Date  . Knee arthroscopy      x2    There were no vitals filed for this visit.  Visit Diagnosis:  Shoulder stiffness, right  Shoulder weakness      Subjective Assessment - 05/15/15 0913    Subjective  S: Everything's gotten better since I've been coming.    Currently in Pain? No/denies            [facility] OT Assessment - 05/15/15 0850    Assessment   Diagnosis Right shoulder arthroscopy   Precautions   Precautions Shoulder   Type of Shoulder Precautions Week 1-3: P/ROM, AA/ROM (except horizontal adduction), pendulums Week 4 (05/11/15) and full P/ROM: Progress to A/ROM . Week 8 (06/11/15): progress to strengthening   AROM   Overall AROM Comments Assessed supine then standing (Standing not assessed on eval). IR/er adducted.   AROM Assessment Site Shoulder    Right/Left Shoulder Right   Right Shoulder Flexion 163 Degrees  previous: 30(supine) 150 (seated 05/15/15)   Right Shoulder ABduction 180 Degrees  previous: 0( supine) 155 (seated 05/15/15)   Right Shoulder Internal Rotation 90 Degrees  previous: 60(supine) 90 (seated 05/15/15)   Right Shoulder External Rotation 90 Degrees  previous: 49(supine) 71 (seated 05/15/15)   PROM   Overall PROM Comments Assessed supine. IR/er adducted.   PROM Assessment Site Shoulder   Right/Left Shoulder Right   Right Shoulder Flexion 165 Degrees  previous: 90   Right Shoulder ABduction 180 Degrees  previous: 70   Right Shoulder Internal Rotation 90 Degrees  previous: 90   Right Shoulder External Rotation 90 Degrees  previous: 45   Strength   Overall Strength Comments Assessed for the first time today   Strength Assessment Site Shoulder   Right/Left Shoulder Right   Right Shoulder Flexion 3+/5   Right Shoulder ABduction 3+/5   Right Shoulder Internal Rotation 3+/5   Right Shoulder External Rotation 3+/5                  OT Treatments/Exercises (OP) - 05/15/15 0914    Exercises   Exercises Shoulder   Shoulder Exercises: Supine   Protraction PROM;5 reps;AROM;12 reps   Horizontal ABduction PROM;5 reps;AROM;12 reps  External Rotation PROM;5 reps;AROM;12 reps   External Rotation Limitations shoulder abducted   Internal Rotation PROM;5 reps;AROM;12 reps   Internal Rotation Limitations shoulder abducted   Flexion PROM;5 reps;AROM;12 reps   ABduction PROM;5 reps;AROM;12 reps   Shoulder Exercises: Prone   Other Prone Exercises [place] exercises; 10X   Shoulder Exercises: ROM/Strengthening   Over Head Lace 2'   "W" Arms 12X   X to V Arms 12X   Proximal Shoulder Strengthening, Supine 12X times no rest breaks   Proximal Shoulder Strengthening, Seated 10 times each without resting   Manual Therapy   Manual Therapy Myofascial release   Manual therapy comments Manual therapy completed prior  to exercises.   Myofascial Release Myofascial release and manual stretching completed to Right upper arm, trapezius, and scapularis region to decrease fascial restrictions and increase joint mobility in a pain free zone.                 OT Education - 05/14/15 1652    Education provided Yes   Education Details A/ROM in supine transitioning to standing    Person(s) Educated Patient   Methods Explanation;Demonstration;Handout   Comprehension Verbalized understanding;Returned demonstration          OT Short Term Goals - 05/15/15 0935    OT SHORT TERM GOAL #1   Title Patient will be educated and independent with a HEP to increase functional use of RUE during daily tasks.    Time 3   Period Weeks   Status Achieved   OT SHORT TERM GOAL #2   Title Patient will increase P/ROM to [facility] to increase ability to get shirts and coats on and off with less difficulty.    Time 3   Period Weeks   Status Achieved   OT SHORT TERM GOAL #3   Title Patient will increase RUE strength to 3-/5 to increase ability to complete tasks at waist level usign RUE.    Time 3   Period Weeks   Status Achieved   OT SHORT TERM GOAL #4   Title Patient will decrease fascial restrictions to min amount in RUE to increase functional mobility needed during daily tasks.    Time 3   Period Weeks   Status Achieved   OT SHORT TERM GOAL #5   Title Patient will decrease pain in RUE during daily tasks to 5/10 or less.   Time 3   Period Weeks   Status Achieved           OT Long Term Goals - 04/25/15 3154    OT LONG TERM GOAL #1   Title Patient will return to highest level of independence using RUE for all daily and work related tasks.    Time 6   Period Weeks   Status On-going   OT LONG TERM GOAL #2   Title Patient will increase A/ROM of RUE to WNL to increase ability to complete tasks overhead.   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    Time 6   Period Weeks   Status On-going   OT LONG TERM GOAL #4   Title Patient will decrease pain in RUE to 2/10 or less during daily tasks.    Time 6   Period Weeks   Status On-going   OT LONG TERM GOAL #5  Title Patient will decrease fascial restrictions to trace amount to increase functional mobility needed to complete daily tasks.    Time 6   Period Weeks   Status On-going               Plan - 05/15/15 0934    Clinical Impression Statement A: Reassessment compelted this session. Pt has met all short term goals and has made great progress with ROM and strength. Increased repetitions supine/standing, added prone exercises, and overhead lacing. Minimal VC for form and technique.   Plan P: Continue 2x a week for 4 weeks. Increase repetitions for A/ROM to 15X supine/standing. Add ball on the wall and UBE.        Problem List Patient Active Problem List   Diagnosis Date Noted  . Essential hypertension, benign 07/20/2013    Ailene Ravel, OTR/L,CBIS  [phone]  05/15/2015, 9:57 AM  [facility] [address] [place], [place], [address] Phone: [phone]   Fax:  [phone]  Name: JERAMIA SALEEBY MRN: [id number] Date of Birth: 03-13-1951

## 2015-05-17 ENCOUNTER — Ambulatory Visit (HOSPITAL_COMMUNITY): Payer: Managed Care, Other (non HMO)

## 2015-05-17 DIAGNOSIS — Z029 Encounter for administrative examinations, unspecified: Secondary | ICD-10-CM

## 2015-05-18 ENCOUNTER — Encounter (HOSPITAL_COMMUNITY): Payer: Self-pay

## 2015-05-18 ENCOUNTER — Ambulatory Visit (HOSPITAL_COMMUNITY): Payer: Managed Care, Other (non HMO) | Attending: Orthopaedic Surgery

## 2015-05-18 DIAGNOSIS — M25611 Stiffness of right shoulder, not elsewhere classified: Secondary | ICD-10-CM | POA: Diagnosis not present

## 2015-05-18 DIAGNOSIS — M25511 Pain in right shoulder: Secondary | ICD-10-CM | POA: Insufficient documentation

## 2015-05-18 DIAGNOSIS — R29898 Other symptoms and signs involving the musculoskeletal system: Secondary | ICD-10-CM | POA: Diagnosis present

## 2015-05-18 NOTE — Therapy (Signed)
[place], [place], [address] Phone: [phone]   Fax:  [phone]  Occupational Therapy Treatment  Patient Details  Name: Fernando Riley MRN: [id number] Date of Birth: 1950/06/26 Referring Provider: Dr. Jean Riley  Encounter Date: 05/18/2015      OT End of Session - 05/18/15 1144    Visit Number 10   Number of Visits 16   Date for OT Re-Evaluation 06/19/15   Authorization Type [employer]   Authorization Time Period 60 visit limit. 0 used this year.    Authorization - Visit Number 10   Authorization - Number of Visits 43   OT Start Time 1020   OT Stop Time 1100   OT Time Calculation (min) 40 min   Activity Tolerance Patient tolerated treatment well   Behavior During Therapy WFL for tasks assessed/performed      Past Medical History  Diagnosis Date  . Cat allergies   . Pollen allergies   . Hypertension   . Kidney stone   . Fatty liver   . Reactive airways dysfunction syndrome   . Impaired fasting glucose   . Venous stasis     Past Surgical History  Procedure Laterality Date  . Knee arthroscopy      x2    There were no vitals filed for this visit.  Visit Diagnosis:  Shoulder stiffness, right  Shoulder weakness      Subjective Assessment - 05/18/15 1039    Subjective  S: The doctor said everything looks good and to see him again in a month.    Currently in Pain? No/denies            [facility] OT Assessment - 05/18/15 1040    Assessment   Diagnosis Right shoulder arthroscopy   Precautions   Precautions Shoulder   Type of Shoulder Precautions Week 1-3: P/ROM, AA/ROM (except horizontal adduction), pendulums Week 4 (05/11/15) and full P/ROM: Progress to A/ROM . Week 8 (06/11/15): progress to strengthening                  OT Treatments/Exercises (OP) - 05/18/15 1040    Exercises   Exercises Shoulder   Shoulder Exercises: Supine   Protraction PROM;5 reps;AROM;15 reps   Horizontal ABduction PROM;5 reps;AROM;15 reps   External Rotation PROM;5 reps;AROM;15 reps   Internal Rotation PROM;5 reps;AROM;15 reps   Flexion PROM;5 reps;AROM;15 reps   ABduction PROM;5 reps;AROM;15 reps   Shoulder Exercises: ROM/Strengthening   UBE (Upper Arm Bike) Level 1 1' forward 1' reverse   "W" Arms 15X   X to V Arms 15X   Proximal Shoulder Strengthening, Supine 15X no rest breaks   Proximal Shoulder Strengthening, Seated 15 times each without resting   Ball on Wall 1' flexion 1' abduction   Manual Therapy   Manual Therapy Myofascial release   Manual therapy comments Manual therapy completed prior to exercises.   Myofascial Release Myofascial release and manual stretching completed to Right upper arm, trapezius, and scapularis region to decrease fascial restrictions and increase joint mobility in a pain free zone.                   OT Short Term Goals - 05/18/15 1200    OT SHORT TERM GOAL #1   Title Patient will be educated and independent with a HEP to increase functional use of RUE during daily tasks.    Time 3   Period Weeks   OT SHORT TERM GOAL #2  Title Patient will increase P/ROM to [facility] to increase ability to get shirts and coats on and off with less difficulty.    Time 3   Period Weeks   OT SHORT TERM GOAL #3   Title Patient will increase RUE strength to 3-/5 to increase ability to complete tasks at waist level usign RUE.    Time 3   Period Weeks   OT SHORT TERM GOAL #4   Title Patient will decrease fascial restrictions to min amount in RUE to increase functional mobility needed during daily tasks.    Time 3   Period Weeks   OT SHORT TERM GOAL #5   Title Patient will decrease pain in RUE during daily tasks to 5/10 or less.   Time 3   Period Weeks           OT Long Term Goals - 04/25/15 [id number]    OT LONG TERM GOAL #1   Title Patient will return to highest level of independence using RUE for all daily and work related tasks.    Time 6    Period Weeks   Status On-going   OT LONG TERM GOAL #2   Title Patient will increase A/ROM of RUE to WNL to increase ability to complete tasks overhead.   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    Time 6   Period Weeks   Status On-going   OT LONG TERM GOAL #4   Title Patient will decrease pain in RUE to 2/10 or less during daily tasks.    Time 6   Period Weeks   Status On-going   OT LONG TERM GOAL #5   Title Patient will decrease fascial restrictions to trace amount to increase functional mobility needed to complete daily tasks.    Time 6   Period Weeks   Status On-going               Plan - 05/18/15 1145    Clinical Impression Statement A: Increased supine and standing A/ROM to 15X, added ball on the wall and UBE. VC to decrease speed of exercises and allow his muscle to complete movement versus using momentum.    Plan P: Add prone hughston exercises and scapular raises.         Problem List Patient Active Problem List   Diagnosis Date Noted  . Essential hypertension, benign 07/20/2013   Ailene Ravel, OTR/L,CBIS  [phone]  05/18/2015, 12:01 PM  [facility] [address] [place], [place], [address] Phone: [phone]   Fax:  [phone]  Name: Fernando Riley MRN: [id number] Date of Birth: [birth year]

## 2015-05-22 ENCOUNTER — Encounter (HOSPITAL_COMMUNITY): Payer: Self-pay

## 2015-05-22 ENCOUNTER — Ambulatory Visit (HOSPITAL_COMMUNITY): Payer: Managed Care, Other (non HMO)

## 2015-05-22 DIAGNOSIS — R29898 Other symptoms and signs involving the musculoskeletal system: Secondary | ICD-10-CM

## 2015-05-22 DIAGNOSIS — M25611 Stiffness of right shoulder, not elsewhere classified: Secondary | ICD-10-CM

## 2015-05-22 NOTE — Therapy (Signed)
[place], [place], [address] Phone: [phone]   Fax:  [phone]  Occupational Therapy Treatment  Patient Details  Name: Fernando Riley MRN: [id number] Date of Birth: 12/13/1950 Referring Provider: Dr. Jean Rosenthal  Encounter Date: 05/22/2015      OT End of Session - 05/22/15 0954    Visit Number 11   Number of Visits 16   Date for OT Re-Evaluation 06/19/15   Authorization Type [employer]   Authorization Time Period 60 visit limit. 0 used this year.    Authorization - Visit Number 11   Authorization - Number of Visits 86   OT Start Time 0850   OT Stop Time 0930   OT Time Calculation (min) 40 min   Activity Tolerance Patient tolerated treatment well   Behavior During Therapy WFL for tasks assessed/performed      Past Medical History  Diagnosis Date  . Cat allergies   . Pollen allergies   . Hypertension   . Kidney stone   . Fatty liver   . Reactive airways dysfunction syndrome   . Impaired fasting glucose   . Venous stasis     Past Surgical History  Procedure Laterality Date  . Knee arthroscopy      x2    There were no vitals filed for this visit.  Visit Diagnosis:  Shoulder stiffness, right  Shoulder weakness      Subjective Assessment - 05/22/15 0912    Subjective  S: I still have a little popping sensation about a third of the way up.   Currently in Pain? No/denies            [facility] OT Assessment - 05/22/15 0913    Assessment   Diagnosis Right shoulder arthroscopy   Precautions   Precautions Shoulder   Type of Shoulder Precautions Week 1-3: P/ROM, AA/ROM (except horizontal adduction), pendulums Week 4 (05/11/15) and full P/ROM: Progress to A/ROM . Week 8 (06/04/15): progress to strengthening                  OT Treatments/Exercises (OP) - 05/22/15 0001    Exercises   Exercises Shoulder   Shoulder Exercises: Supine   Protraction PROM;5 reps;AROM;20 reps   Horizontal ABduction PROM;5 reps;AROM;20 reps   External Rotation PROM;5 reps;AROM;20 reps   Internal Rotation PROM;5 reps;AROM;20 reps   Flexion PROM;5 reps;AROM;20 reps   ABduction PROM;5 reps;AROM;20 reps   Shoulder Exercises: Prone   Other Prone Exercises Hughston exercises; 12X   Other Prone Exercises Scapular raises; 12X   Shoulder Exercises: Standing   Other Standing Exercises Standing field goal; 12X   Shoulder Exercises: ROM/Strengthening   Proximal Shoulder Strengthening, Supine 20X no rest breaks   Ball on Wall 1' flexion 1' abduction   Other ROM/Strengthening Exercises Angel wings against wall 10X   Other ROM/Strengthening Exercises Arms on fire; 3 minutes 30 second positions. no rest breaks                  OT Short Term Goals - 05/18/15 1200    OT SHORT TERM GOAL #1   Title Patient will be educated and independent with a HEP to increase functional use of RUE during daily tasks.    Time 3   Period Weeks   OT SHORT TERM GOAL #2   Title Patient will increase P/ROM to [facility] to increase ability to get shirts and coats on and off with less difficulty.    Time 3  Period Weeks   OT SHORT TERM GOAL #3   Title Patient will increase RUE strength to 3-/5 to increase ability to complete tasks at waist level usign RUE.    Time 3   Period Weeks   OT SHORT TERM GOAL #4   Title Patient will decrease fascial restrictions to min amount in RUE to increase functional mobility needed during daily tasks.    Time 3   Period Weeks   OT SHORT TERM GOAL #5   Title Patient will decrease pain in RUE during daily tasks to 5/10 or less.   Time 3   Period Weeks           OT Long Term Goals - 04/25/15 [id number]    OT LONG TERM GOAL #1   Title Patient will return to highest level of independence using RUE for all daily and work related tasks.    Time 6   Period Weeks   Status On-going   OT LONG TERM GOAL #2   Title Patient will increase A/ROM of RUE to WNL to increase ability to  complete tasks overhead.   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    Time 6   Period Weeks   Status On-going   OT LONG TERM GOAL #4   Title Patient will decrease pain in RUE to 2/10 or less during daily tasks.    Time 6   Period Weeks   Status On-going   OT LONG TERM GOAL #5   Title Patient will decrease fascial restrictions to trace amount to increase functional mobility needed to complete daily tasks.    Time 6   Period Weeks   Status On-going               Plan - 05/22/15 [id number]    Clinical Impression Statement A: Added prone scapular raises, standing field goals, and arms on fire. Pt tolerated well. Min VC for form and technique.    Plan P: Continue with A/ROM scapular and shoulder strengthening.         Problem List Patient Active Problem List   Diagnosis Date Noted  . Essential hypertension, benign 07/20/2013    Ailene Ravel, OTR/L,CBIS  [phone]  05/22/2015, 9:57 AM  [facility] [place], [place], [address] Phone: [phone]   Fax:  [phone]  Name: VELTON BOU MRN: [id number] Date of Birth: 06/28/1950

## 2015-05-24 ENCOUNTER — Encounter (HOSPITAL_COMMUNITY): Payer: Self-pay

## 2015-05-24 ENCOUNTER — Ambulatory Visit (HOSPITAL_COMMUNITY): Payer: Managed Care, Other (non HMO)

## 2015-05-24 DIAGNOSIS — R29898 Other symptoms and signs involving the musculoskeletal system: Secondary | ICD-10-CM

## 2015-05-24 DIAGNOSIS — M25611 Stiffness of right shoulder, not elsewhere classified: Secondary | ICD-10-CM | POA: Diagnosis not present

## 2015-05-24 NOTE — Therapy (Signed)
[place], [place], [address] Phone: [phone]   Fax:  [phone]  Occupational Therapy Treatment  Patient Details  Name: Fernando Riley MRN: [id number] Date of Birth: Aug 11, 1950 Referring Provider: Dr. Jean Rosenthal  Encounter Date: 05/24/2015      OT End of Session - 05/24/15 0936    Visit Number 12   Number of Visits 16   Date for OT Re-Evaluation 06/19/15   Authorization Type [employer]   Authorization Time Period 60 visit limit. 0 used this year.    Authorization - Visit Number 12   Authorization - Number of Visits 35   OT Start Time [phone]   OT Stop Time 0930   OT Time Calculation (min) 40 min   Activity Tolerance Patient tolerated treatment well   Behavior During Therapy WFL for tasks assessed/performed      Past Medical History  Diagnosis Date  . Cat allergies   . Pollen allergies   . Hypertension   . Kidney stone   . Fatty liver   . Reactive airways dysfunction syndrome   . Impaired fasting glucose   . Venous stasis     Past Surgical History  Procedure Laterality Date  . Knee arthroscopy      x2    There were no vitals filed for this visit.  Visit Diagnosis:  Shoulder stiffness, right  Shoulder weakness      Subjective Assessment - 05/24/15 0913    Subjective  S: It doesn't hurt unless I move it the wrong way.   Currently in Pain? No/denies            [facility] OT Assessment - 05/24/15 0914    Assessment   Diagnosis Right shoulder arthroscopy   Precautions   Precautions Shoulder   Type of Shoulder Precautions Week 1-3: P/ROM, AA/ROM (except horizontal adduction), pendulums Week 4 (05/11/15) and full P/ROM: Progress to A/ROM . Week 8 (06/04/15): progress to strengthening                  OT Treatments/Exercises (OP) - 05/24/15 0914    Exercises   Exercises Shoulder   Shoulder Exercises: Supine   Protraction PROM;5 reps   Horizontal ABduction PROM;5 reps   External Rotation PROM;5 reps   Internal Rotation PROM;5 reps   Flexion PROM;5 reps   ABduction PROM;5 reps   Shoulder Exercises: ROM/Strengthening   Other ROM/Strengthening Exercises Angel wings against wall 12X   Other ROM/Strengthening Exercises Arms on fire; 3 minutes 30 second positions. no rest breaks   Manual Therapy   Manual Therapy Myofascial release   Manual therapy comments Manual therapy completed prior to exercises.   Myofascial Release Myofascial release and manual stretching completed to Right upper arm, trapezius, and scapularis region to decrease fascial restrictions and increase joint mobility in a pain free zone.                 OT Education - 05/24/15 [phone]    Education provided Yes   Education Details scapular theraband exercises with blue theraband   Person(s) Educated Patient   Methods Explanation;Demonstration;Handout;Verbal cues   Comprehension Returned demonstration;Verbalized understanding          OT Short Term Goals - 05/18/15 1200    OT SHORT TERM GOAL #1   Title Patient will be educated and independent with a HEP to increase functional use of RUE during daily tasks.    Time 3   Period Weeks  OT SHORT TERM GOAL #2   Title Patient will increase P/ROM to [facility] to increase ability to get shirts and coats on and off with less difficulty.    Time 3   Period Weeks   OT SHORT TERM GOAL #3   Title Patient will increase RUE strength to 3-/5 to increase ability to complete tasks at waist level usign RUE.    Time 3   Period Weeks   OT SHORT TERM GOAL #4   Title Patient will decrease fascial restrictions to min amount in RUE to increase functional mobility needed during daily tasks.    Time 3   Period Weeks   OT SHORT TERM GOAL #5   Title Patient will decrease pain in RUE during daily tasks to 5/10 or less.   Time 3   Period Weeks           OT Long Term Goals - 04/25/15 [id number]    OT LONG TERM GOAL #1   Title Patient will return to highest  level of independence using RUE for all daily and work related tasks.    Time 6   Period Weeks   Status On-going   OT LONG TERM GOAL #2   Title Patient will increase A/ROM of RUE to WNL to increase ability to complete tasks overhead.   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    Time 6   Period Weeks   Status On-going   OT LONG TERM GOAL #4   Title Patient will decrease pain in RUE to 2/10 or less during daily tasks.    Time 6   Period Weeks   Status On-going   OT LONG TERM GOAL #5   Title Patient will decrease fascial restrictions to trace amount to increase functional mobility needed to complete daily tasks.    Time 6   Period Weeks   Status On-going               Plan - 05/24/15 [id number]    Clinical Impression Statement A: Increased theraband band to blue for scapular strengthening. Added to HEP. VC for form and technique.    Plan P: Add counter push ups.        Problem List Patient Active Problem List   Diagnosis Date Noted  . Essential hypertension, benign 07/20/2013    Ailene Ravel, OTR/L,CBIS  [phone]  05/24/2015, 9:39 AM  [place] [address] [place], [place], [address] Phone: [phone]   Fax:  [phone]  Name: DUAN VELTEN MRN: [id number] Date of Birth: April 20, 1950

## 2015-05-24 NOTE — Patient Instructions (Signed)

## 2015-05-29 ENCOUNTER — Encounter (HOSPITAL_COMMUNITY): Payer: Self-pay

## 2015-05-29 ENCOUNTER — Ambulatory Visit (HOSPITAL_COMMUNITY): Payer: Managed Care, Other (non HMO)

## 2015-05-29 DIAGNOSIS — R29898 Other symptoms and signs involving the musculoskeletal system: Secondary | ICD-10-CM

## 2015-05-29 DIAGNOSIS — M25611 Stiffness of right shoulder, not elsewhere classified: Secondary | ICD-10-CM

## 2015-05-29 NOTE — Therapy (Signed)
[place], [place], [address] Phone: [phone]   Fax:  [phone]  Occupational Therapy Treatment  Patient Details  Name: Fernando Riley MRN: [id number] Date of Birth: 28-Aug-1950 Referring Provider: Dr. Jean Rosenthal  Encounter Date: 05/29/2015      OT End of Session - 05/29/15 0933    Visit Number 13   Number of Visits 16   Date for OT Re-Evaluation 06/19/15   Authorization Type [employer]   Authorization Time Period 60 visit limit. 0 used this year.    Authorization - Visit Number 13   Authorization - Number of Visits 62   OT Start Time 0850   OT Stop Time 0930   OT Time Calculation (min) 40 min   Activity Tolerance Patient tolerated treatment well   Behavior During Therapy WFL for tasks assessed/performed      Past Medical History  Diagnosis Date  . Cat allergies   . Pollen allergies   . Hypertension   . Kidney stone   . Fatty liver   . Reactive airways dysfunction syndrome   . Impaired fasting glucose   . Venous stasis     Past Surgical History  Procedure Laterality Date  . Knee arthroscopy      x2    There were no vitals filed for this visit.  Visit Diagnosis:  Shoulder stiffness, right  Shoulder weakness      Subjective Assessment - 05/29/15 0906    Subjective  S: Do you think this will go into the month of March? I'm thinking about when I can return to work.   Currently in Pain? No/denies            [facility] OT Assessment - 05/29/15 0907    Assessment   Diagnosis Right shoulder arthroscopy   Precautions   Precautions Shoulder   Type of Shoulder Precautions Week 1-3: P/ROM, AA/ROM (except horizontal adduction), pendulums Week 4 (05/11/15) and full P/ROM: Progress to A/ROM . Week 8 (06/04/15): progress to strengthening                  OT Treatments/Exercises (OP) - 05/29/15 0907    Exercises   Exercises Shoulder   Shoulder Exercises: Supine   Protraction  PROM;5 reps   Horizontal ABduction PROM;5 reps   External Rotation PROM;5 reps   Internal Rotation PROM;5 reps   Flexion PROM;5 reps   ABduction PROM;5 reps   Shoulder Exercises: Prone   Other Prone Exercises Hughston exercises; 15X   Other Prone Exercises Scapular raises; 15X   Shoulder Exercises: Standing   Protraction Theraband;15 reps   Theraband Level (Shoulder Protraction) Level 4 (Blue)   Horizontal ABduction Theraband;15 reps   Theraband Level (Shoulder Horizontal ABduction) Level 4 (Blue)   External Rotation Theraband;15 reps   Theraband Level (Shoulder External Rotation) Level 4 (Blue)   Flexion Theraband;15 reps   Theraband Level (Shoulder Flexion) Level 4 (Blue)   ABduction Theraband  7X   Theraband Level (Shoulder ABduction) Level 3 (Green)   Shoulder Exercises: ROM/Strengthening   UBE (Upper Arm Bike) Level 2 2' forward 2' reverse   Pushups 10 reps  countertop    "W" Arms 15X   X to V Arms 15X   Ball on Wall 1' flexion 1' abduction   Other ROM/Strengthening Exercises Angel wings against wall 15X   Other ROM/Strengthening Exercises Green therapy ball; standing; shoulder flexion, chest press, Circles left/right,    Manual Therapy   Manual Therapy  Myofascial release   Manual therapy comments Manual therapy completed prior to exercises.   Myofascial Release Myofascial release and manual stretching completed to Right upper arm, trapezius, and scapularis region to decrease fascial restrictions and increase joint mobility in a pain free zone.                   OT Short Term Goals - 05/18/15 1200    OT SHORT TERM GOAL #1   Title Patient will be educated and independent with a HEP to increase functional use of RUE during daily tasks.    Time 3   Period Weeks   OT SHORT TERM GOAL #2   Title Patient will increase P/ROM to [facility] to increase ability to get shirts and coats on and off with less difficulty.    Time 3   Period Weeks   OT SHORT TERM GOAL #3   Title  Patient will increase RUE strength to 3-/5 to increase ability to complete tasks at waist level usign RUE.    Time 3   Period Weeks   OT SHORT TERM GOAL #4   Title Patient will decrease fascial restrictions to min amount in RUE to increase functional mobility needed during daily tasks.    Time 3   Period Weeks   OT SHORT TERM GOAL #5   Title Patient will decrease pain in RUE during daily tasks to 5/10 or less.   Time 3   Period Weeks           OT Long Term Goals - 04/25/15 [id number]    OT LONG TERM GOAL #1   Title Patient will return to highest level of independence using RUE for all daily and work related tasks.    Time 6   Period Weeks   Status On-going   OT LONG TERM GOAL #2   Title Patient will increase A/ROM of RUE to WNL to increase ability to complete tasks overhead.   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    Time 6   Period Weeks   Status On-going   OT LONG TERM GOAL #4   Title Patient will decrease pain in RUE to 2/10 or less during daily tasks.    Time 6   Period Weeks   Status On-going   OT LONG TERM GOAL #5   Title Patient will decrease fascial restrictions to trace amount to increase functional mobility needed to complete daily tasks.    Time 6   Period Weeks   Status On-going               Plan - 05/29/15 0915    Clinical Impression Statement A: Added shoulder endurance exercise with therapy. Pt only required min VC for form and technique.    Plan P: Progress to strengthening with 1#.        Problem List Patient Active Problem List   Diagnosis Date Noted  . Essential hypertension, benign 07/20/2013     Ailene Ravel, OTR/L,CBIS  [phone]  05/29/2015, 9:34 AM  [place] [address] [place], [place], [address] Phone: [phone]   Fax:  [phone]  Name:  Fernando Riley MRN: [id number] Date of Birth: Dec 14, 1950

## 2015-05-31 ENCOUNTER — Ambulatory Visit (HOSPITAL_COMMUNITY): Payer: Managed Care, Other (non HMO)

## 2015-05-31 ENCOUNTER — Encounter (HOSPITAL_COMMUNITY): Payer: Self-pay

## 2015-05-31 DIAGNOSIS — M25611 Stiffness of right shoulder, not elsewhere classified: Secondary | ICD-10-CM | POA: Diagnosis not present

## 2015-05-31 DIAGNOSIS — M25511 Pain in right shoulder: Secondary | ICD-10-CM

## 2015-05-31 DIAGNOSIS — R29898 Other symptoms and signs involving the musculoskeletal system: Secondary | ICD-10-CM

## 2015-05-31 NOTE — Therapy (Signed)
[place], [place], [address] Phone: [phone]   Fax:  [phone]  Occupational Therapy Treatment  Patient Details  Name: Fernando Riley MRN: [id number] Date of Birth: 07-Aug-1950 Referring Provider: Dr. Jean Rosenthal  Encounter Date: 05/31/2015      OT End of Session - 05/31/15 0929    Visit Number 14   Number of Visits 16   Date for OT Re-Evaluation 06/19/15   Authorization Type [employer]   Authorization Time Period 60 visit limit. 0 used this year.    Authorization - Visit Number 14   Authorization - Number of Visits 68   OT Start Time 0850   OT Stop Time 0930   OT Time Calculation (min) 40 min   Activity Tolerance Patient tolerated treatment well   Behavior During Therapy WFL for tasks assessed/performed      Past Medical History  Diagnosis Date  . Cat allergies   . Pollen allergies   . Hypertension   . Kidney stone   . Fatty liver   . Reactive airways dysfunction syndrome   . Impaired fasting glucose   . Venous stasis     Past Surgical History  Procedure Laterality Date  . Knee arthroscopy      x2    There were no vitals filed for this visit.  Visit Diagnosis:  Shoulder weakness  Shoulder stiffness, right  Pain in joint of right shoulder      Subjective Assessment - 05/31/15 0912    Currently in Pain? Yes   Pain Score 2    Pain Location Shoulder   Pain Orientation Right   Pain Descriptors / Indicators Sore   Pain Type Acute pain   Pain Onset More than a month ago   Pain Frequency Occasional   Aggravating Factors  Strengthening exercises   Pain Relieving Factors rest   Effect of Pain on Daily Activities None   Multiple Pain Sites No            [facility] OT Assessment - 05/31/15 0913    Assessment   Diagnosis Right shoulder arthroscopy   Precautions   Precautions Shoulder   Type of Shoulder Precautions Week 1-3: P/ROM, AA/ROM (except horizontal adduction), pendulums  Week 4 (05/11/15) and full P/ROM: Progress to A/ROM . Week 8 (06/04/15): progress to strengthening                  OT Treatments/Exercises (OP) - 05/31/15 0913    Exercises   Exercises Shoulder   Shoulder Exercises: Supine   Protraction PROM;5 reps;Strengthening;12 reps   Protraction Weight (lbs) 1   Horizontal ABduction PROM;5 reps;Strengthening;10 reps   Horizontal ABduction Weight (lbs) 1   External Rotation PROM;5 reps;Strengthening;12 reps   External Rotation Weight (lbs) 1   Internal Rotation PROM;5 reps;Strengthening;12 reps   Internal Rotation Weight (lbs) 1   Flexion PROM;5 reps;Strengthening;12 reps   Shoulder Flexion Weight (lbs) 1   ABduction PROM;5 reps;Strengthening;12 reps   Shoulder ABduction Weight (lbs) 1   Shoulder Exercises: Prone   Other Prone Exercises Hughston exercises; 12X with 1#   Other Prone Exercises Scapular raises; 12X with 1#   Shoulder Exercises: Standing   Protraction Strengthening;12 reps   Protraction Weight (lbs) 1   Horizontal ABduction Strengthening;12 reps   Horizontal ABduction Weight (lbs) 1   External Rotation Strengthening;12 reps   External Rotation Weight (lbs) 1   Internal Rotation Strengthening;12 reps   Internal Rotation Weight (lbs) 1  Flexion Strengthening;12 reps   Shoulder Flexion Weight (lbs) 1   ABduction Strengthening;12 reps   Shoulder ABduction Weight (lbs) 1   Shoulder Exercises: ROM/Strengthening   UBE (Upper Arm Bike) Level 2 3' forward 3' reverse   Cybex Press 2 plate;15 reps   Cybex Row 2 plate;15 reps   "W" Arms 12X with 1#   X to V Arms 12X with 1#   Proximal Shoulder Strengthening, Seated 12X with 1#   Manual Therapy   Manual Therapy Myofascial release   Manual therapy comments Manual therapy completed prior to exercises.   Myofascial Release Myofascial release and manual stretching completed to Right upper arm, trapezius, and scapularis region to decrease fascial restrictions and increase joint  mobility in a pain free zone.                 OT Education - 05/31/15 [phone]    Education provided Yes   Education Details begin exercises at home with 1lb weight / water bottle / can of soup   Person(s) Educated Patient   Methods Explanation   Comprehension Verbalized understanding          OT Short Term Goals - 05/18/15 1200    OT SHORT TERM GOAL #1   Title Patient will be educated and independent with a HEP to increase functional use of RUE during daily tasks.    Time 3   Period Weeks   OT SHORT TERM GOAL #2   Title Patient will increase P/ROM to [facility] to increase ability to get shirts and coats on and off with less difficulty.    Time 3   Period Weeks   OT SHORT TERM GOAL #3   Title Patient will increase RUE strength to 3-/5 to increase ability to complete tasks at waist level usign RUE.    Time 3   Period Weeks   OT SHORT TERM GOAL #4   Title Patient will decrease fascial restrictions to min amount in RUE to increase functional mobility needed during daily tasks.    Time 3   Period Weeks   OT SHORT TERM GOAL #5   Title Patient will decrease pain in RUE during daily tasks to 5/10 or less.   Time 3   Period Weeks           OT Long Term Goals - 04/25/15 [id number]    OT LONG TERM GOAL #1   Title Patient will return to highest level of independence using RUE for all daily and work related tasks.    Time 6   Period Weeks   Status On-going   OT LONG TERM GOAL #2   Title Patient will increase A/ROM of RUE to WNL to increase ability to complete tasks overhead.   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    Time 6   Period Weeks   Status On-going   OT LONG TERM GOAL #4   Title Patient will decrease pain in RUE to 2/10 or less during daily tasks.    Time 6   Period Weeks   Status On-going   OT LONG TERM GOAL #5   Title Patient will decrease  fascial restrictions to trace amount to increase functional mobility needed to complete daily tasks.    Time 6   Period Weeks   Status On-going  Plan - 05/31/15 0929    Clinical Impression Statement A: Added 1# weight to supine and standing exercises. Added Cybex row and press. Pt required min VC for form and technique. Pt reports that he began to feel a muscle burn around repetition 9 during the exercises.    Plan P: Attempt 2# weight at 10 repetitions if able to tolerate.         Problem List Patient Active Problem List   Diagnosis Date Noted  . Essential hypertension, benign 07/20/2013    Ailene Ravel, OTR/L,CBIS  [phone]  05/31/2015, 9:31 AM  [place] [address] [place], [place], [address] Phone: [phone]   Fax:  [phone]  Name: RYLE LORENZINI MRN: [id number] Date of Birth: 06/07/1950

## 2015-06-05 ENCOUNTER — Ambulatory Visit (HOSPITAL_COMMUNITY): Payer: Managed Care, Other (non HMO)

## 2015-06-05 ENCOUNTER — Encounter (HOSPITAL_COMMUNITY): Payer: Self-pay

## 2015-06-05 DIAGNOSIS — M25611 Stiffness of right shoulder, not elsewhere classified: Secondary | ICD-10-CM

## 2015-06-05 DIAGNOSIS — R29898 Other symptoms and signs involving the musculoskeletal system: Secondary | ICD-10-CM

## 2015-06-05 DIAGNOSIS — M25511 Pain in right shoulder: Secondary | ICD-10-CM

## 2015-06-05 NOTE — Therapy (Signed)
[place], [place], [address] Phone: [phone]   Fax:  [phone]  Occupational Therapy Treatment  Patient Details  Name: Fernando Riley MRN: [id number] Date of Birth: [birth year] Referring Provider: Dr. Jean Rosenthal  Encounter Date: 06/05/2015      OT End of Session - 06/05/15 1045    Visit Number 15   Number of Visits 16   Date for OT Re-Evaluation 06/19/15   Authorization Type [employer]   Authorization Time Period 60 visit limit. 0 used this year.    Authorization - Visit Number 15   Authorization - Number of Visits 70   OT Start Time 1005   OT Stop Time 1045   OT Time Calculation (min) 40 min   Activity Tolerance Patient tolerated treatment well   Behavior During Therapy WFL for tasks assessed/performed      Past Medical History  Diagnosis Date  . Cat allergies   . Pollen allergies   . Hypertension   . Kidney stone   . Fatty liver   . Reactive airways dysfunction syndrome   . Impaired fasting glucose   . Venous stasis     Past Surgical History  Procedure Laterality Date  . Knee arthroscopy      x2    There were no vitals filed for this visit.  Visit Diagnosis:  Shoulder weakness  Shoulder stiffness, right  Pain in joint of right shoulder      Subjective Assessment - 06/05/15 1024    Subjective  S: I helped a buddy move a gas grill and I shouldn't have. I felt it while I was carrying it.   Currently in Pain? Yes   Pain Score 1    Pain Location Shoulder   Pain Orientation Right   Pain Descriptors / Indicators Sore;Nagging   Pain Type Acute pain   Pain Onset More than a month ago   Pain Frequency Occasional   Aggravating Factors  Increased weight during daily tasks.   Pain Relieving Factors rest   Effect of Pain on Daily Activities None            [facility] OT Assessment - 06/05/15 1026    Assessment   Diagnosis Right shoulder arthroscopy   Precautions   Precautions  Shoulder   Type of Shoulder Precautions Week 1-3: P/ROM, AA/ROM (except horizontal adduction), pendulums Week 4 (05/11/15) and full P/ROM: Progress to A/ROM . Week 8 (06/04/15): progress to strengthening                  OT Treatments/Exercises (OP) - 06/05/15 1026    Exercises   Exercises Shoulder   Shoulder Exercises: Supine   Protraction PROM;5 reps;Strengthening;12 reps   Protraction Weight (lbs) 2   Horizontal ABduction PROM;5 reps;Strengthening;10 reps   Horizontal ABduction Weight (lbs) 2   External Rotation PROM;5 reps;Strengthening;12 reps   External Rotation Weight (lbs) 2   Internal Rotation PROM;5 reps;Strengthening;12 reps   Internal Rotation Weight (lbs) 2   Flexion PROM;5 reps;Strengthening;12 reps   Shoulder Flexion Weight (lbs) 2   ABduction PROM;5 reps;Strengthening;12 reps   Shoulder ABduction Weight (lbs) 2   Shoulder Exercises: Prone   Other Prone Exercises Hughston exercises; 12X with 2#   Other Prone Exercises Scapular raises; 12X with 2#   Shoulder Exercises: Standing   Protraction Strengthening;12 reps   Protraction Weight (lbs) 2   Horizontal ABduction Strengthening;12 reps   Horizontal ABduction Weight (lbs) 2   External Rotation Strengthening;12  reps   External Rotation Weight (lbs) 2   Internal Rotation Strengthening;12 reps   Internal Rotation Weight (lbs) 2   Flexion Strengthening;12 reps   Shoulder Flexion Weight (lbs) 2   ABduction Strengthening;12 reps   Shoulder ABduction Weight (lbs) 2   Shoulder Exercises: ROM/Strengthening   Cybex Press 2 plate;20 reps   Cybex Row 2 plate;20 reps   Proximal Shoulder Strengthening, Supine 12X with 2#   Proximal Shoulder Strengthening, Seated 12X with 2#   Ball on Wall 1' flexion 1' abduction   Manual Therapy   Manual Therapy Myofascial release   Manual therapy comments Manual therapy completed prior to exercises.   Myofascial Release Myofascial release and manual stretching completed to Right  upper arm, trapezius, and scapularis region to decrease fascial restrictions and increase joint mobility in a pain free zone.                   OT Short Term Goals - 05/18/15 1200    OT SHORT TERM GOAL #1   Title Patient will be educated and independent with a HEP to increase functional use of RUE during daily tasks.    Time 3   Period Weeks   OT SHORT TERM GOAL #2   Title Patient will increase P/ROM to [facility] to increase ability to get shirts and coats on and off with less difficulty.    Time 3   Period Weeks   OT SHORT TERM GOAL #3   Title Patient will increase RUE strength to 3-/5 to increase ability to complete tasks at waist level usign RUE.    Time 3   Period Weeks   OT SHORT TERM GOAL #4   Title Patient will decrease fascial restrictions to min amount in RUE to increase functional mobility needed during daily tasks.    Time 3   Period Weeks   OT SHORT TERM GOAL #5   Title Patient will decrease pain in RUE during daily tasks to 5/10 or less.   Time 3   Period Weeks           OT Long Term Goals - 04/25/15 [id number]    OT LONG TERM GOAL #1   Title Patient will return to highest level of independence using RUE for all daily and work related tasks.    Time 6   Period Weeks   Status On-going   OT LONG TERM GOAL #2   Title Patient will increase A/ROM of RUE to WNL to increase ability to complete tasks overhead.   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    Time 6   Period Weeks   Status On-going   OT LONG TERM GOAL #4   Title Patient will decrease pain in RUE to 2/10 or less during daily tasks.    Time 6   Period Weeks   Status On-going   OT LONG TERM GOAL #5   Title Patient will decrease fascial restrictions to trace amount to increase functional mobility needed to complete daily tasks.    Time 6   Period Weeks   Status On-going                Plan - 06/05/15 1046    Clinical Impression Statement A: Progressed to 2# supine and standing without difficulty. Min VC for form and  technique.    Plan P: Continue with strengthening. Add weight box activity/work hardening.        Problem List Patient Active Problem List   Diagnosis Date Noted  . Essential hypertension, benign 07/20/2013    Ailene Ravel, OTR/L,CBIS  [phone]  06/05/2015, 11:14 AM  [place] [address] [place], [place], [address] Phone: [phone]   Fax:  [phone]  Name: Fernando Riley MRN: [id number] Date of Birth: 18-Dec-1950

## 2015-06-07 ENCOUNTER — Ambulatory Visit (HOSPITAL_COMMUNITY): Payer: Managed Care, Other (non HMO)

## 2015-06-07 DIAGNOSIS — M25611 Stiffness of right shoulder, not elsewhere classified: Secondary | ICD-10-CM | POA: Diagnosis not present

## 2015-06-07 DIAGNOSIS — R29898 Other symptoms and signs involving the musculoskeletal system: Secondary | ICD-10-CM

## 2015-06-07 NOTE — Therapy (Signed)
[place], [place], [address] Phone: [phone]   Fax:  [phone]  Occupational Therapy Treatment  Patient Details  Name: Fernando Riley MRN: [id number] Date of Birth: 1950-11-25 Referring Provider: Dr. Jean Rosenthal  Encounter Date: 06/07/2015      OT End of Session - 06/07/15 1001    Visit Number 16   Number of Visits 17   Date for OT Re-Evaluation 06/19/15   Authorization Type [employer]   Authorization Time Period 60 visit limit. 0 used this year.    Authorization - Visit Number 16   Authorization - Number of Visits 58   OT Start Time 0850   OT Stop Time 0930   OT Time Calculation (min) 40 min   Activity Tolerance Patient tolerated treatment well   Behavior During Therapy WFL for tasks assessed/performed      Past Medical History  Diagnosis Date  . Cat allergies   . Pollen allergies   . Hypertension   . Kidney stone   . Fatty liver   . Reactive airways dysfunction syndrome   . Impaired fasting glucose   . Venous stasis     Past Surgical History  Procedure Laterality Date  . Knee arthroscopy      x2    There were no vitals filed for this visit.  Visit Diagnosis:  Shoulder weakness  Shoulder stiffness, right                    OT Treatments/Exercises (OP) - 06/07/15 0913    Exercises   Exercises Shoulder;Work Quarry manager Exercises: Supine   Protraction PROM;5 reps   Horizontal ABduction PROM;5 reps   External Rotation PROM;5 reps   Internal Rotation PROM;5 reps   Flexion PROM;5 reps   ABduction PROM;5 reps   Shoulder Exercises: Prone   Other Prone Exercises Hughston exercises; 12X with 2#   Other Prone Exercises Scapular raises; 12X with 2#   Shoulder Exercises: Standing   Protraction Strengthening;15 reps   Protraction Weight (lbs) 2   Horizontal ABduction Strengthening;15 reps   Horizontal ABduction Weight (lbs) 2   External Rotation  Strengthening;15 reps   External Rotation Weight (lbs) 2   External Rotation Limitations shoulder abducted   Internal Rotation Strengthening;15 reps   Internal Rotation Weight (lbs) 2   Internal Rotation Limitations shoulder abducted   Flexion Strengthening;15 reps   Shoulder Flexion Weight (lbs) 2   ABduction Strengthening;15 reps   Shoulder ABduction Weight (lbs) 2   Other Standing Exercises Standing row with blue theraband 12X   Other Standing Exercises Patient moved table up and down using crank with right arm; 2X   Shoulder Exercises: ROM/Strengthening   Proximal Shoulder Strengthening, Seated 15X with 2#   Work Chief Strategy Officer from Waist to Overhead (lbs/reps) Patient completed weighted box lifts with 10# 5X   Manual Therapy   Manual Therapy Myofascial release   Manual therapy comments Manual therapy completed prior to exercises.   Myofascial Release Myofascial release and manual stretching completed to Right upper arm, trapezius, and scapularis region to decrease fascial restrictions and increase joint mobility in a pain free zone.                   OT Short Term Goals - 05/18/15 1200    OT SHORT TERM GOAL #1   Title Patient will be educated and independent with a HEP to increase functional use of RUE  during daily tasks.    Time 3   Period Weeks   OT SHORT TERM GOAL #2   Title Patient will increase P/ROM to [facility] to increase ability to get shirts and coats on and off with less difficulty.    Time 3   Period Weeks   OT SHORT TERM GOAL #3   Title Patient will increase RUE strength to 3-/5 to increase ability to complete tasks at waist level usign RUE.    Time 3   Period Weeks   OT SHORT TERM GOAL #4   Title Patient will decrease fascial restrictions to min amount in RUE to increase functional mobility needed during daily tasks.    Time 3   Period Weeks   OT SHORT TERM GOAL #5   Title Patient will decrease pain in RUE during daily tasks to 5/10 or less.    Time 3   Period Weeks           OT Long Term Goals - 04/25/15 [id number]    OT LONG TERM GOAL #1   Title Patient will return to highest level of independence using RUE for all daily and work related tasks.    Time 6   Period Weeks   Status On-going   OT LONG TERM GOAL #2   Title Patient will increase A/ROM of RUE to WNL to increase ability to complete tasks overhead.   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    Time 6   Period Weeks   Status On-going   OT LONG TERM GOAL #4   Title Patient will decrease pain in RUE to 2/10 or less during daily tasks.    Time 6   Period Weeks   Status On-going   OT LONG TERM GOAL #5   Title Patient will decrease fascial restrictions to trace amount to increase functional mobility needed to complete daily tasks.    Time 6   Period Weeks   Status On-going               Plan - 06/07/15 1001    Clinical Impression Statement A: Completed work hardening activities mimicing work related motions. Patient completed all exercises with min VC for form and technique.    Plan P: measure for MD appointment         Problem List Patient Active Problem List   Diagnosis Date Noted  . Essential hypertension, benign 07/20/2013    Fernando Riley, OTR/L,CBIS  [phone]  06/07/2015, 10:12 AM  [place] [address] [place], [place], [address] Phone: [phone]   Fax:  [phone]  Name: Fernando Riley MRN: [id number] Date of Birth: Feb 09, 1951

## 2015-06-12 ENCOUNTER — Encounter (HOSPITAL_COMMUNITY): Payer: Self-pay

## 2015-06-12 ENCOUNTER — Ambulatory Visit (HOSPITAL_COMMUNITY): Payer: Managed Care, Other (non HMO)

## 2015-06-12 DIAGNOSIS — M25611 Stiffness of right shoulder, not elsewhere classified: Secondary | ICD-10-CM | POA: Diagnosis not present

## 2015-06-12 DIAGNOSIS — R29898 Other symptoms and signs involving the musculoskeletal system: Secondary | ICD-10-CM

## 2015-06-12 NOTE — Therapy (Signed)
[place], [place], [address] Phone: [phone]   Fax:  [phone]  Occupational Therapy Treatment And reassessment  Patient Details  Name: Fernando Riley MRN: [id number] Date of Birth: 09/03/1950 Referring Provider: Dr. Jean Rosenthal  Encounter Date: 06/12/2015      OT End of Session - 06/12/15 0941    Visit Number 17   Number of Visits 17   Authorization Type [employer]   Authorization Time Period 60 visit limit. 0 used this year.    Authorization - Visit Number 17   Authorization - Number of Visits 60   OT Start Time 0845   OT Stop Time 0930   OT Time Calculation (min) 45 min   Activity Tolerance Patient tolerated treatment well   Behavior During Therapy WFL for tasks assessed/performed      Past Medical History  Diagnosis Date  . Cat allergies   . Pollen allergies   . Hypertension   . Kidney stone   . Fatty liver   . Reactive airways dysfunction syndrome   . Impaired fasting glucose   . Venous stasis     Past Surgical History  Procedure Laterality Date  . Knee arthroscopy      x2    There were no vitals filed for this visit.  Visit Diagnosis:  Shoulder weakness  Shoulder stiffness, right      Subjective Assessment - 06/12/15 0927    Subjective  S: I was doing housework with it this weekend and I was a little sore    Special Tests FOTO score: 84/100   Currently in Pain? No/denies            [facility] OT Assessment - 06/12/15 0849    Assessment   Diagnosis Right shoulder arthroscopy   Precautions   Precautions Shoulder   Type of Shoulder Precautions Week 1-3: P/ROM, AA/ROM (except horizontal adduction), pendulums Week 4 (05/11/15) and full P/ROM: Progress to A/ROM . Week 8 (06/04/15): progress to strengthening   AROM   Overall AROM Comments Assessed standing. IR/er adducted   AROM Assessment Site Shoulder   Right/Left Shoulder Right   Right Shoulder Flexion 175 Degrees   Previous: 150   Right Shoulder ABduction 180 Degrees  previous: 155   Right Shoulder Internal Rotation 90 Degrees   Right Shoulder External Rotation 80 Degrees  previous: 71   Strength   Overall Strength Comments Assessed standing. IR/er adducted   Strength Assessment Site Shoulder   Right/Left Shoulder Right   Right Shoulder Flexion 5/5  previous: 3+/5   Right Shoulder ABduction 5/5  previous: 3+/5   Right Shoulder Internal Rotation 5/5  previous: 3+/5   Right Shoulder External Rotation 5/5  previous: 3+/5                  OT Treatments/Exercises (OP) - 06/12/15 9150    Exercises   Exercises Shoulder;Work Quarry manager Exercises: Supine   Protraction PROM;5 reps   Horizontal ABduction PROM;5 reps   External Rotation PROM;5 reps   Internal Rotation PROM;5 reps   Flexion PROM;5 reps   ABduction PROM;5 reps   Shoulder Exercises: Standing   Horizontal ABduction Theraband;12 reps   Theraband Level (Shoulder Horizontal ABduction) Level 4 (Blue)   External Rotation Theraband;12 reps   Theraband Level (Shoulder External Rotation) Level 4 (Blue)   Internal Rotation Theraband;12 reps   Theraband Level (Shoulder Internal Rotation) Level 4 (Blue)   ABduction Theraband;12 reps  Theraband Level (Shoulder ABduction) Level 4 (Blue)   Manual Therapy   Manual Therapy Myofascial release   Manual therapy comments Manual therapy completed prior to exercises.   Myofascial Release Myofascial release and manual stretching completed to Right upper arm, trapezius, and scapularis region to decrease fascial restrictions and increase joint mobility in a pain free zone.                 OT Education - 06/12/15 0940    Education provided Yes   Education Details reviewed therapy goals. Patient was given an updated HEP including blue theraband.   Person(s) Educated Patient   Methods Explanation;Demonstration;Verbal cues;Handout   Comprehension Verbalized  understanding;Returned demonstration          OT Short Term Goals - 05/18/15 1200    OT SHORT TERM GOAL #1   Title Patient will be educated and independent with a HEP to increase functional use of RUE during daily tasks.    Time 3   Period Weeks   OT SHORT TERM GOAL #2   Title Patient will increase P/ROM to [facility] to increase ability to get shirts and coats on and off with less difficulty.    Time 3   Period Weeks   OT SHORT TERM GOAL #3   Title Patient will increase RUE strength to 3-/5 to increase ability to complete tasks at waist level usign RUE.    Time 3   Period Weeks   OT SHORT TERM GOAL #4   Title Patient will decrease fascial restrictions to min amount in RUE to increase functional mobility needed during daily tasks.    Time 3   Period Weeks   OT SHORT TERM GOAL #5   Title Patient will decrease pain in RUE during daily tasks to 5/10 or less.   Time 3   Period Weeks           OT Long Term Goals - 06/12/15 0911    OT LONG TERM GOAL #1   Title Patient will return to highest level of independence using RUE for all daily and work related tasks.    Time 6   Period Weeks   Status Achieved   OT LONG TERM GOAL #2   Title Patient will increase A/ROM of RUE to WNL to increase ability to complete tasks overhead.   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    Time 6   Period Weeks   Status Achieved   OT LONG TERM GOAL #4   Title Patient will decrease pain in RUE to 2/10 or less during daily tasks.    Time 6   Period Weeks   Status Achieved   OT LONG TERM GOAL #5   Title Patient will decrease fascial restrictions to trace amount to increase functional mobility needed to complete daily tasks.    Time 6   Period Weeks   Status Achieved               Plan - 06/12/15 0941    Clinical Impression Statement A: Reassessment and measurements  taken for MD appointment. patient has met all therapy goals, increased his ROM and strength to normal limits and is in agreement with discharge. Updated HEP and reviewed exercises.    Plan P: D/C from therapy with HEP.    Consulted and  Agree with Plan of Care Patient        Problem List Patient Active Problem List   Diagnosis Date Noted  . Essential hypertension, benign 07/20/2013  OCCUPATIONAL THERAPY DISCHARGE SUMMARY  Visits from Start of Care: 17  Current functional level related to goals / functional outcomes: See above   Remaining deficits: None   Education / Equipment: Shoulder and scapular strengthening. Self myofascial release Plan: Patient agrees to discharge.  Patient goals were met. Patient is being discharged due to meeting the stated rehab goals.  ?????       Ailene Ravel, OTR/L,CBIS  [phone]  06/12/2015, 9:43 AM  [place] [address] [place], [place], [address] Phone: [phone]   Fax:  [phone]  Name: Fernando Riley MRN: [id number] Date of Birth: 03/13/1951

## 2015-06-12 NOTE — Patient Instructions (Signed)
Strengthening: Chest Pull - Resisted   Hold Theraband in front of body with hands about shoulder width a part. Pull band a part and back together slowly. Repeat _12-15___ times. Complete __1__ set(s) per session.. Repeat __1__ session(s) per day.  http://orth.exer.us/926   Copyright  VHI. All rights reserved.   PNF Strengthening: Resisted   Standing with resistive band around each hand, bring right arm up and away, thumb back. Repeat _12-15___ times per set. Do __1__ sets per session. Do __1__ sessions per day.    Resisted External Rotation: in Neutral - Bilateral   Sit or stand, tubing in both hands, elbows at sides, bent to 90, forearms forward. Pinch shoulder blades together and rotate forearms out. Keep elbows at sides. Repeat _12-15___ times per set. Do __1__ sets per session. Do _1___ sessions per day.  http://orth.exer.us/966   Copyright  VHI. All rights reserved.   PNF Strengthening: Resisted   Standing, hold resistive band above head. Bring right arm down and out from side. Repeat _12-15___ times per set. Do _1___ sets per session. Do __1__ sessions per day.  http://orth.exer.us/922   Copyright  VHI. All rights reserved.

## 2015-06-25 ENCOUNTER — Encounter: Payer: Self-pay | Admitting: Family Medicine

## 2015-06-27 ENCOUNTER — Other Ambulatory Visit (HOSPITAL_COMMUNITY): Payer: Self-pay | Admitting: Respiratory Therapy

## 2015-06-27 DIAGNOSIS — G4730 Sleep apnea, unspecified: Secondary | ICD-10-CM

## 2015-06-27 DIAGNOSIS — R0683 Snoring: Secondary | ICD-10-CM

## 2015-06-27 DIAGNOSIS — R5383 Other fatigue: Secondary | ICD-10-CM

## 2015-07-07 ENCOUNTER — Encounter (HOSPITAL_BASED_OUTPATIENT_CLINIC_OR_DEPARTMENT_OTHER): Payer: Self-pay | Admitting: Emergency Medicine

## 2015-07-07 ENCOUNTER — Emergency Department (HOSPITAL_BASED_OUTPATIENT_CLINIC_OR_DEPARTMENT_OTHER)
Admission: EM | Admit: 2015-07-07 | Discharge: 2015-07-07 | Disposition: A | Discharge status: Home / Self Care | Payer: Worker's Compensation | Attending: Emergency Medicine | Admitting: Emergency Medicine

## 2015-07-07 DIAGNOSIS — Y998 Other external cause status: Secondary | ICD-10-CM | POA: Diagnosis not present

## 2015-07-07 DIAGNOSIS — Z8719 Personal history of other diseases of the digestive system: Secondary | ICD-10-CM | POA: Diagnosis not present

## 2015-07-07 DIAGNOSIS — X500XXA Overexertion from strenuous movement or load, initial encounter: Secondary | ICD-10-CM | POA: Insufficient documentation

## 2015-07-07 DIAGNOSIS — Z9889 Other specified postprocedural states: Secondary | ICD-10-CM | POA: Diagnosis not present

## 2015-07-07 DIAGNOSIS — J45909 Unspecified asthma, uncomplicated: Secondary | ICD-10-CM | POA: Diagnosis not present

## 2015-07-07 DIAGNOSIS — S46211A Strain of muscle, fascia and tendon of other parts of biceps, right arm, initial encounter: Secondary | ICD-10-CM

## 2015-07-07 DIAGNOSIS — Z7982 Long term (current) use of aspirin: Secondary | ICD-10-CM | POA: Diagnosis not present

## 2015-07-07 DIAGNOSIS — Z79899 Other long term (current) drug therapy: Secondary | ICD-10-CM | POA: Insufficient documentation

## 2015-07-07 DIAGNOSIS — Z791 Long term (current) use of non-steroidal anti-inflammatories (NSAID): Secondary | ICD-10-CM | POA: Diagnosis not present

## 2015-07-07 DIAGNOSIS — Y9289 Other specified places as the place of occurrence of the external cause: Secondary | ICD-10-CM | POA: Insufficient documentation

## 2015-07-07 DIAGNOSIS — Z87442 Personal history of urinary calculi: Secondary | ICD-10-CM | POA: Diagnosis not present

## 2015-07-07 DIAGNOSIS — S4991XA Unspecified injury of right shoulder and upper arm, initial encounter: Secondary | ICD-10-CM | POA: Diagnosis present

## 2015-07-07 DIAGNOSIS — Y9389 Activity, other specified: Secondary | ICD-10-CM | POA: Diagnosis not present

## 2015-07-07 DIAGNOSIS — I10 Essential (primary) hypertension: Secondary | ICD-10-CM | POA: Diagnosis not present

## 2015-07-07 NOTE — ED Provider Notes (Signed)
CSN: [id number]     Arrival date & time 07/07/15  1055 History   First MD Initiated Contact with Patient 07/07/15 1115     Chief Complaint  Patient presents with  . Arm Pain   (Consider location/radiation/quality/duration/timing/severity/associated sxs/prior Treatment) HPI 65 y.o. male with a hx of Left rotator cuff repair, presents to the Emergency Department today complaining of left bicep pain. Pt states that he was lifting a trailer on his truck when he felt a pop in his bicep. Noticed immediate pain and swelling. Occurred around 2230 last night. Has not tried any OTC medication. Notes pain is 3/10 and a pulling sensation. No other symptoms noted.  Past Medical History  Diagnosis Date  . Cat allergies   . Pollen allergies   . Hypertension   . Kidney stone   . Fatty liver   . Reactive airways dysfunction syndrome   . Impaired fasting glucose   . Venous stasis    Past Surgical History  Procedure Laterality Date  . Knee arthroscopy      x2  . Shoulder surgery Right 2016   Family History  Problem Relation Age of Onset  . Heart disease Father   . Diabetes Brother   . Hyperlipidemia Brother    Social History  Substance Use Topics  . Smoking status: Never Smoker   . Smokeless tobacco: None  . Alcohol Use: No    Review of Systems ROS reviewed and all are negative for acute change except as noted in the HPI.  Allergies  Other and Red dye  Home Medications   Prior to Admission medications   Medication Sig Start Date End Date Taking? Authorizing Provider  diclofenac (CATAFLAM) 50 MG tablet Take 50 mg by mouth 3 (three) times daily.   Yes Historical Provider, MD  amoxicillin (AMOXIL) 500 MG tablet Take 1 tablet (500 mg total) by mouth 3 (three) times daily. Patient not taking: Reported on 04/20/2015 04/04/15   Kathyrn Drown, MD  aspirin 81 MG tablet Take 81 mg by mouth daily.    Historical Provider, MD  enalapril (VASOTEC) 10 MG tablet TAKE 1 TABLET (10 MG TOTAL) BY  MOUTH DAILY. Patient not taking: Reported on 04/20/2015 08/14/14   Kathyrn Drown, MD  Multiple Vitamins-Minerals (MULTIVITAMIN WITH MINERALS) tablet Take 1 tablet by mouth daily.    Historical Provider, MD  PATADAY 0.2 % SOLN  06/28/13   Historical Provider, MD  sulfacetamide (BLEPH-10) 10 % ophthalmic solution 2 drops in the affected eye 4 times daily for 3-5 days 05/01/15   Mikey Kirschner, MD   BP 132/85 mmHg  Pulse 61  Resp 18  SpO2 97%   Physical Exam  Constitutional: He is oriented to person, place, and time. He appears well-developed and well-nourished.  HENT:  Head: Normocephalic and atraumatic.  Eyes: EOM are normal.  Cardiovascular: Normal rate and regular rhythm.   Pulmonary/Chest: Effort normal.  Abdominal: Soft.  Musculoskeletal: Normal range of motion.  Right Upper Extremity: - No atrophy - Skin: No abrasions, no lacerations, no ecchymosis - Motor: Full ROM at shoulder, elbow, wrist; 5/5 wrist flexion/extension, thumb, IP joint flexion/extension (AIN/PIN), abduction/adduction (ulnar)   - Sensation intact to median/ulnar/radial nerves - 2+ radial pulse, <2 sec cap refill x 5 digits -TTP Right Biceps. Flexion/extension intact    Neurological: He is alert and oriented to person, place, and time.  Skin: Skin is warm and dry.  Psychiatric: He has a normal mood and affect. His behavior is normal. Thought  content normal.  Nursing note and vitals reviewed.   ED Course  Procedures (including critical care time) Labs Review Labs Reviewed - No data to display  Imaging Review No results found. I have personally reviewed and evaluated these images and lab results as part of my medical decision-making.   EKG Interpretation None      MDM  I have reviewed and evaluated the relevant imaging studies. I have reviewed the relevant previous healthcare records. I obtained HPI from historian. Patient discussed with supervising physician  ED Course:  Assessment: Pt is a 64yM  who presents with right bicep pain. On exam, pt in NAD. Nontoxic/nonseptic appearing. VSS. Afebrile. Right bicep with swelling noted. TTP. US showed possible medial biceps tendon rupture. Concurrent with physical exam. Pt able to flex and extend arm passively and actively with some discomfort. Will refer to previous Orthopedic Surgeon (Dr. Ninfa Linden) for further management of injury. At time of discharge, Patient is in no acute distress. Vital Signs are stable. Patient is able to ambulate. Patient able to tolerate PO.   Disposition/Plan:  DC Home Additional Verbal discharge instructions given and discussed with patient.  Pt Instructed to f/u with Orthopedics for evaluation and treatment of symptoms. Return precautions given Pt acknowledges and agrees with plan  Supervising Physician Leo Grosser, MD   Final diagnoses:  Biceps tendon rupture, right, initial encounter     Shary Decamp, PA-C 07/07/15 1611  Leo Grosser, MD 07/08/15 [phone]

## 2015-07-07 NOTE — Discharge Instructions (Signed)
Please read and follow all provided instructions.  Your diagnoses today include:  1. Biceps tendon rupture, right, initial encounter    Tests performed today include:  Vital signs. See below for your results today.   Medications prescribed:   None   Home care instructions:  Follow any educational materials contained in this packet.  Remain non weight bearing in right arm. Wear sling.   Follow-up instructions: Please follow-up with Dr. Ninfa Linden as soon as possible for further evaluation of symptoms and treatment   Return instructions:   Please return to the Emergency Department if you do not get better, if you get worse, or new symptoms OR  - Fever (temperature greater than 101.34F)  - Bleeding that does not stop with holding pressure to the area    -Severe pain (please note that you may be more sore the day after your accident)  - Chest Pain  - Difficulty breathing  - Severe nausea or vomiting  - Inability to tolerate food and liquids  - Passing out  - Skin becoming red around your wounds  - Change in mental status (confusion or lethargy)  - New numbness or weakness     Please return if you have any other emergent concerns.  Additional Information:  Your vital signs today were: There were no vitals taken for this visit. If your blood pressure (BP) was elevated above 135/85 this visit, please have this repeated by your doctor within one month. ---------------

## 2015-07-07 NOTE — ED Provider Notes (Signed)
Medical screening examination/treatment/procedure(s) were conducted as a shared visit with non-physician practitioner(s) and myself.  I personally evaluated the patient during the encounter.   EKG Interpretation None     65 y.o. male presents with classic popeye deformity after lifting last night with overlying bruising. Clinically c/w biceps rupture confirmed by bedside US. Defer to orthopedics for further management. NWB RUE.   See related encounter note  Emergency Focused Ultrasound Exam Limited Ultrasound of Soft Tissue   Performed and interpreted by Dr. Laneta Simmers Indication: evaluation for biceps rupture Transverse and Sagittal views of right biceps are obtained in real time for the purposes of evaluation of musculature.  Findings: + homogeneous fluid collection with free floating tendon proximal to muscle body Interpretation: ruptured long head of biceps  Images archived electronically.  CPT Codes:   Upper extremity [id number] Other soft tissue [id number]    Leo Grosser, MD 07/08/15 [phone]

## 2015-07-07 NOTE — ED Notes (Signed)
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

## 2015-07-07 NOTE — ED Notes (Signed)
Pt c/o right bicep pain after moving a trailer part at work last night.

## 2015-07-18 ENCOUNTER — Telehealth (HOSPITAL_COMMUNITY): Payer: Self-pay

## 2015-07-18 NOTE — Telephone Encounter (Signed)
pt called to say he received a bill for our services and he wanted to know why b/c at his first visit he was told that his insurance would cover him at 100%. Checked the notes on Eval date and that is what its states, covered at 100%. NF 07/18/15

## 2015-07-30 ENCOUNTER — Encounter: Payer: Self-pay | Admitting: Family Medicine

## 2015-07-30 ENCOUNTER — Ambulatory Visit (INDEPENDENT_AMBULATORY_CARE_PROVIDER_SITE_OTHER): Payer: Managed Care, Other (non HMO) | Admitting: Family Medicine

## 2015-07-30 ENCOUNTER — Other Ambulatory Visit: Payer: Self-pay | Admitting: *Deleted

## 2015-07-30 VITALS — BP 122/82 | Ht 72.0 in | Wt 257.2 lb

## 2015-07-30 DIAGNOSIS — I10 Essential (primary) hypertension: Secondary | ICD-10-CM | POA: Diagnosis not present

## 2015-07-30 DIAGNOSIS — Z79899 Other long term (current) drug therapy: Secondary | ICD-10-CM | POA: Diagnosis not present

## 2015-07-30 DIAGNOSIS — Z1211 Encounter for screening for malignant neoplasm of colon: Secondary | ICD-10-CM

## 2015-07-30 DIAGNOSIS — Z13220 Encounter for screening for lipoid disorders: Secondary | ICD-10-CM | POA: Diagnosis not present

## 2015-07-30 DIAGNOSIS — Z125 Encounter for screening for malignant neoplasm of prostate: Secondary | ICD-10-CM | POA: Diagnosis not present

## 2015-07-30 DIAGNOSIS — Z0000 Encounter for general adult medical examination without abnormal findings: Secondary | ICD-10-CM

## 2015-07-30 MED ORDER — ENALAPRIL MALEATE 10 MG PO TABS: ORAL_TABLET | ORAL | Status: DC

## 2015-07-30 NOTE — Progress Notes (Signed)
   Subjective:    Patient ID: Fernando Riley, male    DOB: 07-Nov-1950, 65 y.o.   MRN: [id number]  HPI The patient comes in today for a wellness visit.    A review of their health history was completed.  A review of medications was also completed.  Any needed refills: enalapril. Does not miss a dose meds reviewed today compliant with meds  Eating habits: good  Falls/  MVA accidents in past few months: none  Regular exercise: yes, walking. Not exercising as much, nstill walking around three times per wk,  Diet now watching closely and feeling good with that,  Specialist pt sees on regular basis: none  Preventative health issues were discussed.  Shoulder pain causign difficulty sleeping  Waking pt up  Additional concerns: not resting well , part of this stress part of his shoulder pain patient prefer not to take any medicine and I would prefer not to give many  Last colonoscopy was 10 years ago.  BP overall not checked  meds faithfully,  Sleep study finally to be done within the next week.   Review of Systems  Constitutional: Negative for fever, activity change and appetite change.  HENT: Negative for congestion and rhinorrhea.   Eyes: Negative for discharge.  Respiratory: Negative for cough and wheezing.   Cardiovascular: Negative for chest pain.  Gastrointestinal: Negative for vomiting, abdominal pain and blood in stool.  Genitourinary: Negative for frequency and difficulty urinating.  Musculoskeletal: Negative for neck pain.  Skin: Negative for rash.  Allergic/Immunologic: Negative for environmental allergies and food allergies.  Neurological: Negative for weakness and headaches.  Psychiatric/Behavioral: Negative for agitation.  All other systems reviewed and are negative.      Objective:   Physical Exam  Constitutional: He appears well-developed and well-nourished.  HENT:  Head: Normocephalic and atraumatic.  Right Ear: External ear normal.  Left Ear:  External ear normal.  Nose: Nose normal.  Mouth/Throat: Oropharynx is clear and moist.  Eyes: EOM are normal. Pupils are equal, round, and reactive to light.  Neck: Normal range of motion. Neck supple. No thyromegaly present.  Cardiovascular: Normal rate, regular rhythm and normal heart sounds.   No murmur heard. Pulmonary/Chest: Effort normal and breath sounds normal. No respiratory distress. He has no wheezes.  Abdominal: Soft. Bowel sounds are normal. He exhibits no distension and no mass. There is no tenderness.  Genitourinary: Penis normal.  Musculoskeletal: Normal range of motion. He exhibits no edema.  Lymphadenopathy:    He has no cervical adenopathy.  Neurological: He is alert. He exhibits normal muscle tone.  Skin: Skin is warm and dry. No erythema.  Psychiatric: He has a normal mood and affect. His behavior is normal. Judgment normal.  Vitals reviewed. obesity present, crepitations and knees. Trace edema of ankles with slight varicosities. Blood pressure repeat good at 134/78        Assessment & Plan:  Impression 1 wellness exam anticipatory guidance given #2 hypertension good control maintain same meds rationale discussed over 3 probable sleep apnea await results of sleep study plan appropriate blood work diet exercise discussed in encourage. Colonoscopy sheet given patient due to its 10 years he will call Dr. Work and set this up. Blood pressure meds refilled recheck in 6 months

## 2015-07-31 ENCOUNTER — Encounter: Payer: Self-pay | Admitting: Neurology

## 2015-07-31 ENCOUNTER — Ambulatory Visit: Payer: Managed Care, Other (non HMO) | Attending: Family Medicine | Admitting: Sleep Medicine

## 2015-07-31 ENCOUNTER — Encounter: Payer: Self-pay | Admitting: Family Medicine

## 2015-07-31 DIAGNOSIS — R0683 Snoring: Secondary | ICD-10-CM | POA: Insufficient documentation

## 2015-07-31 DIAGNOSIS — G4730 Sleep apnea, unspecified: Secondary | ICD-10-CM | POA: Insufficient documentation

## 2015-07-31 DIAGNOSIS — R5383 Other fatigue: Secondary | ICD-10-CM | POA: Diagnosis present

## 2015-08-02 LAB — HEPATIC FUNCTION PANEL
ALBUMIN: 3.8 g/dL (ref 3.6–4.8)
ALT: 20 IU/L (ref 0–44)
AST: 15 IU/L (ref 0–40)
Alkaline Phosphatase: 85 IU/L (ref 39–117)
Bilirubin Total: 0.6 mg/dL (ref 0.0–1.2)
Bilirubin, Direct: 0.15 mg/dL (ref 0.00–0.40)
TOTAL PROTEIN: 6.2 g/dL (ref 6.0–8.5)

## 2015-08-02 LAB — BASIC METABOLIC PANEL
BUN / CREAT RATIO: 18 (ref 10–24)
BUN: 16 mg/dL (ref 8–27)
CHLORIDE: 104 mmol/L (ref 96–106)
CO2: 22 mmol/L (ref 18–29)
CREATININE: 0.91 mg/dL (ref 0.76–1.27)
Calcium: 8.7 mg/dL (ref 8.6–10.2)
GFR calc Af Amer: 104 mL/min/{1.73_m2} (ref >59)
GFR calc non Af Amer: 90 mL/min/{1.73_m2} (ref >59)
GLUCOSE: 91 mg/dL (ref 65–99)
Potassium: 4 mmol/L (ref 3.5–5.2)
SODIUM: 141 mmol/L (ref 134–144)

## 2015-08-02 LAB — PSA: PROSTATE SPECIFIC AG, SERUM: 0.3 ng/mL (ref 0.0–4.0)

## 2015-08-02 LAB — LIPID PANEL
Chol/HDL Ratio: 3.4 ratio units (ref 0.0–5.0)
Cholesterol, Total: 186 mg/dL (ref 100–199)
HDL: 55 mg/dL (ref >39)
LDL CALC: 114 mg/dL — AB (ref 0–99)
Triglycerides: 87 mg/dL (ref 0–149)
VLDL CHOLESTEROL CAL: 17 mg/dL (ref 5–40)

## 2015-08-03 ENCOUNTER — Encounter: Payer: Self-pay | Admitting: Family Medicine

## 2015-08-10 NOTE — Sleep Study (Signed)
  [place] A. Merlene Laughter, MD     [email or web address]             HOME SLEEP STUDY  LOCATION: [place]   Demographics Edit Patient Name: Fernando Riley, Fernando Riley Date: 07/31/2015 Gender: Male D.O.B: 1950/11/11 Age (years): 86 Referring Provider: Not Available Height (inches): 72 Interpreting Physician: Phillips Odor MD, ABSM Weight (lbs): 255 RPSGT: Peak, Robert BMI: 35 MRN: [id number] Neck Size:    CLINICAL INFORMATION EditMoveRemove this section Sleep Study Type: HST Indication for sleep study: Fatigue, Snoring Epworth Sleepiness Score: SLEEP STUDY TECHNIQUE EditMoveRemove this section A multi-channel overnight portable sleep study was performed. The channels recorded were: nasal airflow, thoracic respiratory movement, and oxygen saturation with a pulse oximetry. Snoring was also monitored. MEDICATIONS   Current outpatient prescriptions:  .  amoxicillin (AMOXIL) 500 MG tablet, Take 1 tablet (500 mg total) by mouth 3 (three) times daily. (Patient not taking: Reported on 04/20/2015), Disp: 30 tablet, Rfl: 0 .  aspirin 81 MG tablet, Take 81 mg by mouth daily., Disp: , Rfl:  .  diclofenac (CATAFLAM) 50 MG tablet, Take 50 mg by mouth 3 (three) times daily., Disp: , Rfl:  .  enalapril (VASOTEC) 10 MG tablet, TAKE 1 TABLET (10 MG TOTAL) BY MOUTH DAILY., Disp: 90 tablet, Rfl: 1 .  Multiple Vitamins-Minerals (MULTIVITAMIN WITH MINERALS) tablet, Take 1 tablet by mouth daily., Disp: , Rfl:  .  PATADAY 0.2 % SOLN, , Disp: , Rfl:  .  sulfacetamide (BLEPH-10) 10 % ophthalmic solution, 2 drops in the affected eye 4 times daily for 3-5 days (Patient not taking: Reported on 07/30/2015), Disp: 15 mL, Rfl: 0  Patient self administered medications include: N/A. SLEEP ARCHITECTURE EditMoveRemove this section Patient was studied for 479.2 minutes. The sleep efficiency was 100.0 % and the patient was supine for 0%. The arousal index was 0.0 per hour. RESPIRATORY PARAMETERS EditMoveRemove  this section The overall AHI was 16.9 per hour, with a central apnea index of 1.6 per hour. The oxygen nadir was 78% during sleep. CARDIAC DATA EditMoveRemove this section Mean heart rate during sleep was bpm.    IMPRESSIONS  Moderate obstructive sleep apnea occurred during this study (AHI = 16.9/h). Formal CPAP titration study recommended.     Fernando Metz, MD Diplomate, American Board of Sleep Medicine.

## 2015-08-16 ENCOUNTER — Telehealth: Payer: Self-pay | Admitting: *Deleted

## 2015-08-16 NOTE — Telephone Encounter (Signed)
[facility] - dr Richardson Landry wants pt to come in for an office visit to discuss sleep study results.

## 2015-08-17 NOTE — Telephone Encounter (Signed)
[facility] 08/17/15 

## 2015-08-20 ENCOUNTER — Encounter: Payer: Self-pay | Admitting: Family Medicine

## 2015-08-20 ENCOUNTER — Ambulatory Visit (INDEPENDENT_AMBULATORY_CARE_PROVIDER_SITE_OTHER): Payer: Managed Care, Other (non HMO) | Admitting: Family Medicine

## 2015-08-20 VITALS — BP 138/84 | Ht 72.0 in | Wt 255.0 lb

## 2015-08-20 DIAGNOSIS — G4733 Obstructive sleep apnea (adult) (pediatric): Secondary | ICD-10-CM | POA: Insufficient documentation

## 2015-08-20 NOTE — Telephone Encounter (Signed)
Discussed with patient. Patient scheduled office visit with Dr Richardson Landry to discuss sleep study results.

## 2015-08-20 NOTE — Progress Notes (Signed)
   Subjective:    Patient ID: Fernando Riley, male    DOB: 1950-10-08, 65 y.o.   MRN: [id number]  HPIpt arrives to go over sleep study results.   Sleep study revealed AHI of 16.9. Next  Long-standing history of daytime drowsiness and easy to go to sleep during the day and daytime fatigue. Also major snoring with major respiratory pattern changes at night  Did a home study which confirmed moderate obstructive sleep apnea.  Sleep specialist recommended considering a formal sleep study but patient would like to avoid this if possible  Pt states no other concerns today.     Review of Systems Results for orders placed or performed in visit on 07/30/15  Lipid panel  Result Value Ref Range   Cholesterol, Total 186 100 - 199 mg/dL   Triglycerides 87 0 - 149 mg/dL   HDL 55 >39 mg/dL   VLDL Cholesterol Cal 17 5 - 40 mg/dL   LDL Calculated 114 (H) 0 - 99 mg/dL   Chol/HDL Ratio 3.4 0.0 - 5.0 ratio units  Hepatic function panel  Result Value Ref Range   Total Protein 6.2 6.0 - 8.5 g/dL   Albumin 3.8 3.6 - 4.8 g/dL   Bilirubin Total 0.6 0.0 - 1.2 mg/dL   Bilirubin, Direct 0.15 0.00 - 0.40 mg/dL   Alkaline Phosphatase 85 39 - 117 IU/L   AST 15 0 - 40 IU/L   ALT 20 0 - 44 IU/L  PSA  Result Value Ref Range   Prostate Specific Ag, Serum 0.3 0.0 - 4.0 ng/mL  Basic metabolic panel  Result Value Ref Range   Glucose 91 65 - 99 mg/dL   BUN 16 8 - 27 mg/dL   Creatinine, Ser 0.91 0.76 - 1.27 mg/dL   GFR calc non Af Amer 90 >59 mL/min/1.73   GFR calc Af Amer 104 >59 mL/min/1.73   BUN/Creatinine Ratio 18 10 - 24   Sodium 141 134 - 144 mmol/L   Potassium 4.0 3.5 - 5.2 mmol/L   Chloride 104 96 - 106 mmol/L   CO2 22 18 - 29 mmol/L   Calcium 8.7 8.6 - 10.2 mg/dL       Objective:   Physical Exam  Alert vital stable HEENT normal lungs clear. Heart regular in rhythm.      Assessment & Plan:  Impression obstructive sleep apnea discussed at length including results plan auto titration C Pap  machine prescribed. With nasal mass can cannula prescription written [facility]

## 2015-09-25 ENCOUNTER — Telehealth: Payer: Self-pay

## 2015-09-25 NOTE — Telephone Encounter (Signed)
[phone]  PATIENT RECEIVED LETTER TO SCHEDULE TCS

## 2015-09-26 NOTE — Telephone Encounter (Signed)
Tried to call pt and no answer.

## 2015-10-01 NOTE — Telephone Encounter (Signed)
LMOM to call.

## 2015-10-10 NOTE — Telephone Encounter (Signed)
Mailed letter for pt to call.

## 2015-10-25 ENCOUNTER — Telehealth: Payer: Self-pay

## 2015-10-25 NOTE — Telephone Encounter (Signed)
Patient called and stated that his wife will be able to schedule his tcs because of his sleep schedule

## 2015-10-25 NOTE — Telephone Encounter (Signed)
Pt's wife is aware pt is on recall for 01/2016. She prefers to wait until then to make sure the insurance will cover it. I have him on my list to call in September.

## 2015-11-05 ENCOUNTER — Encounter: Payer: Self-pay | Admitting: Family Medicine

## 2015-11-16 ENCOUNTER — Encounter: Payer: Self-pay | Admitting: Family Medicine

## 2015-11-16 ENCOUNTER — Ambulatory Visit (INDEPENDENT_AMBULATORY_CARE_PROVIDER_SITE_OTHER): Payer: Managed Care, Other (non HMO) | Admitting: Family Medicine

## 2015-11-16 VITALS — BP 130/74 | Temp 100.5°F | Ht 72.0 in | Wt 254.0 lb

## 2015-11-16 DIAGNOSIS — J029 Acute pharyngitis, unspecified: Secondary | ICD-10-CM

## 2015-11-16 DIAGNOSIS — B349 Viral infection, unspecified: Secondary | ICD-10-CM | POA: Insufficient documentation

## 2015-11-16 LAB — POCT RAPID STREP A (OFFICE): Rapid Strep A Screen: NEGATIVE

## 2015-11-16 MED ORDER — DOXYCYCLINE HYCLATE 100 MG PO TABS: 100.0000 mg | ORAL_TABLET | Freq: Two times a day (BID) | ORAL | 0 refills | Status: DC

## 2015-11-16 NOTE — Progress Notes (Signed)
   Subjective:    Patient ID: Fernando Riley, male    DOB: 1950-06-20, 65 y.o.   MRN: [id number]  Sinusitis  This is a new problem. The maximum temperature recorded prior to his arrival was 100.4 - 100.9 F. Associated symptoms include congestion, coughing, headaches and a sore throat. (Exposed to strep throat) Past treatments include nothing.   Very achey knees hips an dback   Noted flushing and headache and fever  Felt really bad  Has had cough with deep breath   Mil d sore.   Review of Systems  HENT: Positive for congestion and sore throat.   Respiratory: Positive for cough.   Neurological: Positive for headaches.       Objective:   Physical Exam Alert vitals stable, NAD. Blood pressure good on repeat. HEENT normal. Lungs clear. Heart regular rate and rhythm. Some nasal congestion. Mild malaise       Assessment & Plan:  Impression febrile illness with viral-like symptomatology. High likelihood viral discussed. Will cover with doc see for slight chance of tickborne illness and bacterial respiratory infection discussed [facility]

## 2015-11-16 NOTE — Progress Notes (Signed)
   Subjective:    Patient ID: Fernando Riley, male    DOB: 10-03-1950, 65 y.o.   MRN: [id number]  HPI    Review of Systems     Objective:   Physical Exam        Assessment & Plan:

## 2015-11-17 LAB — STREP A DNA PROBE: STREP GP A DIRECT, DNA PROBE: NEGATIVE

## 2015-11-26 ENCOUNTER — Encounter: Payer: Self-pay | Admitting: Family Medicine

## 2015-12-09 ENCOUNTER — Encounter: Payer: Self-pay | Admitting: Family Medicine

## 2015-12-13 ENCOUNTER — Telehealth: Payer: Self-pay | Admitting: Internal Medicine

## 2015-12-13 NOTE — Telephone Encounter (Signed)
Pt is due 10 yr colonoscopy °

## 2015-12-13 NOTE — Telephone Encounter (Signed)
Letter mailed to pt.  

## 2015-12-24 ENCOUNTER — Telehealth: Payer: Self-pay

## 2015-12-24 NOTE — Telephone Encounter (Signed)
[phone]  PATIENT RECEIVED LETTER TO SCHEDULE TCS

## 2015-12-25 ENCOUNTER — Other Ambulatory Visit: Payer: Self-pay

## 2015-12-25 DIAGNOSIS — Z1211 Encounter for screening for malignant neoplasm of colon: Secondary | ICD-10-CM

## 2015-12-25 NOTE — Telephone Encounter (Signed)
See triage

## 2015-12-25 NOTE — Telephone Encounter (Signed)
Ok to schedule. Notify endo of sleep apnea. 

## 2015-12-25 NOTE — Telephone Encounter (Signed)
Gastroenterology Pre-Procedure Review  Request Date: 12/24/2015 Requesting Physician: PT WAS ON 10 YEAR RECALL   PATIENT REVIEW QUESTIONS: The patient responded to the following health history questions as indicated:    LAST COLONOSCOPY WAS 01/30/2006 BY DR ROURK  1. Diabetes Melitis: no 2. Joint replacements in the past 12 months: no 3. Major health problems in the past 3 months: no 4. Has an artificial valve or MVP: no 5. Has a defibrillator: no 6. Has been advised in past to take antibiotics in advance of a procedure like teeth cleaning: no 7. Family history of colon cancer: no  8. Alcohol Use: no 9. History of sleep apnea: YES AND HAS A C-PAP    MEDICATIONS & ALLERGIES:    Patient reports the following regarding taking any blood thinners:   Plavix? no Aspirin? yes Coumadin? no  Patient confirms/reports the following medications:  Current Outpatient Prescriptions  Medication Sig Dispense Refill  . aspirin 81 MG tablet Take 81 mg by mouth daily.    . diclofenac (CATAFLAM) 50 MG tablet Take 50 mg by mouth 3 (three) times daily.    . enalapril (VASOTEC) 10 MG tablet TAKE 1 TABLET (10 MG TOTAL) BY MOUTH DAILY. 90 tablet 1  . PATADAY 0.2 % SOLN      No current facility-administered medications for this visit.     Patient confirms/reports the following allergies:  Allergies  Allergen Reactions  . Other     Cats  . Red Dye     No orders of the defined types were placed in this encounter.   AUTHORIZATION INFORMATION Primary Insurance:   ID #:  Group #:  Pre-Cert / Auth required:  Pre-Cert / Auth #:   Secondary Insurance:   ID #:  Group #:  Pre-Cert / Auth required: Pre-Cert / Auth #:   SCHEDULE INFORMATION: Procedure has been scheduled as follows:  Date: 01/21/2016      Time:  1:00 pm Location: [facility] Short Stay  This Gastroenterology Pre-Precedure Review Form is being routed to the following provider(s): R. Garfield Cornea, MD

## 2015-12-31 MED ORDER — NA SULFATE-K SULFATE-MG SULF 17.5-3.13-1.6 GM/177ML PO SOLN: 1.0000 | ORAL | 0 refills | Status: DC

## 2015-12-31 NOTE — Telephone Encounter (Signed)
Rx sent to the pharmacy and instructions mailed to pt.  Fernando Riley is aware about the C-Pap.

## 2015-12-31 NOTE — Addendum Note (Signed)
Addended by: Everardo All on: 12/31/2015 03:30 PM   Modules accepted: Orders

## 2016-01-16 ENCOUNTER — Telehealth: Payer: Self-pay

## 2016-01-16 NOTE — Telephone Encounter (Signed)
I called [employer] at [phone] and got automation. NO PA required for the CPT code of 09811. Reference # [id number].

## 2016-01-18 ENCOUNTER — Telehealth: Payer: Self-pay

## 2016-01-18 NOTE — Telephone Encounter (Signed)
Called pt to change time for TCS 01/21/16. Spoke to his wife and she said it was ok to change time. Pt to arrive at hospital at 11:30am. Advised his wife to let him know to start drinking prep the morning of procedure at 7:30am and nothing after 9:30am. Wife verbalized understanding and will inform pt.

## 2016-01-21 ENCOUNTER — Encounter (HOSPITAL_COMMUNITY)
Admission: RE | Disposition: A | Discharge status: Home / Self Care | Payer: Self-pay | Source: Ambulatory Visit | Attending: Internal Medicine

## 2016-01-21 ENCOUNTER — Ambulatory Visit (HOSPITAL_COMMUNITY)
Admission: RE | Admit: 2016-01-21 | Discharge: 2016-01-21 | Disposition: A | Discharge status: Home / Self Care | Payer: Managed Care, Other (non HMO) | Source: Ambulatory Visit | Attending: Internal Medicine | Admitting: Internal Medicine

## 2016-01-21 ENCOUNTER — Encounter (HOSPITAL_COMMUNITY): Payer: Self-pay | Admitting: *Deleted

## 2016-01-21 DIAGNOSIS — Z1211 Encounter for screening for malignant neoplasm of colon: Secondary | ICD-10-CM | POA: Diagnosis not present

## 2016-01-21 DIAGNOSIS — I10 Essential (primary) hypertension: Secondary | ICD-10-CM | POA: Diagnosis not present

## 2016-01-21 DIAGNOSIS — K621 Rectal polyp: Secondary | ICD-10-CM

## 2016-01-21 DIAGNOSIS — Z1212 Encounter for screening for malignant neoplasm of rectum: Secondary | ICD-10-CM

## 2016-01-21 DIAGNOSIS — Z79899 Other long term (current) drug therapy: Secondary | ICD-10-CM | POA: Insufficient documentation

## 2016-01-21 DIAGNOSIS — K5730 Diverticulosis of large intestine without perforation or abscess without bleeding: Secondary | ICD-10-CM | POA: Insufficient documentation

## 2016-01-21 DIAGNOSIS — Z7982 Long term (current) use of aspirin: Secondary | ICD-10-CM | POA: Insufficient documentation

## 2016-01-21 HISTORY — PX: POLYPECTOMY: SHX5525

## 2016-01-21 HISTORY — PX: COLONOSCOPY: SHX5424

## 2016-01-21 SURGERY — COLONOSCOPY
Anesthesia: Moderate Sedation

## 2016-01-21 MED ORDER — STERILE WATER FOR IRRIGATION IR SOLN
Status: DC | PRN
  Administered 2016-01-21: 12:00:00

## 2016-01-21 MED ORDER — MEPERIDINE HCL 100 MG/ML IJ SOLN
INTRAMUSCULAR | Status: DC | PRN
  Administered 2016-01-21: 25 mg via INTRAVENOUS
  Administered 2016-01-21: 50 mg via INTRAVENOUS

## 2016-01-21 MED ORDER — SODIUM CHLORIDE 0.9 % IV SOLN
INTRAVENOUS | Status: DC
  Administered 2016-01-21: 12:00:00 via INTRAVENOUS

## 2016-01-21 MED ORDER — MIDAZOLAM HCL 5 MG/5ML IJ SOLN
INTRAMUSCULAR | Status: AC
  Filled 2016-01-21: qty 10

## 2016-01-21 MED ORDER — ONDANSETRON HCL 4 MG/2ML IJ SOLN
INTRAMUSCULAR | Status: AC
  Filled 2016-01-21: qty 2

## 2016-01-21 MED ORDER — MEPERIDINE HCL 100 MG/ML IJ SOLN
INTRAMUSCULAR | Status: AC
  Filled 2016-01-21: qty 2

## 2016-01-21 MED ORDER — MIDAZOLAM HCL 5 MG/5ML IJ SOLN
INTRAMUSCULAR | Status: DC | PRN
Start: 2016-01-21 — End: 2016-01-21
  Administered 2016-01-21: 2 mg via INTRAVENOUS
  Administered 2016-01-21: 1 mg via INTRAVENOUS
  Administered 2016-01-21: 2 mg via INTRAVENOUS

## 2016-01-21 MED ORDER — ONDANSETRON HCL 4 MG/2ML IJ SOLN
INTRAMUSCULAR | Status: DC | PRN
Start: 2016-01-21 — End: 2016-01-21
  Administered 2016-01-21: 4 mg via INTRAVENOUS

## 2016-01-21 NOTE — Discharge Instructions (Addendum)
°Colonoscopy °Discharge Instructions ° °Read the instructions outlined below and refer to this sheet in the next few weeks. These discharge instructions provide you with general information on caring for yourself after you leave the hospital. Your doctor may also give you specific instructions. While your treatment has been planned according to the most current medical practices available, unavoidable complications occasionally occur. If you have any problems or questions after discharge, call Dr. Rourk at [phone]. °ACTIVITY °· You may resume your regular activity, but move at a slower pace for the next 24 hours.  °· Take frequent rest periods for the next 24 hours.  °· Walking will help get rid of the air and reduce the bloated feeling in your belly (abdomen).  °· No driving for 24 hours (because of the medicine (anesthesia) used during the test).   °· Do not sign any important legal documents or operate any machinery for 24 hours (because of the anesthesia used during the test).  °NUTRITION °· Drink plenty of fluids.  °· You may resume your normal diet as instructed by your doctor.  °· Begin with a light meal and progress to your normal diet. Heavy or fried foods are harder to digest and may make you feel sick to your stomach (nauseated).  °· Avoid alcoholic beverages for 24 hours or as instructed.  °MEDICATIONS °· You may resume your normal medications unless your doctor tells you otherwise.  °WHAT YOU CAN EXPECT TODAY °· Some feelings of bloating in the abdomen.  °· Passage of more gas than usual.  °· Spotting of blood in your stool or on the toilet paper.  °IF YOU HAD POLYPS REMOVED DURING THE COLONOSCOPY: °· No aspirin products for 7 days or as instructed.  °· No alcohol for 7 days or as instructed.  °· Eat a soft diet for the next 24 hours.  °FINDING OUT THE RESULTS OF YOUR TEST °Not all test results are available during your visit. If your test results are not back during the visit, make an appointment  with your caregiver to find out the results. Do not assume everything is normal if you have not heard from your caregiver or the medical facility. It is important for you to follow up on all of your test results.  °SEEK IMMEDIATE MEDICAL ATTENTION IF: °· You have more than a spotting of blood in your stool.  °· Your belly is swollen (abdominal distention).  °· You are nauseated or vomiting.  °· You have a temperature over 101.  °· You have abdominal pain or discomfort that is severe or gets worse throughout the day.  ° ° °Colon polyp and diverticulosis information provided ° °Further recommendations to follow pending review of pathology report ° ° °Diverticulosis °Diverticulosis is the condition that develops when small pouches (diverticula) form in the wall of your colon. Your colon, or large intestine, is where water is absorbed and stool is formed. The pouches form when the inside layer of your colon pushes through weak spots in the outer layers of your colon. °CAUSES  °No one knows exactly what causes diverticulosis. °RISK FACTORS °· Being older than 50. Your risk for this condition increases with age. Diverticulosis is rare in people younger than 40 years. By age 80, almost everyone has it. °· Eating a low-fiber diet. °· Being frequently constipated. °· Being overweight. °· Not getting enough exercise. °· Smoking. °· Taking over-the-counter pain medicines, like aspirin and ibuprofen. °SYMPTOMS  °Most people with diverticulosis do not have symptoms. °DIAGNOSIS  °  Because diverticulosis often has no symptoms, health care providers often discover the condition during an exam for other colon problems. In many cases, a health care provider will diagnose diverticulosis while using a flexible scope to examine the colon (colonoscopy). °TREATMENT  °If you have never developed an infection related to diverticulosis, you may not need treatment. If you have had an infection before, treatment may include: °· Eating more  fruits, vegetables, and grains. °· Taking a fiber supplement. °· Taking a live bacteria supplement (probiotic). °· Taking medicine to relax your colon. °HOME CARE INSTRUCTIONS  °· Drink at least 6-8 glasses of water each day to prevent constipation. °· Try not to strain when you have a bowel movement. °· Keep all follow-up appointments. °If you have had an infection before:  °· Increase the fiber in your diet as directed by your health care provider or dietitian. °· Take a dietary fiber supplement if your health care provider approves. °· Only take medicines as directed by your health care provider. °SEEK MEDICAL CARE IF:  °· You have abdominal pain. °· You have bloating. °· You have cramps. °· You have not gone to the bathroom in 3 days. °SEEK IMMEDIATE MEDICAL CARE IF:  °· Your pain gets worse. °· Your bloating becomes very bad. °· You have a fever or chills, and your symptoms suddenly get worse. °· You begin vomiting. °· You have bowel movements that are bloody or black. °MAKE SURE YOU: °· Understand these instructions. °· Will watch your condition. °· Will get help right away if you are not doing well or get worse. °  °This information is not intended to replace advice given to you by your health care provider. Make sure you discuss any questions you have with your health care provider. °  °Document Released: 12/27/2003 Document Revised: 04/05/2013 Document Reviewed: 02/23/2013 °Elsevier Interactive Patient Education ©2016 [employer] °Colon Polyps °Polyps are lumps of extra tissue growing inside the body. Polyps can grow in the large intestine (colon). Most colon polyps are noncancerous (benign). However, some colon polyps can become cancerous over time. Polyps that are larger than a pea may be harmful. To be safe, caregivers remove and test all polyps. °CAUSES  °Polyps form when mutations in the genes cause your cells to grow and divide even though no more tissue is needed. °RISK FACTORS °There are a number  of risk factors that can increase your chances of getting colon polyps. They include: °· Being older than 50 years. °· Family history of colon polyps or colon cancer. °· Long-term colon diseases, such as colitis or Crohn disease. °· Being overweight. °· Smoking. °· Being inactive. °· Drinking too much alcohol. °SYMPTOMS  °Most small polyps do not cause symptoms. If symptoms are present, they may include: °· Blood in the stool. The stool may look dark red or black. °· Constipation or diarrhea that lasts longer than 1 week. °DIAGNOSIS °People often do not know they have polyps until their caregiver finds them during a regular checkup. Your caregiver can use 4 tests to check for polyps: °· Digital rectal exam. The caregiver wears gloves and feels inside the rectum. This test would find polyps only in the rectum. °· Barium enema. The caregiver puts a liquid called barium into your rectum before taking X-rays of your colon. Barium makes your colon look white. Polyps are dark, so they are easy to see in the X-ray pictures. °· Sigmoidoscopy. A thin, flexible tube (sigmoidoscope) is placed into your rectum. The sigmoidoscope has a   light and tiny camera in it. The caregiver uses the sigmoidoscope to look at the last third of your colon. °· Colonoscopy. This test is like sigmoidoscopy, but the caregiver looks at the entire colon. This is the most common method for finding and removing polyps. °TREATMENT  °Any polyps will be removed during a sigmoidoscopy or colonoscopy. The polyps are then tested for cancer. °PREVENTION  °To help lower your risk of getting more colon polyps: °· Eat plenty of fruits and vegetables. Avoid eating fatty foods. °· Do not smoke. °· Avoid drinking alcohol. °· Exercise every day. °· Lose weight if recommended by your caregiver. °· Eat plenty of calcium and folate. Foods that are rich in calcium include milk, cheese, and broccoli. Foods that are rich in folate include chickpeas, kidney beans, and  spinach. °HOME CARE INSTRUCTIONS °Keep all follow-up appointments as directed by your caregiver. You may need periodic exams to check for polyps. °SEEK MEDICAL CARE IF: °You notice bleeding during a bowel movement. °  °This information is not intended to replace advice given to you by your health care provider. Make sure you discuss any questions you have with your health care provider. °  °Document Released: 12/26/2003 Document Revised: 04/21/2014 Document Reviewed: 06/10/2011 °Elsevier Interactive Patient Education ©2016 Elsevier Inc. ° °

## 2016-01-21 NOTE — H&P (Signed)
_0 @   Primary Care Physician:  Mickie Hillier, MD Primary Gastroenterologist:  Dr. Gala Romney  Pre-Procedure History & Physical: HPI:  Fernando Riley is a 65 y.o. male is here for a screening colonoscopy. No bowel symptoms. No family history of colon cancer. Negative colonoscopy 2007  Past Medical History:  Diagnosis Date  . Cat allergies   . Fatty liver   . Hypertension   . Impaired fasting glucose   . Kidney stone   . Pollen allergies   . Reactive airways dysfunction syndrome   . Venous stasis     Past Surgical History:  Procedure Laterality Date  . COLONOSCOPY    . KNEE ARTHROSCOPY     x2  . KNEE ARTHROSCOPY    . SHOULDER SURGERY Right 2016    Prior to Admission medications   Medication Sig Start Date End Date Taking? Authorizing Provider  aspirin 81 MG tablet Take 81 mg by mouth daily.   Yes Historical Provider, MD  diclofenac (CATAFLAM) 50 MG tablet Take 50 mg by mouth 3 (three) times daily.   Yes Historical Provider, MD  enalapril (VASOTEC) 10 MG tablet TAKE 1 TABLET (10 MG TOTAL) BY MOUTH DAILY. 07/30/15  Yes Mikey Kirschner, MD  Na Sulfate-K Sulfate-Mg Sulf (SUPREP BOWEL PREP KIT) 17.5-3.13-1.6 GM/180ML SOLN Take 1 kit by mouth as directed. 12/31/15  Yes Daneil Dolin, MD  PATADAY 0.2 % SOLN  06/28/13  Yes Historical Provider, MD    Allergies as of 12/25/2015 - Review Complete 12/24/2015  Allergen Reaction Noted  . Other  03/17/2014  . Red dye  06/27/2011    Family History  Problem Relation Age of Onset  . Heart disease Father   . Diabetes Brother   . Hyperlipidemia Brother     Social History   Social History  . Marital status: Married    Spouse name: N/A  . Number of children: N/A  . Years of education: N/A   Occupational History  . Not on file.   Social History Main Topics  . Smoking status: Never Smoker  . Smokeless tobacco: Never Used  . Alcohol use No  . Drug use: No  . Sexual activity: Not on file   Other Topics Concern  . Not on file    Social History Narrative  . No narrative on file    Review of Systems: See HPI, otherwise negative ROS  Physical Exam: BP 128/80   Pulse (!) 58   Temp 98.8 F (37.1 C) (Oral)   Resp 12   Ht 6' (1.829 m)   Wt 245 lb (111.1 kg)   SpO2 97%   BMI 33.23 kg/m  General:   Alert,  Well-developed, well-nourished, pleasant and cooperative in NAD Head:  Normocephalic and atraumatic. Eyes:  Sclera clear, no icterus.   Conjunctiva pink. Lungs:  Clear throughout to auscultation.   No wheezes, crackles, or rhonchi. No acute distress. Heart:  Regular rate and rhythm; no murmurs, clicks, rubs,  or gallops. Abdomen:  Soft, nontender and nondistended. No masses, hepatosplenomegaly or hernias noted. Normal bowel sounds, without guarding, and without rebound.    Impression/Plan: Fernando Riley is now here to undergo a screening colonoscopy.  Average risk screening colonoscopy. Risks, benefits, limitations, imponderables and alternatives regarding colonoscopy have been reviewed with the patient. Questions have been answered. All parties agreeable.   tice:  This dictation was prepared with Dragon dictation along with smaller phrase technology. Any transcriptional errors that result from this process are unintentional and  may not be corrected upon review.

## 2016-01-21 NOTE — Op Note (Signed)
[facility] Patient Name: Fernando Riley Procedure Date: 01/21/2016 11:59 AM MRN: [id number] Date of Birth: 1950/07/21 Attending MD: Norvel Richards , MD CSN: [id number] Age: 65 Admit Type: Outpatient Procedure:                Colonoscopy with snare polypectomy Indications:              Screening for colorectal malignant neoplasm Providers:                Norvel Richards, MD, Jeanann Lewandowsky. Gwenlyn Perking RN, RN,                            Randa Spike, Technician Referring MD:              Medicines:                Midazolam 5 mg IV, Meperidine 75 mg IV, Ondansetron                            4 mg IV Complications:            No immediate complications. Estimated Blood Loss:     Estimated blood loss was minimal. Procedure:                Pre-Anesthesia Assessment:                           - Prior to the procedure, a History and Physical                            was performed, and patient medications and                            allergies were reviewed. The patient's tolerance of                            previous anesthesia was also reviewed. The risks                            and benefits of the procedure and the sedation                            options and risks were discussed with the patient.                            All questions were answered, and informed consent                            was obtained. Prior Anticoagulants: The patient has                            taken no previous anticoagulant or antiplatelet                            agents. ASA Grade Assessment: II - A patient with  mild systemic disease. After reviewing the risks                            and benefits, the patient was deemed in                            satisfactory condition to undergo the procedure.                           After obtaining informed consent, the colonoscope                            was passed under direct vision. Throughout the                  procedure, the patient's blood pressure, pulse, and                            oxygen saturations were monitored continuously. The                            EC-[id number] scope was introduced through                            the anus and advanced to the the cecum, identified                            by appendiceal orifice and ileocecal valve. The                            colonoscopy was performed without difficulty. The                            patient tolerated the procedure well. The quality                            of the bowel preparation was adequate. The                            ileocecal valve, appendiceal orifice, and rectum                            were photographed. Scope In: 12:34:09 PM Scope Out: 12:53:15 PM Scope Withdrawal Time: 0 hours 10 minutes 15 seconds  Total Procedure Duration: 0 hours 19 minutes 6 seconds  Findings:      The perianal and digital rectal examinations were normal.      A 4 mm polyp was found in the rectum. The polyp was semi-pedunculated.       The polyp was removed with a cold snare. Resection and retrieval were       complete. Estimated blood loss was minimal.      Scattered small and large-mouthed diverticula were found in the sigmoid       colon and descending colon.      The exam was otherwise without abnormality on direct and retroflexion       views.  Impression:               - One 4 mm polyp in the rectum, removed with a cold                            snare. Resected and retrieved.                           - Diverticulosis in the sigmoid colon and in the                            descending colon.                           - The examination was otherwise normal on direct                            and retroflexion views. Moderate Sedation:      Moderate (conscious) sedation was administered by the endoscopy nurse       and supervised by the endoscopist. The following parameters were       monitored:  oxygen saturation, heart rate, blood pressure, respiratory       rate, EKG, adequacy of pulmonary ventilation, and response to care.       Total physician intraservice time was 27 minutes. Recommendation:           - Patient has a contact number available for                            emergencies. The signs and symptoms of potential                            delayed complications were discussed with the                            patient. Return to normal activities tomorrow.                            Written discharge instructions were provided to the                            patient.                           - Resume previous diet.                           - Continue present medications.                           - Repeat colonoscopy date to be determined after                            pending pathology results are reviewed for                            surveillance based on pathology  results.                           - Return to GI office (date not yet determined). Procedure Code(s):        --- Professional ---                           [phone], Colonoscopy, flexible; with removal of                            tumor(s), polyp(s), or other lesion(s) by snare                            technique                           99152, Moderate sedation services provided by the                            same physician or other qualified health care                            professional performing the diagnostic or                            therapeutic service that the sedation supports,                            requiring the presence of an independent trained                            observer to assist in the monitoring of the                            patient's level of consciousness and physiological                            status; initial 15 minutes of intraservice time,                            patient age 32 years or older                           [phone], Moderate  sedation services; each additional                            15 minutes intraservice time Diagnosis Code(s):        --- Professional ---                           Z12.11, Encounter for screening for malignant                            neoplasm of colon  K62.1, Rectal polyp                           K57.30, Diverticulosis of large intestine without                            perforation or abscess without bleeding CPT copyright 2016 American Medical Association. All rights reserved. The codes documented in this report are preliminary and upon coder review may  be revised to meet current compliance requirements. Cristopher Estimable. Saffron Busey, MD Norvel Richards, MD 01/21/2016 1:02:38 PM This report has been signed electronically. Number of Addenda: 0

## 2016-01-23 ENCOUNTER — Encounter: Payer: Self-pay | Admitting: Internal Medicine

## 2016-01-28 ENCOUNTER — Encounter: Payer: Self-pay | Admitting: Family Medicine

## 2016-01-28 ENCOUNTER — Ambulatory Visit (INDEPENDENT_AMBULATORY_CARE_PROVIDER_SITE_OTHER): Payer: Managed Care, Other (non HMO) | Admitting: Family Medicine

## 2016-01-28 VITALS — BP 122/70 | Ht 72.0 in | Wt 248.4 lb

## 2016-01-28 DIAGNOSIS — I10 Essential (primary) hypertension: Secondary | ICD-10-CM

## 2016-01-28 DIAGNOSIS — G4733 Obstructive sleep apnea (adult) (pediatric): Secondary | ICD-10-CM

## 2016-01-28 DIAGNOSIS — Z6832 Body mass index (BMI) 32.0-32.9, adult: Secondary | ICD-10-CM | POA: Diagnosis not present

## 2016-01-28 DIAGNOSIS — E6609 Other obesity due to excess calories: Secondary | ICD-10-CM

## 2016-01-28 MED ORDER — ENALAPRIL MALEATE 10 MG PO TABS: ORAL_TABLET | ORAL | 1 refills | Status: DC

## 2016-01-28 NOTE — Progress Notes (Addendum)
   Subjective:    Patient ID: Fernando Riley, male    DOB: 19-Jun-1950, 65 y.o.   MRN: [id number]  Hypertension  This is a chronic problem. The current episode started more than 1 year ago. Risk factors for coronary artery disease include male gender. Treatments tried: vasotec. There are no compliance problems.    Results for orders placed or performed in visit on 11/16/15  Strep A DNA probe  Result Value Ref Range   Strep Gp A Direct, DNA Probe Negative Negative  POCT rapid strep A  Result Value Ref Range   Rapid Strep A Screen Negative Negative   126 OVER 73  SLIGHTLY elev when pt had colonoscopy  134 over 80   Exercise not as much  Twenty min per d three d    Patient realizes he has substantial challenges with obesity. Trying to lose weight. Has lost small amount. Not exercising as much as he had hoped.  Also 1 into have a discussion on vaccines. Has not had shingles vaccine. Had a mild case. Wonders if he is a candidate  Now staying on the machine , handlong well, weas at all times occa schallengw with the  Machine, using nasal prongs and handling well    Review of Systems No headache, no major weight loss or weight gain, no chest pain no back pain abdominal pain no change in bowel habits complete ROS otherwise negative     Objective:   Physical Exam Alert vitals stable, NAD. Blood pressure good on repeat. HEENT normal. Lungs clear. Heart regular rate and rhythm.        Assessment & Plan:  Impression 1 hypertension good control discussed maintain same dose meds #2 sleep apnea good control discussed maintain device #3 obesity discussed including long-term ramifications #4 immunization concerns discussed given prescription for shingles. plan medications refilled diet exercise discussed recheck in 6 months

## 2016-01-30 ENCOUNTER — Encounter (HOSPITAL_COMMUNITY): Payer: Self-pay | Admitting: Internal Medicine

## 2016-04-16 ENCOUNTER — Encounter: Payer: Self-pay | Admitting: Family Medicine

## 2016-04-16 ENCOUNTER — Ambulatory Visit (INDEPENDENT_AMBULATORY_CARE_PROVIDER_SITE_OTHER): Payer: Managed Care, Other (non HMO) | Admitting: Family Medicine

## 2016-04-16 VITALS — BP 130/80 | Temp 97.8°F | Ht 72.0 in | Wt 250.5 lb

## 2016-04-16 DIAGNOSIS — B309 Viral conjunctivitis, unspecified: Secondary | ICD-10-CM | POA: Diagnosis not present

## 2016-04-16 DIAGNOSIS — J019 Acute sinusitis, unspecified: Secondary | ICD-10-CM

## 2016-04-16 DIAGNOSIS — B9689 Other specified bacterial agents as the cause of diseases classified elsewhere: Secondary | ICD-10-CM

## 2016-04-16 DIAGNOSIS — B349 Viral infection, unspecified: Secondary | ICD-10-CM

## 2016-04-16 MED ORDER — AMOXICILLIN 500 MG PO TABS: 500.0000 mg | ORAL_TABLET | Freq: Three times a day (TID) | ORAL | 0 refills | Status: DC

## 2016-04-16 NOTE — Progress Notes (Signed)
   Subjective:    Patient ID: Fernando Riley, male    DOB: 04-13-1951, 66 y.o.   MRN: [id number]  URI   This is a new problem. The current episode started in the past 7 days. The problem has been unchanged. There has been no fever. Associated symptoms include coughing, headaches and a sore throat. Associated symptoms comments: Eye redness. Treatments tried: Nyquil, Emergen-C. The treatment provided no relief.   Patient has no other concerns at this time.    Review of Systems  HENT: Positive for sore throat.   Eyes: Positive for pain, discharge, redness and itching.  Respiratory: Positive for cough.   Neurological: Positive for headaches.       Objective:   Physical Exam  Constitutional: He appears well-developed.  HENT:  Head: Normocephalic.  Mouth/Throat: Oropharynx is clear and moist. No oropharyngeal exudate.  Neck: Normal range of motion.  Cardiovascular: Normal rate, regular rhythm and normal heart sounds.   No murmur heard. Pulmonary/Chest: Effort normal and breath sounds normal. He has no wheezes.  Lymphadenopathy:    He has no cervical adenopathy.  Neurological: He exhibits normal muscle tone.  Skin: Skin is warm and dry.  Nursing note and vitals reviewed.  Mild conjunctivitis of the right eye is noted. Patient would benefit from using allergy eyedrops should gradually get better       Assessment & Plan:  Patient was seen today for upper respiratory illness. It is felt that the patient is dealing with sinusitis. Antibiotics were prescribed today. Importance of compliance with medication was discussed. Symptoms should gradually resolve over the course of the next several days. If high fevers, progressive illness, difficulty breathing, worsening condition or failure for symptoms to improve over the next several days then the patient is to follow-up. If any emergent conditions the patient is to follow-up in the emergency department otherwise to follow-up in the office.

## 2016-04-29 ENCOUNTER — Telehealth: Payer: Self-pay | Admitting: Family Medicine

## 2016-04-29 MED ORDER — CEFUROXIME AXETIL 500 MG PO TABS: 500.0000 mg | ORAL_TABLET | Freq: Two times a day (BID) | ORAL | 0 refills | Status: DC

## 2016-04-29 NOTE — Telephone Encounter (Signed)
Patient seen Dr. Nicki Reaper on 04/16/16 for acute bacterial rhinosinusitis.  He was prescribed amoxicillin, but he is still having head congestion which is getting worse and feels like it is going down into his chest.  Please advise.  [facility] [address] [place], [place]

## 2016-04-29 NOTE — Telephone Encounter (Signed)
biaxin 500 bid ten d come let me know if does not work with red dye I do not know

## 2016-04-29 NOTE — Telephone Encounter (Signed)
Nurses spk with pt plz, rec switching to omnicef 300 bid ten d

## 2016-04-29 NOTE — Telephone Encounter (Signed)
Biaxin 500 mg contraindicated due to red dye allergy. Consult with Dr Richardson Landry: Ceftin 500 mg one tablet BID for 10 days. Prescription sent electronically to pharmacy. Patient notified and stated he is having cough and congestion and will follow up if no better.

## 2016-04-29 NOTE — Telephone Encounter (Signed)
Omnicef is contraindicated due to red dye allergy-please advise

## 2016-06-16 ENCOUNTER — Telehealth: Payer: Self-pay | Admitting: Family Medicine

## 2016-06-16 NOTE — Telephone Encounter (Signed)
Please see dermatopathology report in blue folder in office.  

## 2016-08-01 ENCOUNTER — Telehealth: Payer: Self-pay | Admitting: Family Medicine

## 2016-08-01 DIAGNOSIS — I10 Essential (primary) hypertension: Secondary | ICD-10-CM

## 2016-08-01 DIAGNOSIS — Z13220 Encounter for screening for lipoid disorders: Secondary | ICD-10-CM

## 2016-08-01 DIAGNOSIS — Z79899 Other long term (current) drug therapy: Secondary | ICD-10-CM

## 2016-08-01 DIAGNOSIS — Z125 Encounter for screening for malignant neoplasm of prostate: Secondary | ICD-10-CM

## 2016-08-01 NOTE — Telephone Encounter (Signed)
Patient wanting to get labs done today has physical on Monday and has been fasting also.

## 2016-08-01 NOTE — Telephone Encounter (Signed)
Lip/liv/met 7/psa 

## 2016-08-01 NOTE — Telephone Encounter (Signed)
Spoke with patient and informed him per Dr.Steve Luking- we have ordered labs orders. Patient verbalized understanding.

## 2016-08-02 LAB — HEPATIC FUNCTION PANEL
ALT: 16 IU/L (ref 0–44)
AST: 16 IU/L (ref 0–40)
Albumin: 4.3 g/dL (ref 3.6–4.8)
Alkaline Phosphatase: 86 IU/L (ref 39–117)
BILIRUBIN TOTAL: 0.7 mg/dL (ref 0.0–1.2)
BILIRUBIN, DIRECT: 0.22 mg/dL (ref 0.00–0.40)
Total Protein: 6.6 g/dL (ref 6.0–8.5)

## 2016-08-02 LAB — BASIC METABOLIC PANEL
BUN/Creatinine Ratio: 20 (ref 10–24)
BUN: 18 mg/dL (ref 8–27)
CALCIUM: 9.2 mg/dL (ref 8.6–10.2)
CO2: 24 mmol/L (ref 18–29)
CREATININE: 0.91 mg/dL (ref 0.76–1.27)
Chloride: 102 mmol/L (ref 96–106)
GFR, EST AFRICAN AMERICAN: 103 mL/min/{1.73_m2} (ref >59)
GFR, EST NON AFRICAN AMERICAN: 89 mL/min/{1.73_m2} (ref >59)
Glucose: 91 mg/dL (ref 65–99)
Potassium: 4.1 mmol/L (ref 3.5–5.2)
SODIUM: 142 mmol/L (ref 134–144)

## 2016-08-02 LAB — LIPID PANEL
CHOL/HDL RATIO: 3 ratio (ref 0.0–5.0)
Cholesterol, Total: 191 mg/dL (ref 100–199)
HDL: 64 mg/dL (ref >39)
LDL Calculated: 115 mg/dL — ABNORMAL HIGH (ref 0–99)
Triglycerides: 59 mg/dL (ref 0–149)
VLDL Cholesterol Cal: 12 mg/dL (ref 5–40)

## 2016-08-02 LAB — PSA: Prostate Specific Ag, Serum: 0.2 ng/mL (ref 0.0–4.0)

## 2016-08-04 ENCOUNTER — Ambulatory Visit (INDEPENDENT_AMBULATORY_CARE_PROVIDER_SITE_OTHER): Payer: Managed Care, Other (non HMO) | Admitting: Family Medicine

## 2016-08-04 ENCOUNTER — Encounter: Payer: Self-pay | Admitting: Family Medicine

## 2016-08-04 VITALS — BP 132/80 | Ht 71.5 in | Wt 254.0 lb

## 2016-08-04 DIAGNOSIS — Z0000 Encounter for general adult medical examination without abnormal findings: Secondary | ICD-10-CM | POA: Diagnosis not present

## 2016-08-04 DIAGNOSIS — I10 Essential (primary) hypertension: Secondary | ICD-10-CM | POA: Diagnosis not present

## 2016-08-04 DIAGNOSIS — G4733 Obstructive sleep apnea (adult) (pediatric): Secondary | ICD-10-CM | POA: Diagnosis not present

## 2016-08-04 MED ORDER — ENALAPRIL MALEATE 10 MG PO TABS: ORAL_TABLET | ORAL | 1 refills | Status: DC

## 2016-08-04 NOTE — Progress Notes (Signed)
Subjective:    Patient ID: Fernando Riley, male    DOB: 1950-04-25, 66 y.o.   MRN: [id number]  HPI The patient comes in today for a wellness visit.    A review of their health history was completed.  A review of medications was also completed.  Any needed refills; enalapril.   Eating habits: health conscious  Falls/  MVA accidents in past few months: none  Regular exercise: walking, yard and tree work, unload freight.   Specialist pt sees on regular basis: Dr. Ronnald Ramp - Dermatology, Dr. Royetta Crochet - vision, Dr. Delcie Roch - Dentist  Preventative health issues were discussed.   Additional concerns: none  Blood pressure medicine and blood pressure levels reviewed today with patient. Compliant with blood pressure medicine. States does not miss a dose. No obvious side effects. Blood pressure generally good when checked elsewhere. Watching salt intake.  BP udully in good range, 130 over 70 somethingn  Exercise so so, staying active with the [employer] work, and now  Results for orders placed or performed in visit on 08/01/16  Lipid panel  Result Value Ref Range   Cholesterol, Total 191 100 - 199 mg/dL   Triglycerides 59 0 - 149 mg/dL   HDL 64 >39 mg/dL   VLDL Cholesterol Cal 12 5 - 40 mg/dL   LDL Calculated 115 (H) 0 - 99 mg/dL   Chol/HDL Ratio 3.0 0.0 - 5.0 ratio  Hepatic function panel  Result Value Ref Range   Total Protein 6.6 6.0 - 8.5 g/dL   Albumin 4.3 3.6 - 4.8 g/dL   Bilirubin Total 0.7 0.0 - 1.2 mg/dL   Bilirubin, Direct 0.22 0.00 - 0.40 mg/dL   Alkaline Phosphatase 86 39 - 117 IU/L   AST 16 0 - 40 IU/L   ALT 16 0 - 44 IU/L  Basic metabolic panel  Result Value Ref Range   Glucose 91 65 - 99 mg/dL   BUN 18 8 - 27 mg/dL   Creatinine, Ser 0.91 0.76 - 1.27 mg/dL   GFR calc non Af Amer 89 >59 mL/min/1.73   GFR calc Af Amer 103 >59 mL/min/1.73   BUN/Creatinine Ratio 20 10 - 24   Sodium 142 134 - 144 mmol/L   Potassium 4.1 3.5 - 5.2 mmol/L   Chloride 102 96 - 106 mmol/L   CO2 24 18 - 29 mmol/L   Calcium 9.2 8.6 - 10.2 mg/dL  PSA  Result Value Ref Range   Prostate Specific Ag, Serum 0.2 0.0 - 4.0 ng/mL   Working harder on diet  Py got the shingles vaccine  Now a yrly flu shot person   Colon benign next due in ten yrs   sle apnea device, usu fithfully, still a chalenge to use   Review of Systems  Constitutional: Negative for activity change, appetite change and fever.  HENT: Negative for congestion and rhinorrhea.   Eyes: Negative for discharge.  Respiratory: Negative for cough and wheezing.   Cardiovascular: Negative for chest pain.  Gastrointestinal: Negative for abdominal pain, blood in stool and vomiting.  Genitourinary: Negative for difficulty urinating and frequency.  Musculoskeletal: Negative for neck pain.  Skin: Negative for rash.  Allergic/Immunologic: Negative for environmental allergies and food allergies.  Neurological: Negative for weakness and headaches.  Psychiatric/Behavioral: Negative for agitation.  All other systems reviewed and are negative.      Objective:   Physical Exam  Constitutional: He appears well-developed and well-nourished.  HENT:  Head: Normocephalic and atraumatic.  Right Ear:  External ear normal.  Left Ear: External ear normal.  Nose: Nose normal.  Mouth/Throat: Oropharynx is clear and moist.  Eyes: EOM are normal. Pupils are equal, round, and reactive to light.  Neck: Normal range of motion. Neck supple. No thyromegaly present.  Cardiovascular: Normal rate, regular rhythm and normal heart sounds.   No murmur heard. Pulmonary/Chest: Effort normal and breath sounds normal. No respiratory distress. He has no wheezes.  Abdominal: Soft. Bowel sounds are normal. He exhibits no distension and no mass. There is no tenderness.  Genitourinary: Penis normal.  Musculoskeletal: Normal range of motion. He exhibits no edema.  Lymphadenopathy:    He has no cervical adenopathy.  Neurological: He is alert. He  exhibits normal muscle tone.  Skin: Skin is warm and dry. No erythema.  Psychiatric: He has a normal mood and affect. His behavior is normal. Judgment normal.  Vitals reviewed.         Assessment & Plan:  Impression wellness exam up-to-date on colonoscopy. Diet and exercise discussed. Anticipatory guidance given. #2 hypertension good control discussed maintain same meds #3 sleep apnea good control C Pap discussed to maintain same weight loss and increasing exercise level encourage. Shingles vaccine prescription given. Follow-up in 6 months

## 2016-08-04 NOTE — Patient Instructions (Signed)
Results for orders placed or performed in visit on 08/01/16  Lipid panel  Result Value Ref Range   Cholesterol, Total 191 100 - 199 mg/dL   Triglycerides 59 0 - 149 mg/dL   HDL 64 >39 mg/dL   VLDL Cholesterol Cal 12 5 - 40 mg/dL   LDL Calculated 115 (H) 0 - 99 mg/dL   Chol/HDL Ratio 3.0 0.0 - 5.0 ratio  Hepatic function panel  Result Value Ref Range   Total Protein 6.6 6.0 - 8.5 g/dL   Albumin 4.3 3.6 - 4.8 g/dL   Bilirubin Total 0.7 0.0 - 1.2 mg/dL   Bilirubin, Direct 0.22 0.00 - 0.40 mg/dL   Alkaline Phosphatase 86 39 - 117 IU/L   AST 16 0 - 40 IU/L   ALT 16 0 - 44 IU/L  Basic metabolic panel  Result Value Ref Range   Glucose 91 65 - 99 mg/dL   BUN 18 8 - 27 mg/dL   Creatinine, Ser 0.91 0.76 - 1.27 mg/dL   GFR calc non Af Amer 89 >59 mL/min/1.73   GFR calc Af Amer 103 >59 mL/min/1.73   BUN/Creatinine Ratio 20 10 - 24   Sodium 142 134 - 144 mmol/L   Potassium 4.1 3.5 - 5.2 mmol/L   Chloride 102 96 - 106 mmol/L   CO2 24 18 - 29 mmol/L   Calcium 9.2 8.6 - 10.2 mg/dL  PSA  Result Value Ref Range   Prostate Specific Ag, Serum 0.2 0.0 - 4.0 ng/mL

## 2017-02-04 ENCOUNTER — Other Ambulatory Visit: Payer: Self-pay | Admitting: Family Medicine

## 2017-03-06 ENCOUNTER — Other Ambulatory Visit: Payer: Self-pay | Admitting: Family Medicine

## 2017-03-13 ENCOUNTER — Encounter: Payer: Self-pay | Admitting: Family Medicine

## 2017-03-13 ENCOUNTER — Ambulatory Visit: Payer: Managed Care, Other (non HMO) | Admitting: Family Medicine

## 2017-03-13 VITALS — BP 124/76 | Ht 71.5 in | Wt 268.0 lb

## 2017-03-13 DIAGNOSIS — I10 Essential (primary) hypertension: Secondary | ICD-10-CM | POA: Diagnosis not present

## 2017-03-13 MED ORDER — ENALAPRIL MALEATE 10 MG PO TABS: 10.0000 mg | ORAL_TABLET | Freq: Every day | ORAL | 5 refills | Status: DC

## 2017-03-13 NOTE — Progress Notes (Signed)
   Subjective:    Patient ID: Fernando Riley, male    DOB: April 01, 1951, 66 y.o.   MRN: [id number]  HPI  Patient is here today to follow up on HTN. He is currently taking Enalapril 10 mg one po daily. He states he eats healthy and does not get much exercise, due to injury his knee in July.No other concerns.   Had knee injury and shoulder injury   Rot cuff erepair with kneed for repair   totl knee on right and maybe on left  Blood pressure medicine and blood pressure levels reviewed today with patient. Compliant with blood pressure medicine. States does not miss a dose. No obvious side effects. Blood pressure generally good when checked elsewhere. Watching salt intake.   BP overall gpood, a bit elev yesterday  140 over 79 yesterday   cpap definitely helps and benerficial. Uses faithfully, Energy dimished when not using it    Flu shot already  Review of Systems No headache, no major weight loss or weight gain, no chest pain no back pain abdominal pain no change in bowel habits complete ROS otherwise negative     Objective:   Physical Exam Alert vitals stable, NAD. Blood pressure good on repeat. HEENT normal. Lungs clear. Heart regular rate and rhythm. Impression hypertension good control discussed to maintain same       Assessment & Plan:

## 2017-05-07 DIAGNOSIS — Z029 Encounter for administrative examinations, unspecified: Secondary | ICD-10-CM

## 2017-05-08 DIAGNOSIS — Z0289 Encounter for other administrative examinations: Secondary | ICD-10-CM

## 2017-06-11 ENCOUNTER — Telehealth: Payer: Self-pay | Admitting: Family Medicine

## 2017-06-11 NOTE — Telephone Encounter (Signed)
Please see dermatopathology report in blue folder in office.

## 2017-07-01 ENCOUNTER — Encounter: Payer: Self-pay | Admitting: Family Medicine

## 2017-08-24 ENCOUNTER — Other Ambulatory Visit: Payer: Self-pay | Admitting: Family Medicine

## 2017-08-26 ENCOUNTER — Ambulatory Visit (INDEPENDENT_AMBULATORY_CARE_PROVIDER_SITE_OTHER): Payer: Worker's Compensation

## 2017-08-26 ENCOUNTER — Ambulatory Visit (INDEPENDENT_AMBULATORY_CARE_PROVIDER_SITE_OTHER): Payer: Worker's Compensation | Admitting: Orthopedic Surgery

## 2017-08-26 ENCOUNTER — Ambulatory Visit (INDEPENDENT_AMBULATORY_CARE_PROVIDER_SITE_OTHER): Payer: Managed Care, Other (non HMO) | Admitting: Orthopedic Surgery

## 2017-08-26 ENCOUNTER — Encounter (INDEPENDENT_AMBULATORY_CARE_PROVIDER_SITE_OTHER): Payer: Self-pay

## 2017-08-26 ENCOUNTER — Encounter (INDEPENDENT_AMBULATORY_CARE_PROVIDER_SITE_OTHER): Payer: Self-pay | Admitting: Orthopedic Surgery

## 2017-08-26 DIAGNOSIS — M25512 Pain in left shoulder: Secondary | ICD-10-CM

## 2017-08-26 DIAGNOSIS — M545 Low back pain: Secondary | ICD-10-CM

## 2017-08-26 DIAGNOSIS — M25552 Pain in left hip: Secondary | ICD-10-CM

## 2017-08-26 DIAGNOSIS — M25562 Pain in left knee: Secondary | ICD-10-CM

## 2017-08-26 DIAGNOSIS — M172 Bilateral post-traumatic osteoarthritis of knee: Secondary | ICD-10-CM

## 2017-08-26 MED ORDER — LIDOCAINE HCL 1 % IJ SOLN
5.0000 mL | INTRAMUSCULAR | Status: AC | PRN
  Administered 2017-08-26: 5 mL

## 2017-08-26 MED ORDER — TRIAMCINOLONE ACETONIDE 40 MG/ML IJ SUSP
40.0000 mg | INTRAMUSCULAR | Status: AC | PRN
  Administered 2017-08-26: 40 mg via INTRA_ARTICULAR

## 2017-08-26 MED ORDER — BUPIVACAINE HCL 0.25 % IJ SOLN
4.0000 mL | INTRAMUSCULAR | Status: AC | PRN
  Administered 2017-08-26: 4 mL via INTRA_ARTICULAR

## 2017-08-26 NOTE — Progress Notes (Signed)
Office Visit Note   Patient: Fernando Riley           Date of Birth: 1950-10-20           MRN: [id number] Visit Date: 08/26/2017 Requested by: Mikey Kirschner, [address] [place], [place] [id number] PCP: Mikey Kirschner, MD  Subjective: Chief Complaint  Patient presents with  . Left Shoulder - Pain, Injury  . Left Knee - Pain, Injury  . Lower Back - Pain, Injury  . Left Hip - Pain, Injury    HPI: Fernando Riley is a patient with left shoulder and left knee pain along with some hip and back pain.  Date of injury 08/24/2017.  Boxes were falling off of a pallet jack and he was trying to move out of the way when the boxes hit the back of his legs and caused him to fall landing on the left-hand side.  He is been taking ibuprofen and using ice.  He recently had left shoulder surgery which is reviewed.  He also has had right knee MRI scan done approximately a year ago which showed ACL tear and patellofemoral and medial compartment arthritis.  Left shoulder MRI prior to his surgery in October showed partial thickness infraspinatus tear and some early degenerative changes of the glenohumeral joint.  Since the fall he describes new popping and clicking in the left shoulder with what he describes as essentially a positive drop arm test.  It is hard for him to sleep on the left-hand side.  He denies any numbness and tingling in that left arm.  He also reports left and right knee pain since the accident.  He describes weakness and giving way in both knees.  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  He definitely had initial pain at the time of injury and the pain worsened the next day.  Describes pain getting in and out of bed and and out of chairs primarily relating to his knees both left and right.  He did not work yesterday.  The fall on the left shoulder was more of a direct impact injury and not a fall on outstretched arm.              ROS: All systems reviewed are  negative as they relate to the chief complaint within the history of present illness.  Patient denies  fevers or chills.   Assessment & Plan: Visit Diagnoses:  1. Left shoulder pain, unspecified chronicity   2. Left knee pain, unspecified chronicity   3. Low back pain, unspecified back pain laterality, unspecified chronicity, with sciatica presence unspecified   4. Pain in left hip   5. 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  I think the patient also may have completed the partial thickness tearing his rotator cuff.  Both knees were aspirated and injected today.  Patient needs left shoulder MRI scan to evaluate for infraspinatus tear.  MRI arthrogram would be ideal to look for small tear.  I think the knees are something that we will have to see if they can get better on their own.  The injections will be a good reset button for them.  The shoulder on a little bit more concerned about based on his history.  What he describes the new popping and catching as well as when he  moves from forward flexion to his arm back at his side he is describing essentially a positive drop arm test with the pain and weakness that he has.  Follow-Up Instructions: Return for after MRI.   Orders:  Orders Placed This Encounter  Procedures  . XR Shoulder Left  . XR KNEE 3 VIEW LEFT  . XR Lumbar Spine 2-3 Views  . XR HIP UNILAT W OR W/O PELVIS 2-3 VIEWS LEFT  . MR Shoulder Left w/ contrast  . Arthrogram   No orders of the defined types were placed in this encounter.     Procedures: Large Joint Inj: bilateral knee on 08/26/2017 2:04 PM Indications: diagnostic evaluation, joint swelling and pain Details: 18 G 1.5 in needle, superolateral approach  Arthrogram: No  Medications (Right): 5 mL lidocaine 1 %; 40 mg triamcinolone acetonide 40 MG/ML; 4 mL bupivacaine 0.25 % Medications (Left): 5 mL  lidocaine 1 %; 40 mg triamcinolone acetonide 40 MG/ML; 4 mL bupivacaine 0.25 % Outcome: tolerated well, no immediate complications Procedure, treatment alternatives, risks and benefits explained, specific risks discussed. Consent was given by the patient. Immediately prior to procedure a time out was called to verify the correct patient, procedure, equipment, support staff and site/side marked as required. Patient was prepped and draped in the usual sterile fashion.       Clinical Data: No additional findings.  Objective: Vital Signs: There were no vitals taken for this visit.  Physical Exam:   Constitutional: Patient appears well-developed HEENT:  Head: Normocephalic Eyes:EOM are normal Neck: Normal range of motion Cardiovascular: Normal rate Pulmonary/chest: Effort normal Neurologic: Patient is alert Skin: Skin is warm Psychiatric: Patient has normal mood and affect    Ortho Exam: Orthopedic exam demonstrates slightly antalgic gait to the left but with palpable pedal pulses.  He does have mild effusion in both knees.  ACL a little looser on the right compared to the left.  Patellofemoral crepitus is present bilaterally.  Collateral and cruciate stable on the left.  No groin pain with internal/external rotation of either hip.  Pedal pulses palpable.  Left shoulder demonstrates pretty reasonable passive range of motion but does have some weakness to infraspinatus testing.  No discrete AC joint tenderness on the left or right hand side.  AC joint more prominent on the right than the left.  I do not detect a lot of coarse grinding or crepitus with internal/external rotation on that left shoulder at 90 degrees of abduction but he does have some weakness on exam.  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  Specialty Comments:  No specialty comments available.  Imaging: Xr Hip Unilat W Or W/o Pelvis 2-3 Views Left  Result Date: 08/26/2017 AP pelvis lateral left hip reviewed.  No fracture  or dislocation present.  No significant degenerative joint changes noted in the hip joints.  Rami intact.  Xr Knee 3 View Left  Result Date: 08/26/2017 AP lateral merchant left knee reviewed.  Medial greater than lateral compartment arthritis is present with joint space narrowing noted.  There is also patellofemoral joint space narrowing.  No fracture or dislocation.  Mild varus alignment present.  Xr Lumbar Spine 2-3 Views  Result Date: 08/26/2017 AP lateral lumbar spine reviewed.  No spondylolisthesis or compression fractures present.  Mild degenerative changes noted.  No SI joint abnormalities.  Visualized hips intact.  Xr Shoulder Left  Result Date: 08/26/2017 AP lateral outlet left shoulder reviewed.  Prior distal clavicle excision has been performed.  No new fracture or  dislocation.  Visualized lung fields clear.    PMFS History: Patient Active Problem List   Diagnosis Date Noted  . Viral syndrome 11/16/2015  . Obstructive sleep apnea 08/20/2015  . Essential hypertension, benign 07/20/2013   Past Medical History:  Diagnosis Date  . Cat allergies   . Fatty liver   . Hypertension   . Impaired fasting glucose   . Kidney stone   . Pollen allergies   . Reactive airways dysfunction syndrome ([place])   . Venous stasis     Family History  Problem Relation Age of Onset  . Heart disease Father   . Diabetes Brother   . Hyperlipidemia Brother     Past Surgical History:  Procedure Laterality Date  . COLONOSCOPY    . COLONOSCOPY N/A 01/21/2016   Procedure: COLONOSCOPY;  Surgeon: Daneil Dolin, MD;  [facility];  Service: Endoscopy;  Laterality: N/A;  1:00 PM - moved to 12:30 - office notified per Tretha Sciara  . KNEE ARTHROSCOPY     x2  . KNEE ARTHROSCOPY    . POLYPECTOMY  01/21/2016   Procedure: POLYPECTOMY;  Surgeon: Daneil Dolin, MD;  [facility];  Service: Endoscopy;;  colon  . SHOULDER SURGERY Right 2016   Social History   Occupational History  .  Not on file  Tobacco Use  . Smoking status: Never Smoker  . Smokeless tobacco: Never Used  Substance and Sexual Activity  . Alcohol use: No  . Drug use: No  . Sexual activity: Not on file

## 2017-08-27 ENCOUNTER — Telehealth (INDEPENDENT_AMBULATORY_CARE_PROVIDER_SITE_OTHER): Payer: Self-pay

## 2017-08-27 NOTE — Telephone Encounter (Signed)
Faxed the 08/26/17 office note, work note, and MRI order to case mgr per her request [phone]

## 2017-08-31 ENCOUNTER — Telehealth (INDEPENDENT_AMBULATORY_CARE_PROVIDER_SITE_OTHER): Payer: Self-pay | Admitting: Orthopedic Surgery

## 2017-09-01 ENCOUNTER — Ambulatory Visit (INDEPENDENT_AMBULATORY_CARE_PROVIDER_SITE_OTHER): Payer: Managed Care, Other (non HMO) | Admitting: Orthopaedic Surgery

## 2017-09-01 ENCOUNTER — Encounter (INDEPENDENT_AMBULATORY_CARE_PROVIDER_SITE_OTHER): Payer: Self-pay

## 2017-09-04 ENCOUNTER — Telehealth (INDEPENDENT_AMBULATORY_CARE_PROVIDER_SITE_OTHER): Payer: Self-pay | Admitting: Physician Assistant

## 2017-09-04 ENCOUNTER — Ambulatory Visit (INDEPENDENT_AMBULATORY_CARE_PROVIDER_SITE_OTHER): Payer: Worker's Compensation | Admitting: Orthopedic Surgery

## 2017-09-04 DIAGNOSIS — M7552 Bursitis of left shoulder: Secondary | ICD-10-CM | POA: Diagnosis not present

## 2017-09-04 DIAGNOSIS — M25512 Pain in left shoulder: Secondary | ICD-10-CM | POA: Diagnosis not present

## 2017-09-04 NOTE — Telephone Encounter (Signed)
Error

## 2017-09-05 ENCOUNTER — Encounter (INDEPENDENT_AMBULATORY_CARE_PROVIDER_SITE_OTHER): Payer: Self-pay | Admitting: Orthopedic Surgery

## 2017-09-05 DIAGNOSIS — M7552 Bursitis of left shoulder: Secondary | ICD-10-CM | POA: Diagnosis not present

## 2017-09-05 MED ORDER — METHYLPREDNISOLONE ACETATE 40 MG/ML IJ SUSP
40.0000 mg | INTRAMUSCULAR | Status: AC | PRN
  Administered 2017-09-05: 40 mg via INTRA_ARTICULAR

## 2017-09-05 MED ORDER — LIDOCAINE HCL 1 % IJ SOLN
5.0000 mL | INTRAMUSCULAR | Status: AC | PRN
Start: 2017-09-05 — End: 2017-09-05
  Administered 2017-09-05: 5 mL

## 2017-09-05 MED ORDER — BUPIVACAINE HCL 0.5 % IJ SOLN
9.0000 mL | INTRAMUSCULAR | Status: AC | PRN
Start: 2017-09-05 — End: 2017-09-05
  Administered 2017-09-05: 9 mL via INTRA_ARTICULAR

## 2017-09-05 NOTE — Progress Notes (Addendum)
Office Visit Note   Patient: Fernando Riley           Date of Birth: [birth year]           MRN: [id number] Visit Date: 09/04/2017 Requested by: Mikey Kirschner, [address] [place], [place] [address] PCP: Mikey Kirschner, MD  Subjective: Chief Complaint  Patient presents with  . Left Shoulder - Follow-up    S/p MR Arthrogram    HPI: Fernando Riley is a patient with left shoulder pain and bilateral knee pain following injury 08/24/2017.  Last clinic visit both knees were injected and he did get some relief from that but the pain is started to recur particularly on the left.  Since I have seen him he has had an MRI scan of that left shoulder to evaluate for possible rotator cuff tear.  That scan is reviewed with the patient and it does show degenerative circumferential labral tearing with no humeral head bruising indicating instability of the shoulder.  There is a rotator interval defect which appears to be postsurgical.  There is no full-thickness rotator cuff tear.  There has been biceps release done of the intra-articular portion of the biceps tendon.  Moderate degenerative changes also present.  Veto describes pain with abduction and he feels like there is a catch or hitch in the shoulder with the transition from overhead movement to bring his arm down by his side.  He is not taking any current pain medications.  He has been out of work.              ROS: All systems reviewed are negative as they relate to the chief complaint within the history of present illness.  Patient denies  fevers or chills.   Assessment & Plan: Visit Diagnoses:  1. Left shoulder pain, unspecified chronicity     Plan: Impression is left shoulder pain with a hitch moving from abduction to his arm at the side.  He is having some symptoms with overhead motion.  The rotator interval defect appears to be postsurgical.  No new definite rotator cuff tear.  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  Most of his symptoms however are localizing to the deltoid area and not around the biceps tendon.  Plan today is for subacromial injection and initiation of physical therapy.  I do not see a definite operative problem in that left shoulder at this time.  We will see how he does with physical therapy.  I do think he will need to be out of work for the next month until the shoulder recovers.  His left knee also remains aggravated from the fall despite the mild improvement he received from the cortisone injection.  Paperwork filled out today to continue out of work for the next 4 weeks.  The patient does desire to return to work as soon as possible.  Follow-Up Instructions: Return in about 1 month (around 10/02/2017).   Orders:  No orders of the defined types were placed in this encounter.  No orders of the defined types were placed in this encounter.     Procedures: Large Joint Inj: L subacromial bursa on 09/05/2017 9:03 AM Indications: diagnostic evaluation and pain Details: 18 G 1.5 in needle, posterior approach  Arthrogram: No  Medications: 9 mL bupivacaine 0.5 %; 40 mg methylPREDNISolone acetate 40 MG/ML; 5 mL lidocaine 1 % Outcome: tolerated well,  no immediate complications Procedure, treatment alternatives, risks and benefits explained, specific risks discussed. Consent was given by the patient. Immediately prior to procedure a time out was called to verify the correct patient, procedure, equipment, support staff and site/side marked as required. Patient was prepped and draped in the usual sterile fashion.       Clinical Data: No additional findings.  Objective: Vital Signs: There were no vitals taken for this visit.  Physical Exam:   Constitutional: Patient appears well-developed HEENT:  Head: Normocephalic Eyes:EOM are normal Neck: Normal range of motion Cardiovascular: Normal rate Pulmonary/chest:  Effort normal Neurologic: Patient is alert Skin: Skin is warm Psychiatric: Patient has normal mood and affect    Ortho Exam: Orthopedic exam demonstrates good cervical spine range of motion.  Patient has pretty reasonable cuff strength on the left but does have a pain and tenderness when moving from about 100 degrees of abduction to 80 degrees of abduction.  No discrete AC joint tenderness is noted.  No tenderness to palpation of the biceps tendon on the left-hand side.  No restriction of external rotation left versus right.  Both knees are examined and there is only trace effusion in the left knee no effusion in the right.  Some medial joint line tenderness is present.  Specialty Comments:  No specialty comments available.  Imaging: No results found.   PMFS History: Patient Active Problem List   Diagnosis Date Noted  . Viral syndrome 11/16/2015  . Obstructive sleep apnea 08/20/2015  . Essential hypertension, benign 07/20/2013   Past Medical History:  Diagnosis Date  . Cat allergies   . Fatty liver   . Hypertension   . Impaired fasting glucose   . Kidney stone   . Pollen allergies   . Reactive airways dysfunction syndrome ([place])   . Venous stasis     Family History  Problem Relation Age of Onset  . Heart disease Father   . Diabetes Brother   . Hyperlipidemia Brother     Past Surgical History:  Procedure Laterality Date  . COLONOSCOPY    . COLONOSCOPY N/A 01/21/2016   Procedure: COLONOSCOPY;  Surgeon: Daneil Dolin, MD;  [facility];  Service: Endoscopy;  Laterality: N/A;  1:00 PM - moved to 12:30 - office notified per Tretha Sciara  . KNEE ARTHROSCOPY     x2  . KNEE ARTHROSCOPY    . POLYPECTOMY  01/21/2016   Procedure: POLYPECTOMY;  Surgeon: Daneil Dolin, MD;  [facility];  Service: Endoscopy;;  colon  . SHOULDER SURGERY Right 2016   Social History   Occupational History  . Not on file  Tobacco Use  . Smoking status: Never Smoker  .  Smokeless tobacco: Never Used  Substance and Sexual Activity  . Alcohol use: No  . Drug use: No  . Sexual activity: Not on file

## 2017-09-08 ENCOUNTER — Telehealth (INDEPENDENT_AMBULATORY_CARE_PROVIDER_SITE_OTHER): Payer: Self-pay

## 2017-09-08 NOTE — Telephone Encounter (Signed)
Faxed the 09/04/17 work note, office note, and PT to case mgr per her request [phone]

## 2017-09-10 ENCOUNTER — Telehealth (INDEPENDENT_AMBULATORY_CARE_PROVIDER_SITE_OTHER): Payer: Self-pay

## 2017-09-10 ENCOUNTER — Ambulatory Visit: Payer: Managed Care, Other (non HMO) | Admitting: Family Medicine

## 2017-09-10 NOTE — Telephone Encounter (Signed)
[employer] called to ask for another phone # for pt. Calling home # and unable to get pt. Gave pts cell #.

## 2017-09-11 ENCOUNTER — Telehealth (INDEPENDENT_AMBULATORY_CARE_PROVIDER_SITE_OTHER): Payer: Self-pay | Admitting: Orthopedic Surgery

## 2017-09-11 ENCOUNTER — Telehealth (INDEPENDENT_AMBULATORY_CARE_PROVIDER_SITE_OTHER): Payer: Self-pay

## 2017-09-11 NOTE — Telephone Encounter (Signed)
Fernando Riley(called no business name given)called needed verification  On PT orders for this patient.  States request for PT Bilateral knees & left shoulder Wants to tal with someone as its not clear what the actual order is.  PTEL#[id number]  [phone]

## 2017-09-11 NOTE — Telephone Encounter (Signed)
IC back (the company name is [employer]), Bonnita Nasuti was not available, spoke with different representative Jose who stated they just needed PT order. I faxed to the [phone]

## 2017-09-11 NOTE — Telephone Encounter (Signed)
Faxed the PT rx to PT facility per their request. Pt is scheduled to start on 09/17/17.

## 2017-09-14 ENCOUNTER — Ambulatory Visit: Payer: Managed Care, Other (non HMO) | Admitting: Family Medicine

## 2017-09-14 ENCOUNTER — Encounter: Payer: Self-pay | Admitting: Family Medicine

## 2017-09-14 VITALS — BP 148/90 | Ht 71.5 in | Wt 255.2 lb

## 2017-09-14 DIAGNOSIS — Z23 Encounter for immunization: Secondary | ICD-10-CM | POA: Diagnosis not present

## 2017-09-14 DIAGNOSIS — Z0000 Encounter for general adult medical examination without abnormal findings: Secondary | ICD-10-CM

## 2017-09-14 DIAGNOSIS — Z0001 Encounter for general adult medical examination with abnormal findings: Secondary | ICD-10-CM | POA: Diagnosis not present

## 2017-09-14 DIAGNOSIS — I10 Essential (primary) hypertension: Secondary | ICD-10-CM | POA: Diagnosis not present

## 2017-09-14 DIAGNOSIS — N486 Induration penis plastica: Secondary | ICD-10-CM | POA: Diagnosis not present

## 2017-09-14 DIAGNOSIS — G4733 Obstructive sleep apnea (adult) (pediatric): Secondary | ICD-10-CM

## 2017-09-14 DIAGNOSIS — Z79899 Other long term (current) drug therapy: Secondary | ICD-10-CM | POA: Diagnosis not present

## 2017-09-14 DIAGNOSIS — Z13220 Encounter for screening for lipoid disorders: Secondary | ICD-10-CM | POA: Diagnosis not present

## 2017-09-14 DIAGNOSIS — Z125 Encounter for screening for malignant neoplasm of prostate: Secondary | ICD-10-CM | POA: Diagnosis not present

## 2017-09-14 MED ORDER — ENALAPRIL MALEATE 10 MG PO TABS: 10.0000 mg | ORAL_TABLET | Freq: Every day | ORAL | 5 refills | Status: DC
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
## 2017-09-17 ENCOUNTER — Telehealth (INDEPENDENT_AMBULATORY_CARE_PROVIDER_SITE_OTHER): Payer: Self-pay

## 2017-09-17 NOTE — Telephone Encounter (Signed)
FYI-Case mgr called to give her info as she is now assigned to pt. She can't make pts 10/09/17 appt so she asked that it be rescheduled to 10/08/17. She will call pt and adv of the change and call me back if he can't make this work.

## 2017-09-22 NOTE — Telephone Encounter (Signed)
Langley Gauss, [facility] Case Manager with [facility] called to cancel patients appt for 6/27 due to insurance company changing patients care over to [employer].  If you need to call her back # [phone]

## 2017-09-23 LAB — BASIC METABOLIC PANEL
BUN / CREAT RATIO: 20 (ref 10–24)
BUN: 20 mg/dL (ref 8–27)
CO2: 26 mmol/L (ref 20–29)
CREATININE: 0.98 mg/dL (ref 0.76–1.27)
Calcium: 9.2 mg/dL (ref 8.6–10.2)
Chloride: 107 mmol/L — ABNORMAL HIGH (ref 96–106)
GFR calc non Af Amer: 81 mL/min/{1.73_m2} (ref >59)
GFR, EST AFRICAN AMERICAN: 93 mL/min/{1.73_m2} (ref >59)
GLUCOSE: 88 mg/dL (ref 65–99)
Potassium: 4.5 mmol/L (ref 3.5–5.2)
Sodium: 145 mmol/L — ABNORMAL HIGH (ref 134–144)

## 2017-09-23 LAB — CBC WITH DIFFERENTIAL/PLATELET
BASOS ABS: 0 10*3/uL (ref 0.0–0.2)
Basos: 1 %
EOS (ABSOLUTE): 0.4 10*3/uL (ref 0.0–0.4)
Eos: 6 %
HEMOGLOBIN: 14.1 g/dL (ref 13.0–17.7)
Hematocrit: 42.6 % (ref 37.5–51.0)
IMMATURE GRANS (ABS): 0 10*3/uL (ref 0.0–0.1)
Immature Granulocytes: 0 %
LYMPHS: 38 %
Lymphocytes Absolute: 2.7 10*3/uL (ref 0.7–3.1)
MCH: 31.2 pg (ref 26.6–33.0)
MCHC: 33.1 g/dL (ref 31.5–35.7)
MCV: 94 fL (ref 79–97)
MONOCYTES: 10 %
Monocytes Absolute: 0.7 10*3/uL (ref 0.1–0.9)
NEUTROS ABS: 3.2 10*3/uL (ref 1.4–7.0)
Neutrophils: 45 %
Platelets: 230 10*3/uL (ref 150–450)
RBC: 4.52 x10E6/uL (ref 4.14–5.80)
RDW: 13.3 % (ref 12.3–15.4)
WBC: 7.1 10*3/uL (ref 3.4–10.8)

## 2017-09-23 LAB — HEPATIC FUNCTION PANEL
ALBUMIN: 4 g/dL (ref 3.6–4.8)
ALT: 14 IU/L (ref 0–44)
AST: 16 IU/L (ref 0–40)
Alkaline Phosphatase: 86 IU/L (ref 39–117)
BILIRUBIN TOTAL: 0.6 mg/dL (ref 0.0–1.2)
BILIRUBIN, DIRECT: 0.2 mg/dL (ref 0.00–0.40)
TOTAL PROTEIN: 6.3 g/dL (ref 6.0–8.5)

## 2017-09-23 LAB — LIPID PANEL
CHOL/HDL RATIO: 2.6 ratio (ref 0.0–5.0)
Cholesterol, Total: 171 mg/dL (ref 100–199)
HDL: 65 mg/dL (ref >39)
LDL Calculated: 95 mg/dL (ref 0–99)
Triglycerides: 55 mg/dL (ref 0–149)
VLDL Cholesterol Cal: 11 mg/dL (ref 5–40)

## 2017-09-23 LAB — PSA: PROSTATE SPECIFIC AG, SERUM: 0.3 ng/mL (ref 0.0–4.0)

## 2017-09-28 ENCOUNTER — Encounter: Payer: Self-pay | Admitting: Family Medicine

## 2017-10-08 ENCOUNTER — Ambulatory Visit (INDEPENDENT_AMBULATORY_CARE_PROVIDER_SITE_OTHER): Payer: Self-pay | Admitting: Orthopedic Surgery

## 2017-10-09 ENCOUNTER — Ambulatory Visit (INDEPENDENT_AMBULATORY_CARE_PROVIDER_SITE_OTHER): Payer: Self-pay | Admitting: Orthopedic Surgery

## 2017-10-13 ENCOUNTER — Encounter (INDEPENDENT_AMBULATORY_CARE_PROVIDER_SITE_OTHER): Payer: Self-pay

## 2017-10-13 ENCOUNTER — Telehealth: Payer: Self-pay | Admitting: Family Medicine

## 2017-10-13 ENCOUNTER — Other Ambulatory Visit: Payer: Self-pay | Admitting: Family Medicine

## 2017-10-13 ENCOUNTER — Encounter: Payer: Self-pay | Admitting: Family Medicine

## 2017-10-13 DIAGNOSIS — N486 Induration penis plastica: Secondary | ICD-10-CM

## 2017-10-13 NOTE — Telephone Encounter (Signed)
Referral placed.

## 2017-10-13 NOTE — Telephone Encounter (Signed)
Pt called to check status of referral to Dr. Diona Fanti (urology)  Pt was seen 09/14/17 & referral is mentioned in [place] note but there is not an actual referral in system  Please advise & initiate referral in system so that I may process

## 2017-10-13 NOTE — Telephone Encounter (Signed)
Let's do 

## 2017-10-16 NOTE — Telephone Encounter (Signed)
Faxed referral to [facility], they'll contact pt to schedule

## 2017-10-26 ENCOUNTER — Telehealth (INDEPENDENT_AMBULATORY_CARE_PROVIDER_SITE_OTHER): Payer: Self-pay

## 2017-10-26 NOTE — Telephone Encounter (Signed)
Received fax request from case mgr for the 10/07/17 office note. We have not seen this pt since May as we were advised his care got transferred. Faxed back the request with this info on it.

## 2017-12-15 ENCOUNTER — Ambulatory Visit: Payer: Managed Care, Other (non HMO) | Admitting: Urology

## 2017-12-15 DIAGNOSIS — N486 Induration penis plastica: Secondary | ICD-10-CM

## 2017-12-15 DIAGNOSIS — Z87442 Personal history of urinary calculi: Secondary | ICD-10-CM | POA: Diagnosis not present

## 2018-03-10 ENCOUNTER — Telehealth: Payer: Managed Care, Other (non HMO) | Admitting: Family

## 2018-03-10 DIAGNOSIS — J069 Acute upper respiratory infection, unspecified: Secondary | ICD-10-CM

## 2018-03-10 DIAGNOSIS — B9789 Other viral agents as the cause of diseases classified elsewhere: Secondary | ICD-10-CM

## 2018-03-10 MED ORDER — BENZONATATE 100 MG PO CAPS: 100.0000 mg | ORAL_CAPSULE | Freq: Three times a day (TID) | ORAL | 0 refills | Status: DC | PRN

## 2018-03-10 MED ORDER — FLUTICASONE PROPIONATE 50 MCG/ACT NA SUSP: 2.0000 | Freq: Every day | NASAL | 6 refills | Status: DC

## 2018-03-10 NOTE — Progress Notes (Signed)
We are sorry you are not feeling well.  Here is how we plan to help!  Based on what you have shared with me, it looks like you may have a viral upper respiratory infection or a "common cold".  Colds are caused by a large number of viruses; however, rhinovirus is the most common cause.   Symptoms of the common cold vary from person to person, with common symptoms including sore throat, cough, and malaise.  A low-grade fever of 100.4 may present, but is often uncommon.  Symptoms vary however, and are closely related to a person's age or underlying illnesses.  The most common symptoms associated with the common cold are nasal discharge or congestion, cough, sneezing, headache and pressure in the ears and face.  Cold symptoms usually persist for about 3 to 10 days, but can last up to 2 weeks.  It is important to know that colds do not cause serious illness or complications in most cases.    The common cold is transmitted from person to person, with the most common method of transmission being a person's hands.  The virus is able to live on the skin and can infect other persons for up to 2 hours after direct contact.  Also, colds are transmitted when someone coughs or sneezes; thus, it is important to cover the mouth to reduce this risk.  To keep the spread of the common cold at [place], good hand hygiene is very important.  This is an infection that is most likely caused by a virus. There are no specific treatments for the common cold other than to help you with the symptoms until the infection runs its course.    For nasal congestion, you may use an oral decongestants such as Mucinex D or if you have glaucoma or high blood pressure use plain Mucinex.  Saline nasal spray or nasal drops can help and can safely be used as often as needed for congestion.  For your congestion, I have prescribed Fluticasone nasal spray one spray in each nostril twice a day  If you do not have a history of heart disease, hypertension,  diabetes or thyroid disease, prostate/bladder issues or glaucoma, you may also use Sudafed to treat nasal congestion.  It is highly recommended that you consult with a pharmacist or your primary care physician to ensure this medication is safe for you to take.     If you have a cough, you may use cough suppressants such as Delsym and Robitussin.  If you have glaucoma or high blood pressure, you can also use Coricidin HBP.   For cough I have prescribed for you A prescription cough medication called Tessalon Perles 100 mg. You may take 1-2 capsules every 8 hours as needed for cough  If you have a sore or scratchy throat, use a saltwater gargle-  to  teaspoon of salt dissolved in a 4-ounce to 8-ounce glass of warm water.  Gargle the solution for approximately 15-30 seconds and then spit.  It is important not to swallow the solution.  You can also use throat lozenges/cough drops and Chloraseptic spray to help with throat pain or discomfort.  Warm or cold liquids can also be helpful in relieving throat pain.  For headache, pain or general discomfort, you can use Ibuprofen or Tylenol as directed.   Some authorities believe that zinc sprays or the use of Echinacea may shorten the course of your symptoms.   HOME CARE . Only take medications as instructed by your  medical team. . Be sure to drink plenty of fluids. Water is fine as well as fruit juices, sodas and electrolyte beverages. You may want to stay away from caffeine or alcohol. If you are nauseated, try taking small sips of liquids. How do you know if you are getting enough fluid? Your urine should be a pale yellow or almost colorless. . Get rest. . Taking a steamy shower or using a humidifier may help nasal congestion and ease sore throat pain. You can place a towel over your head and breathe in the steam from hot water coming from a faucet. . Using a saline nasal spray works much the same way. . Cough drops, hard candies and sore throat lozenges  may ease your cough. . Avoid close contacts especially the very young and the elderly . Cover your mouth if you cough or sneeze . Always remember to wash your hands.   GET HELP RIGHT AWAY IF: . You develop worsening fever. . If your symptoms do not improve within 10 days . You develop yellow or green discharge from your nose over 3 days. . You have coughing fits . You develop a severe head ache or visual changes. . You develop shortness of breath, difficulty breathing or start having chest pain . Your symptoms persist after you have completed your treatment plan  MAKE SURE YOU   Understand these instructions.  Will watch your condition.  Will get help right away if you are not doing well or get worse.  Your e-visit answers were reviewed by a board certified advanced clinical practitioner to complete your personal care plan. Depending upon the condition, your plan could have included both over the counter or prescription medications. Please review your pharmacy choice. If there is a problem, you may call our nursing hot line at and have the prescription routed to another pharmacy. Your safety is important to [place]. If you have drug allergies check your prescription carefully.   You can use MyChart to ask questions about today's visit, request a non-urgent call back, or ask for a work or school excuse for 24 hours related to this e-Visit. If it has been greater than 24 hours you will need to follow up with your provider, or enter a new e-Visit to address those concerns. You will get an e-mail in the next two days asking about your experience.  I hope that your e-visit has been valuable and will speed your recovery. Thank you for using e-visits.

## 2018-03-12 ENCOUNTER — Ambulatory Visit: Payer: Managed Care, Other (non HMO) | Admitting: Family Medicine

## 2018-03-15 ENCOUNTER — Ambulatory Visit: Payer: Self-pay | Admitting: Family Medicine

## 2018-03-22 ENCOUNTER — Encounter: Payer: Self-pay | Admitting: Family Medicine

## 2018-03-22 ENCOUNTER — Ambulatory Visit: Payer: Managed Care, Other (non HMO) | Admitting: Family Medicine

## 2018-03-22 VITALS — BP 130/76 | Ht 71.5 in | Wt 261.0 lb

## 2018-03-22 DIAGNOSIS — I10 Essential (primary) hypertension: Secondary | ICD-10-CM

## 2018-03-22 MED ORDER — ENALAPRIL MALEATE 10 MG PO TABS: 10.0000 mg | ORAL_TABLET | Freq: Every day | ORAL | 5 refills | Status: DC
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
## 2018-03-30 ENCOUNTER — Ambulatory Visit: Payer: Managed Care, Other (non HMO) | Admitting: Urology

## 2018-03-30 DIAGNOSIS — N486 Induration penis plastica: Secondary | ICD-10-CM

## 2018-03-31 DIAGNOSIS — M25562 Pain in left knee: Secondary | ICD-10-CM | POA: Insufficient documentation

## 2018-04-16 ENCOUNTER — Ambulatory Visit (INDEPENDENT_AMBULATORY_CARE_PROVIDER_SITE_OTHER): Payer: Managed Care, Other (non HMO) | Admitting: Family Medicine

## 2018-04-16 ENCOUNTER — Encounter (INDEPENDENT_AMBULATORY_CARE_PROVIDER_SITE_OTHER): Payer: Self-pay | Admitting: Family Medicine

## 2018-04-16 DIAGNOSIS — M25531 Pain in right wrist: Secondary | ICD-10-CM | POA: Diagnosis not present

## 2018-04-16 MED ORDER — ETODOLAC 400 MG PO TABS: 400.0000 mg | ORAL_TABLET | Freq: Two times a day (BID) | ORAL | 3 refills | Status: DC | PRN

## 2018-04-16 MED ORDER — DICLOFENAC SODIUM 1 % TD GEL: 4.0000 g | Freq: Four times a day (QID) | TRANSDERMAL | 6 refills | Status: DC | PRN
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
## 2018-04-16 NOTE — Progress Notes (Signed)
I saw and examined the patient with Dr. Okey Dupre and agree with assessment and plan as outlined.  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  Treatment with wrist brace and anti-inflammatories.  If pain persist, cortisone injection.

## 2018-06-09 ENCOUNTER — Encounter: Payer: Self-pay | Admitting: Family Medicine

## 2018-06-09 ENCOUNTER — Ambulatory Visit: Payer: Managed Care, Other (non HMO) | Admitting: Family Medicine

## 2018-06-09 VITALS — Temp 98.6°F | Wt 265.6 lb

## 2018-06-09 DIAGNOSIS — J111 Influenza due to unidentified influenza virus with other respiratory manifestations: Secondary | ICD-10-CM | POA: Diagnosis not present

## 2018-06-09 MED ORDER — OSELTAMIVIR PHOSPHATE 75 MG PO CAPS: 75.0000 mg | ORAL_CAPSULE | Freq: Two times a day (BID) | ORAL | 0 refills | Status: AC

## 2018-06-09 NOTE — Patient Instructions (Signed)

## 2018-06-09 NOTE — Progress Notes (Signed)
   Subjective:    Patient ID: Fernando Riley, male    DOB: [birth year], 68 y.o.   MRN: [id number]  Cough  This is a new problem. The cough is productive of sputum. Associated symptoms include chills, headaches, rhinorrhea, a sore throat and wheezing. Pertinent negatives include no ear pain, fever or shortness of breath. Associated symptoms comments: Head and chest congestion. Treatments tried: Mucinex, Vit C, Advil, Bendadryl Sinus/Allergy. The treatment provided mild relief.   2/24 evening started with rhinorrhea. Next day woke up with sneezing, cough, congestion, sore throat. Last night and this morning started with deep cough - productive of yellow mucous. Reports right frontal h/a and pressure behind both eyes. Chills yesterday, no body aches. No V/D. No fevers. Reports decreased appetite and energy levels.   No known sick contacts.    Review of Systems  Constitutional: Positive for activity change, appetite change and chills. Negative for fever.  HENT: Positive for congestion, rhinorrhea, sinus pressure and sore throat. Negative for ear pain.   Eyes: Negative for discharge.  Respiratory: Positive for cough and wheezing. Negative for shortness of breath.   Gastrointestinal: Negative for diarrhea, nausea and vomiting.  Neurological: Positive for headaches.       Objective:   Physical Exam Vitals signs and nursing note reviewed.  Constitutional:      General: He is not in acute distress.    Appearance: Normal appearance. He is not toxic-appearing.  HENT:     Head: Normocephalic and atraumatic.     Right Ear: Tympanic membrane normal.     Left Ear: Tympanic membrane normal.     Nose: Congestion present.     Right Sinus: Maxillary sinus tenderness and frontal sinus tenderness present.     Left Sinus: No maxillary sinus tenderness or frontal sinus tenderness.     Mouth/Throat:     Mouth: Mucous membranes are moist.     Pharynx: Oropharynx is clear.  Eyes:     General:        Right  eye: No discharge.        Left eye: No discharge.  Neck:     Musculoskeletal: Neck supple. No neck rigidity.  Cardiovascular:     Rate and Rhythm: Normal rate and regular rhythm.     Heart sounds: Normal heart sounds.  Pulmonary:     Effort: Pulmonary effort is normal. No respiratory distress.     Breath sounds: Normal breath sounds. No wheezing or rales.  Lymphadenopathy:     Cervical: No cervical adenopathy.  Skin:    General: Skin is warm and dry.  Neurological:     Mental Status: He is alert and oriented to person, place, and time.  Psychiatric:        Behavior: Behavior normal.           Assessment & Plan:  Influenza  Discussed likely viral etiology at this point, potentially flu or flu-like illness. Will go ahead and treat with tamiflu since patient is within the 48 hr window for treatment. 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 Pt aware to monitor how he responds to medication. Symptomatic care discussed. Warning signs discussed. F/u if symptoms worsen or fail to improve.   Dr. Mickie Hillier was consulted on this case and is in agreement with the above treatment plan.

## 2018-08-09 ENCOUNTER — Telehealth (INDEPENDENT_AMBULATORY_CARE_PROVIDER_SITE_OTHER): Payer: Self-pay

## 2018-08-09 NOTE — Telephone Encounter (Signed)
Requests refill of Etodolac (was prescribed for his wrist).

## 2018-09-21 ENCOUNTER — Encounter: Payer: Self-pay | Admitting: Family Medicine

## 2018-09-22 ENCOUNTER — Encounter: Payer: Self-pay | Admitting: Family Medicine

## 2018-09-22 ENCOUNTER — Other Ambulatory Visit: Payer: Self-pay

## 2018-09-22 ENCOUNTER — Ambulatory Visit (INDEPENDENT_AMBULATORY_CARE_PROVIDER_SITE_OTHER): Payer: Managed Care, Other (non HMO) | Admitting: Family Medicine

## 2018-09-22 VITALS — BP 118/76 | Temp 97.8°F | Ht 71.0 in | Wt 257.0 lb

## 2018-09-22 DIAGNOSIS — Z125 Encounter for screening for malignant neoplasm of prostate: Secondary | ICD-10-CM | POA: Diagnosis not present

## 2018-09-22 DIAGNOSIS — Z13220 Encounter for screening for lipoid disorders: Secondary | ICD-10-CM | POA: Diagnosis not present

## 2018-09-22 DIAGNOSIS — I10 Essential (primary) hypertension: Secondary | ICD-10-CM | POA: Diagnosis not present

## 2018-09-22 DIAGNOSIS — Z0000 Encounter for general adult medical examination without abnormal findings: Secondary | ICD-10-CM

## 2018-09-22 MED ORDER — ENALAPRIL MALEATE 10 MG PO TABS: 10.0000 mg | ORAL_TABLET | Freq: Every day | ORAL | 1 refills | Status: DC
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
## 2018-09-25 LAB — HEPATIC FUNCTION PANEL
ALT: 14 IU/L (ref 0–44)
AST: 17 IU/L (ref 0–40)
Albumin: 3.8 g/dL (ref 3.8–4.8)
Alkaline Phosphatase: 79 IU/L (ref 39–117)
Bilirubin Total: 0.8 mg/dL (ref 0.0–1.2)
Bilirubin, Direct: 0.24 mg/dL (ref 0.00–0.40)
Total Protein: 6 g/dL (ref 6.0–8.5)

## 2018-09-25 LAB — LIPID PANEL
Chol/HDL Ratio: 3.6 ratio (ref 0.0–5.0)
Cholesterol, Total: 190 mg/dL (ref 100–199)
HDL: 53 mg/dL (ref >39)
LDL Calculated: 122 mg/dL — ABNORMAL HIGH (ref 0–99)
Triglycerides: 75 mg/dL (ref 0–149)
VLDL Cholesterol Cal: 15 mg/dL (ref 5–40)

## 2018-09-25 LAB — BASIC METABOLIC PANEL
BUN/Creatinine Ratio: 18 (ref 10–24)
BUN: 17 mg/dL (ref 8–27)
CO2: 23 mmol/L (ref 20–29)
Calcium: 8.6 mg/dL (ref 8.6–10.2)
Chloride: 107 mmol/L — ABNORMAL HIGH (ref 96–106)
Creatinine, Ser: 0.93 mg/dL (ref 0.76–1.27)
GFR calc Af Amer: 99 mL/min/{1.73_m2} (ref >59)
GFR calc non Af Amer: 85 mL/min/{1.73_m2} (ref >59)
Glucose: 100 mg/dL — ABNORMAL HIGH (ref 65–99)
Potassium: 4 mmol/L (ref 3.5–5.2)
Sodium: 141 mmol/L (ref 134–144)

## 2018-09-25 LAB — PSA: Prostate Specific Ag, Serum: 0.3 ng/mL (ref 0.0–4.0)

## 2018-09-27 ENCOUNTER — Telehealth: Payer: Self-pay | Admitting: Family Medicine

## 2018-09-27 NOTE — Telephone Encounter (Signed)
Patient had disability form faxed over to be filled out by physician.in your box .

## 2018-09-28 ENCOUNTER — Other Ambulatory Visit: Payer: Self-pay | Admitting: *Deleted

## 2018-09-28 DIAGNOSIS — Z029 Encounter for administrative examinations, unspecified: Secondary | ICD-10-CM

## 2018-09-28 MED ORDER — ENALAPRIL MALEATE 10 MG PO TABS: 10.0000 mg | ORAL_TABLET | Freq: Every day | ORAL | 1 refills | Status: DC

## 2018-09-30 ENCOUNTER — Encounter: Payer: Self-pay | Admitting: Family Medicine

## 2018-10-11 DIAGNOSIS — Z029 Encounter for administrative examinations, unspecified: Secondary | ICD-10-CM

## 2018-11-08 ENCOUNTER — Telehealth: Payer: Self-pay

## 2018-11-08 MED ORDER — ETODOLAC 400 MG PO TABS: 400.0000 mg | ORAL_TABLET | Freq: Two times a day (BID) | ORAL | 3 refills | Status: DC | PRN

## 2018-11-08 NOTE — Addendum Note (Signed)
Addended by: Hortencia Pilar on: 11/08/2018 03:43 PM   Modules accepted: Orders

## 2018-11-08 NOTE — Telephone Encounter (Signed)
Sent!

## 2018-11-08 NOTE — Telephone Encounter (Signed)
Patient requests refill of Etodolac 400 mg to [facility].

## 2018-11-25 ENCOUNTER — Telehealth: Payer: Self-pay

## 2018-11-25 NOTE — Telephone Encounter (Signed)
The patient has been taking etodolac, which has worked well, but it it a tier 3 medication and costs more. He is asking if something compatible (but cheaper) can be sent in to [facility] (90-day supply).

## 2018-11-26 MED ORDER — NABUMETONE 750 MG PO TABS: 750.0000 mg | ORAL_TABLET | Freq: Two times a day (BID) | ORAL | 6 refills | Status: DC | PRN

## 2018-11-26 NOTE — Addendum Note (Signed)
Addended by: Hortencia Pilar on: 11/26/2018 08:07 AM   Modules accepted: Orders

## 2018-11-26 NOTE — Telephone Encounter (Signed)
New Rx sent.

## 2018-12-25 DIAGNOSIS — M1712 Unilateral primary osteoarthritis, left knee: Secondary | ICD-10-CM | POA: Insufficient documentation

## 2018-12-25 DIAGNOSIS — M1711 Unilateral primary osteoarthritis, right knee: Secondary | ICD-10-CM | POA: Insufficient documentation

## 2018-12-30 DIAGNOSIS — Z029 Encounter for administrative examinations, unspecified: Secondary | ICD-10-CM

## 2019-01-12 ENCOUNTER — Other Ambulatory Visit: Payer: Self-pay

## 2019-01-12 ENCOUNTER — Ambulatory Visit (INDEPENDENT_AMBULATORY_CARE_PROVIDER_SITE_OTHER): Payer: Medicare Other | Admitting: Family Medicine

## 2019-01-12 VITALS — BP 130/82 | Temp 97.3°F | Ht 71.0 in | Wt 258.8 lb

## 2019-01-12 DIAGNOSIS — Z23 Encounter for immunization: Secondary | ICD-10-CM | POA: Diagnosis not present

## 2019-01-12 DIAGNOSIS — G4733 Obstructive sleep apnea (adult) (pediatric): Secondary | ICD-10-CM

## 2019-01-12 DIAGNOSIS — M1711 Unilateral primary osteoarthritis, right knee: Secondary | ICD-10-CM

## 2019-01-12 DIAGNOSIS — I10 Essential (primary) hypertension: Secondary | ICD-10-CM | POA: Diagnosis not present

## 2019-01-12 NOTE — Progress Notes (Signed)
   Subjective:    Patient ID: Fernando Riley, male    DOB: 15-Apr-1950, 68 y.o.   MRN: [id number]  HPI  Patient arrives for preoperative clearance for total knee replacement. Surgery scheduled for 01/25/2019 with Dr Alvan Dame. PE 09/2018  Pt    Blood pressure medicine and blood pressure levels reviewed today with patient. Compliant with blood pressure medicine. States does not miss a dose. No obvious side effects. Blood pressure generally good when checked elsewhere. Watching salt intake.    Not using the slep apnea machine as much . 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       Review of Systems No headache, no major weight loss or weight gain, no chest pain no back pain abdominal pain no change in bowel habits complete ROS otherwise negative     Objective:   Physical Exam  Alert and oriented, vitals reviewed and stable, NAD ENT-TM's and ext canals WNL bilat via otoscopic exam Soft palate, tonsils and post pharynx WNL via oropharyngeal exam Neck-symmetric, no masses; thyroid nonpalpable and nontender Pulmonary-no tachypnea or accessory muscle use; Clear without wheezes via auscultation Card--no abnrml murmurs, rhythm reg and rate WNL Carotid pulses symmetric, without bruits  Right knee obvious swelling and crepitations with extension     Assessment & Plan:  Impression 1 hypertension.  Good control during discussed to maintain same meds  2.  Obstructive sleep apnea.  Clinically stable.  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  3. progressive right knee disability secondary to severe arthritis.  Discussed.  Knee replacement preoperative clearance.  Patient has no contraindications to the surgery.  Forms will be filled out and sent in this regard.  Flu shot and pneumonia shot  Greater than 50% of this 25 minute face to face visit was spent in counseling and discussion and coordination of care regarding the  above diagnosis/diagnosies

## 2019-01-18 ENCOUNTER — Encounter (HOSPITAL_COMMUNITY): Payer: Self-pay

## 2019-01-18 NOTE — Patient Instructions (Addendum)
DUE TO COVID-19 ONLY ONE VISITOR IS ALLOWED TO COME WITH YOU AND STAY IN THE WAITING ROOM ONLY DURING PRE OP AND PROCEDURE. THE ONE VISITOR MAY VISIT WITH YOU IN YOUR PRIVATE ROOM DURING VISITING HOURS ONLY!!   COVID SWAB TESTING MUST BE COMPLETED ON: Friday, Oct. 9, 2020 at 9:40AM [address] [place], [place] - the short stay covered drive at [facility] (Use the [employer] entrance to [facility] next to [facility].) (Must self quarantine after testing. Follow instructions on handout.)        Your procedure is scheduled on: Tuesday, Oct. 13, 2020   Report to [facility] Main  Entrance    Report to admitting at 10:25 AM   Call this number if you have problems the morning of surgery [phone]    Bring CPAP mask and tubing day of surgery   Do not eat food :After Midnight.   May have liquids until 9:45 AM day of surgery   CLEAR LIQUID DIET  Foods Allowed                                                                     Foods Excluded  Water, Black Coffee and tea, regular and decaf                             liquids that you cannot  Plain Jell-O in any flavor  (No red)                                           see through such as: Fruit ices (not with fruit pulp)                                     milk, soups, orange juice  Iced Popsicles (No red)                                    All solid food Carbonated beverages, regular and diet                                    Apple juices Sports drinks like Gatorade (No red) Lightly seasoned clear broth or consume(fat free) Sugar, honey syrup  Sample Menu Breakfast                                Lunch                                     Supper Cranberry juice                    Beef broth  Chicken broth Jell-O                                     Grape juice                           Apple juice Coffee or tea                        Jell-O                                       Popsicle                                                Coffee or tea                        Coffee or tea   Complete one Ensure drink the morning of surgery at  9:45AM the day of surgery.   Brush your teeth the morning of surgery.   Do NOT smoke after Midnight   Take these medicines the morning of surgery with A SIP OF WATER: None                               You may not have any metal on your body including jewelry, and body piercings             Do not wear lotions, powders, perfumes/cologne, or deodorant                          Men may shave face and neck.   Do not bring valuables to the hospital. [place].   Contacts, dentures or bridgework may not be worn into surgery.   Bring small overnight bag day of surgery.    Special Instructions: Bring a copy of your healthcare power of attorney and living will documents         the day of surgery if you haven't scanned them in before.              Please read over the following fact sheets you were given:  [facility] - Preparing for Surgery Before surgery, you can play an important role.  Because skin is not sterile, your skin needs to be as free of germs as possible.  You can reduce the number of germs on your skin by washing with CHG (chlorahexidine gluconate) soap before surgery.  CHG is an antiseptic cleaner which kills germs and bonds with the skin to continue killing germs even after washing. Please DO NOT use if you have an allergy to CHG or antibacterial soaps.  If your skin becomes reddened/irritated stop using the CHG and inform your nurse when you arrive at Short Stay. Do not shave (including legs and underarms) for at least 48 hours prior to the first CHG shower.  You may shave your face/neck.  Please follow these  instructions carefully:  1.  Shower with CHG Soap the night before surgery and the  morning of surgery.  2.  If you choose to wash your hair, wash your  hair first as usual with your normal  shampoo.  3.  After you shampoo, rinse your hair and body thoroughly to remove the shampoo.                             4.  Use CHG as you would any other liquid soap.  You can apply chg directly to the skin and wash.  Gently with a scrungie or clean washcloth.  5.  Apply the CHG Soap to your body ONLY FROM THE NECK DOWN.   Do   not use on face/ open                           Wound or open sores. Avoid contact with eyes, ears mouth and   genitals (private parts).                       Wash face,  Genitals (private parts) with your normal soap.             6.  Wash thoroughly, paying special attention to the area where your    surgery  will be performed.  7.  Thoroughly rinse your body with warm water from the neck down.  8.  DO NOT shower/wash with your normal soap after using and rinsing off the CHG Soap.                9.  Pat yourself dry with a clean towel.            10.  Wear clean pajamas.            11.  Place clean sheets on your bed the night of your first shower and do not  sleep with pets. Day of Surgery : Do not apply any lotions/deodorants the morning of surgery.  Please wear clean clothes to the hospital/surgery center.  FAILURE TO FOLLOW THESE INSTRUCTIONS MAY RESULT IN THE CANCELLATION OF YOUR SURGERY  PATIENT SIGNATURE_________________________________  NURSE SIGNATURE__________________________________  ________________________________________________________________________   Adam Phenix  An incentive spirometer is a tool that can help keep your lungs clear and active. This tool measures how well you are filling your lungs with each breath. Taking long deep breaths may help reverse or decrease the chance of developing breathing (pulmonary) problems (especially infection) following:  A long period of time when you are unable to move or be active. BEFORE THE PROCEDURE   If the spirometer includes an indicator to show your  best effort, your nurse or respiratory therapist will set it to a desired goal.  If possible, sit up straight or lean slightly forward. Try not to slouch.  Hold the incentive spirometer in an upright position. INSTRUCTIONS FOR USE  1. Sit on the edge of your bed if possible, or sit up as far as you can in bed or on a chair. 2. Hold the incentive spirometer in an upright position. 3. Breathe out normally. 4. Place the mouthpiece in your mouth and seal your lips tightly around it. 5. Breathe in slowly and as deeply as possible, raising the piston or the ball toward the top of the column. 6. Hold your breath for 3-5 seconds or for as long  as possible. Allow the piston or ball to fall to the bottom of the column. 7. Remove the mouthpiece from your mouth and breathe out normally. 8. Rest for a few seconds and repeat Steps 1 through 7 at least 10 times every 1-2 hours when you are awake. Take your time and take a few normal breaths between deep breaths. 9. The spirometer may include an indicator to show your best effort. Use the indicator as a goal to work toward during each repetition. 10. After each set of 10 deep breaths, practice coughing to be sure your lungs are clear. If you have an incision (the cut made at the time of surgery), support your incision when coughing by placing a pillow or rolled up towels firmly against it. Once you are able to get out of bed, walk around indoors and cough well. You may stop using the incentive spirometer when instructed by your caregiver.  RISKS AND COMPLICATIONS  Take your time so you do not get dizzy or light-headed.  If you are in pain, you may need to take or ask for pain medication before doing incentive spirometry. It is harder to take a deep breath if you are having pain. AFTER USE  Rest and breathe slowly and easily.  It can be helpful to keep track of a log of your progress. Your caregiver can provide you with a simple table to help with this. If  you are using the spirometer at home, follow these instructions: [place]. IF:   You are having difficultly using the spirometer.  You have trouble using the spirometer as often as instructed.  Your pain medication is not giving enough relief while using the spirometer.  You develop fever of 100.5 F (38.1 C) or higher. SEEK IMMEDIATE MEDICAL CARE IF:   You cough up bloody sputum that had not been present before.  You develop fever of 102 F (38.9 C) or greater.  You develop worsening pain at or near the incision site. MAKE SURE YOU:   Understand these instructions.  Will watch your condition.  Will get help right away if you are not doing well or get worse. Document Released: 08/11/2006 Document Revised: 06/23/2011 Document Reviewed: 10/12/2006 ExitCare Patient Information 2014 ExitCare, [place].   ________________________________________________________________________  WHAT IS A BLOOD TRANSFUSION? Blood Transfusion Information  A transfusion is the replacement of blood or some of its parts. Blood is made up of multiple cells which provide different functions.  Red blood cells carry oxygen and are used for blood loss replacement.  White blood cells fight against infection.  Platelets control bleeding.  Plasma helps clot blood.  Other blood products are available for specialized needs, such as hemophilia or other clotting disorders. BEFORE THE TRANSFUSION  Who gives blood for transfusions?   Healthy volunteers who are fully evaluated to make sure their blood is safe. This is blood bank blood. Transfusion therapy is the safest it has ever been in the practice of medicine. Before blood is taken from a donor, a complete history is taken to make sure that person has no history of diseases nor engages in risky social behavior (examples are intravenous drug use or sexual activity with multiple partners). The donor's travel history is screened to minimize risk of  transmitting infections, such as malaria. The donated blood is tested for signs of infectious diseases, such as HIV and hepatitis. The blood is then tested to be sure it is compatible with you in order to minimize the chance of  a transfusion reaction. If you or a relative donates blood, this is often done in anticipation of surgery and is not appropriate for emergency situations. It takes many days to process the donated blood. RISKS AND COMPLICATIONS Although transfusion therapy is very safe and saves many lives, the main dangers of transfusion include:   Getting an infectious disease.  Developing a transfusion reaction. This is an allergic reaction to something in the blood you were given. Every precaution is taken to prevent this. The decision to have a blood transfusion has been considered carefully by your caregiver before blood is given. Blood is not given unless the benefits outweigh the risks. AFTER THE TRANSFUSION  Right after receiving a blood transfusion, you will usually feel much better and more energetic. This is especially true if your red blood cells have gotten low (anemic). The transfusion raises the level of the red blood cells which carry oxygen, and this usually causes an energy increase.  The nurse administering the transfusion will monitor you carefully for complications. HOME CARE INSTRUCTIONS  No special instructions are needed after a transfusion. You may find your energy is better. Speak with your caregiver about any limitations on activity for underlying diseases you may have. SEEK MEDICAL CARE IF:   Your condition is not improving after your transfusion.  You develop redness or irritation at the intravenous (IV) site. SEEK IMMEDIATE MEDICAL CARE IF:  Any of the following symptoms occur over the next 12 hours:  Shaking chills.  You have a temperature by mouth above 102 F (38.9 C), not controlled by medicine.  Chest, back, or muscle pain.  People around you  feel you are not acting correctly or are confused.  Shortness of breath or difficulty breathing.  Dizziness and fainting.  You get a rash or develop hives.  You have a decrease in urine output.  Your urine turns a dark color or changes to pink, red, or brown. Any of the following symptoms occur over the next 10 days:  You have a temperature by mouth above 102 F (38.9 C), not controlled by medicine.  Shortness of breath.  Weakness after normal activity.  The white part of the eye turns yellow (jaundice).  You have a decrease in the amount of urine or are urinating less often.  Your urine turns a dark color or changes to pink, red, or brown. Document Released: 03/28/2000 Document Revised: 06/23/2011 Document Reviewed: 11/15/2007 [employer] [facility] Patient Information 2014 [place], [place].  _______________________________________________________________________

## 2019-01-19 ENCOUNTER — Encounter (HOSPITAL_COMMUNITY): Payer: Self-pay

## 2019-01-19 ENCOUNTER — Other Ambulatory Visit: Payer: Self-pay

## 2019-01-19 ENCOUNTER — Encounter (HOSPITAL_COMMUNITY)
Admission: RE | Admit: 2019-01-19 | Discharge: 2019-01-19 | Disposition: A | Discharge status: Home / Self Care | Payer: No Typology Code available for payment source | Source: Ambulatory Visit | Attending: Orthopedic Surgery | Admitting: Orthopedic Surgery

## 2019-01-19 DIAGNOSIS — Z20828 Contact with and (suspected) exposure to other viral communicable diseases: Secondary | ICD-10-CM | POA: Insufficient documentation

## 2019-01-19 DIAGNOSIS — Z01818 Encounter for other preprocedural examination: Secondary | ICD-10-CM | POA: Insufficient documentation

## 2019-01-19 DIAGNOSIS — M1711 Unilateral primary osteoarthritis, right knee: Secondary | ICD-10-CM | POA: Diagnosis not present

## 2019-01-19 DIAGNOSIS — M25561 Pain in right knee: Secondary | ICD-10-CM | POA: Insufficient documentation

## 2019-01-19 HISTORY — DX: Malignant melanoma of skin, unspecified: C43.9

## 2019-01-19 HISTORY — DX: Personal history of urinary calculi: Z87.442

## 2019-01-19 HISTORY — DX: Induration penis plastica: N48.6

## 2019-01-19 HISTORY — DX: Obstructive sleep apnea (adult) (pediatric): G47.33

## 2019-01-19 HISTORY — DX: Personal history of other diseases of the nervous system and sense organs: Z86.69

## 2019-01-19 HISTORY — DX: Unspecified osteoarthritis, unspecified site: M19.90

## 2019-01-19 LAB — SURGICAL PCR SCREEN
MRSA, PCR: NEGATIVE
Staphylococcus aureus: NEGATIVE

## 2019-01-19 LAB — BASIC METABOLIC PANEL
Anion gap: 16 — ABNORMAL HIGH (ref 5–15)
BUN: 19 mg/dL (ref 8–23)
CO2: 20 mmol/L — ABNORMAL LOW (ref 22–32)
Calcium: 8.9 mg/dL (ref 8.9–10.3)
Chloride: 104 mmol/L (ref 98–111)
Creatinine, Ser: 0.96 mg/dL (ref 0.61–1.24)
GFR calc Af Amer: >60 mL/min (ref >60)
GFR calc non Af Amer: >60 mL/min (ref >60)
Glucose, Bld: 92 mg/dL (ref 70–99)
Potassium: 4.2 mmol/L (ref 3.5–5.1)
Sodium: 140 mmol/L (ref 135–145)

## 2019-01-19 LAB — CBC
HCT: 44.2 % (ref 39.0–52.0)
Hemoglobin: 14.2 g/dL (ref 13.0–17.0)
MCH: 31.7 pg (ref 26.0–34.0)
MCHC: 32.1 g/dL (ref 30.0–36.0)
MCV: 98.7 fL (ref 80.0–100.0)
Platelets: 229 10*3/uL (ref 150–400)
RBC: 4.48 MIL/uL (ref 4.22–5.81)
RDW: 12.8 % (ref 11.5–15.5)
WBC: 6.9 10*3/uL (ref 4.0–10.5)
nRBC: 0 % (ref 0.0–0.2)

## 2019-01-19 LAB — ABO/RH: ABO/RH(D): O POS

## 2019-01-19 NOTE — H&P (Signed)
TOTAL KNEE ADMISSION H&P  Patient is being admitted for right total knee arthroplasty.  Subjective:  Chief Complaint:right knee pain.  HPI: Fernando Riley, 68 y.o. male, has a history of pain and functional disability in the right knee due to arthritis and has failed non-surgical conservative treatments for greater than 12 weeks to includecorticosteriod injections, viscosupplementation injections and activity modification.  Onset of symptoms was gradual, starting 1 years ago with gradually worsening course since that time. The patient noted prior procedures on the knee to include  arthroscopy and menisectomy on the right knee(s).  Patient currently rates pain in the right knee(s) at 6 out of 10 with activity. Patient has worsening of pain with activity and weight bearing and pain that interferes with activities of daily living.  Patient has evidence of evidence of advanced joint space loss in both knees worse in the medial compartment associated with moderate patellofemoral arthritis. by imaging studies. There is no active infection.  Patient Active Problem List   Diagnosis Date Noted   Peyronie's disease 09/14/2017   Viral syndrome 11/16/2015   Obstructive sleep apnea 08/20/2015   Essential hypertension, benign 07/20/2013   Past Medical History:  Diagnosis Date   Arthritis    Cat allergies    Fatty liver    History of kidney stones    History of migraine    Hypertension    Impaired fasting glucose    Melanoma (HCC)    lower legs bilaterally   OSA (obstructive sleep apnea)    occ use of CPAP   Peyronie disease    Pollen allergies    Reactive airways dysfunction syndrome ([place])    Venous stasis     Past Surgical History:  Procedure Laterality Date   COLONOSCOPY N/A 01/21/2016   Procedure: COLONOSCOPY;  Surgeon: Daneil Dolin, MD;  Location: [facility];  Service: Endoscopy;  Laterality: N/A;  1:00 PM - moved to 12:30 - office notified per Tretha Sciara   KNEE  ARTHROSCOPY Bilateral    POLYPECTOMY  01/21/2016   Procedure: POLYPECTOMY;  Surgeon: Daneil Dolin, MD;  Location: [facility];  Service: Endoscopy;;  colon   SHOULDER ARTHROSCOPY Left    SHOULDER SURGERY Right 2016    No current facility-administered medications for this encounter.    Current Outpatient Medications  Medication Sig Dispense Refill Last Dose   aspirin 81 MG tablet Take 81 mg by mouth daily.      diclofenac sodium (VOLTAREN) 1 % GEL Apply 4 g topically 4 (four) times daily as needed. (Patient taking differently: Apply 4 g topically 4 (four) times daily as needed (pain). ) 500 g 6    enalapril (VASOTEC) 10 MG tablet Take 1 tablet (10 mg total) by mouth daily. 90 tablet 1    TURMERIC PO Take 1,000 mg by mouth daily.       Allergies  Allergen Reactions   Other     Cats   Red Dye     Social History   Tobacco Use   Smoking status: Never Smoker   Smokeless tobacco: Never Used  Substance Use Topics   Alcohol use: No    Family History  Problem Relation Age of Onset   Heart disease Father    Diabetes Brother    Hyperlipidemia Brother      Review of Systems  Constitutional: Negative for chills and fever.  Respiratory: Negative for cough and shortness of breath.   Cardiovascular: Negative for chest pain and palpitations.  Gastrointestinal: Negative  for nausea and vomiting.  Musculoskeletal: Positive for joint pain.    Objective:  Physical Exam Patient is a 68 year old male.  Well nourished and well developed. General: Alert and oriented x3, cooperative and pleasant, no acute distress. Head: normocephalic, atraumatic, neck supple. Eyes: EOMI. Respiratory: breath sounds clear in all fields, no wheezing, rales, or rhonchi. Cardiovascular: Regular rate and rhythm, no murmurs, gallops or rubs. Abdomen: non-tender to palpation and soft, normoactive bowel sounds.  Musculoskeletal: Bilateral knee exams: Slight genu varum Full knee extension and  flexion of 120 with some tightness Stable ligaments bilaterally Tenderness mainly over the medial and anterior aspect the joint No significant crepitation Neurovascular intact without evidence of lower extremity edema or cellulitis  Calves soft and nontender. Motor function intact in LE. Strength 5/5 LE bilaterally. Neuro: Distal pulses 2+. Sensation to light touch intact in LE.   Vital signs in last 24 hours: Temp:  [98.3 F (36.8 C)] 98.3 F (36.8 C) (10/07 0802) Pulse Rate:  [62] 62 (10/07 0802) Resp:  [18] 18 (10/07 0802) BP: (141)/(75) 141/75 (10/07 0802) SpO2:  [100 %] 100 % (10/07 0802) Weight:  [116.1 kg] 116.1 kg (10/07 0802)  Labs:   Estimated body mass index is 34.72 kg/m as calculated from the following:   Height as of 01/19/19: 6' (1.829 m).   Weight as of 01/19/19: 116.1 kg.   Imaging Review Plain radiographs demonstrate severe degenerative joint disease of the right knee(s). The overall alignment isneutral. The bone quality appears to be adequate for age and reported activity level.   Assessment/Plan:  End stage arthritis, right knee   The patient history, physical examination, clinical judgment of the provider and imaging studies are consistent with end stage degenerative joint disease of the right knee(s) and total knee arthroplasty is deemed medically necessary. The treatment options including medical management, injection therapy arthroscopy and arthroplasty were discussed at length. The risks and benefits of total knee arthroplasty were presented and reviewed. The risks due to aseptic loosening, infection, stiffness, patella tracking problems, thromboembolic complications and other imponderables were discussed. The patient acknowledged the explanation, agreed to proceed with the plan and consent was signed. Patient is being admitted for inpatient treatment for surgery, pain control, PT, OT, prophylactic antibiotics, VTE prophylaxis, progressive ambulation and  ADL's and discharge planning. The patient is planning to be discharged home.  Therapy Plans: outpatient therapy at [facility] in [place] Disposition: Home with wife Planned DVT Prophylaxis: aspirin 81mg  BID DME needed: walker, 3-n-1 PCP: Dr. Rosemary Holms TXA: IV Allergies: NKDA Anesthesia Concerns: none BMI: 34.6  Other: Norco okay, celebrex okay. Hx of melanoma.  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 Work note given to be out of work until post operative follow up.  Patient's anticipated LOS is less than 2 midnights, meeting these requirements: - Lives within 1 hour of care - Has a competent adult at home to recover with post-op recover - NO history of  - Chronic pain requiring opiods  - Diabetes  - Coronary Artery Disease  - Heart failure  - Heart attack  - Stroke  - DVT/VTE  - Cardiac arrhythmia  - Respiratory Failure/COPD  - Renal failure  - Anemia  - Advanced Liver disease  - Patient was instructed on what medications to stop prior to surgery. - Follow-up visit in 2 weeks with Dr. Alvan Dame - Begin physical therapy following surgery - Pre-operative lab work as pre-surgical testing - Prescriptions will be provided in hospital at time  of discharge  Griffith Citron, PA-C Orthopedic Surgery [facility] [place] [phone]

## 2019-01-19 NOTE — Progress Notes (Signed)
SPOKE W/  Saralyn Pilar     SCREENING SYMPTOMS OF COVID 19:   COUGH--NO  RUNNY NOSE--- NO  SORE THROAT---NO  NASAL CONGESTION----NO  SNEEZING----NO  SHORTNESS OF BREATH---NO  DIFFICULTY BREATHING---NO  TEMP >100.0 -----NO  UNEXPLAINED BODY ACHES------NO  CHILLS -------- NO  HEADACHES ---------NO  LOSS OF SMELL/ TASTE --------NO    HAVE YOU OR ANY FAMILY MEMBER TRAVELLED PAST 14 DAYS OUT OF THE   COUNTY---Lives in [facility] STATE----NO COUNTRY----NO  HAVE YOU OR ANY FAMILY MEMBER BEEN EXPOSED TO ANYONE WITH COVID 19? NO

## 2019-01-19 NOTE — Progress Notes (Signed)
PCP - Dr. Marsa Aris Last office visit note 09/22/2018 in epic Cardiologist - N/A  Chest x-ray - N/A EKG - 01/19/2019 in epic Stress Test - N/A ECHO - N/A Cardiac Cath - N/A  Sleep Study - 10/24/2015 in epic CPAP - Does not wear CPAP nightly  Fasting Blood Sugar - N/A Checks Blood Sugar __N/A___ times a day  Blood Thinner Instructions: N/A  Aspirin Instructions:  Yes Last Dose:  Last dose 01/17/2019  Anesthesia review: N/A  Patient denies shortness of breath, fever, cough and chest pain at PAT appointment   Patient verbalized understanding of instructions that were given to them at the PAT appointment. Patient was also instructed that they will need to review over the PAT instructions again at home before surgery.

## 2019-01-21 ENCOUNTER — Other Ambulatory Visit (HOSPITAL_COMMUNITY): Payer: Managed Care, Other (non HMO)

## 2019-01-21 ENCOUNTER — Other Ambulatory Visit (HOSPITAL_COMMUNITY)
Admission: RE | Admit: 2019-01-21 | Discharge: 2019-01-21 | Disposition: A | Discharge status: Home / Self Care | Payer: No Typology Code available for payment source | Source: Ambulatory Visit | Attending: Orthopedic Surgery | Admitting: Orthopedic Surgery

## 2019-01-21 ENCOUNTER — Other Ambulatory Visit: Payer: Self-pay

## 2019-01-21 DIAGNOSIS — Z01818 Encounter for other preprocedural examination: Secondary | ICD-10-CM | POA: Diagnosis not present

## 2019-01-21 LAB — SARS CORONAVIRUS 2 (TAT 6-24 HRS): SARS Coronavirus 2: NEGATIVE

## 2019-01-24 ENCOUNTER — Encounter: Payer: Self-pay | Admitting: Family Medicine

## 2019-01-25 ENCOUNTER — Observation Stay (HOSPITAL_COMMUNITY)
Admission: RE | Admit: 2019-01-25 | Discharge: 2019-01-26 | Disposition: A | Discharge status: Home / Self Care | Payer: No Typology Code available for payment source | Source: Other Acute Inpatient Hospital | Attending: Orthopedic Surgery | Admitting: Orthopedic Surgery

## 2019-01-25 ENCOUNTER — Encounter (HOSPITAL_COMMUNITY)
Admission: RE | Disposition: A | Discharge status: Home / Self Care | Payer: Self-pay | Source: Other Acute Inpatient Hospital | Attending: Orthopedic Surgery

## 2019-01-25 ENCOUNTER — Encounter (HOSPITAL_COMMUNITY): Payer: Self-pay

## 2019-01-25 ENCOUNTER — Other Ambulatory Visit: Payer: Self-pay

## 2019-01-25 ENCOUNTER — Ambulatory Visit (HOSPITAL_COMMUNITY): Payer: No Typology Code available for payment source | Admitting: Physician Assistant

## 2019-01-25 ENCOUNTER — Ambulatory Visit (HOSPITAL_COMMUNITY): Payer: No Typology Code available for payment source | Admitting: Anesthesiology

## 2019-01-25 DIAGNOSIS — I10 Essential (primary) hypertension: Secondary | ICD-10-CM | POA: Diagnosis not present

## 2019-01-25 DIAGNOSIS — M25461 Effusion, right knee: Secondary | ICD-10-CM | POA: Insufficient documentation

## 2019-01-25 DIAGNOSIS — G4733 Obstructive sleep apnea (adult) (pediatric): Secondary | ICD-10-CM | POA: Insufficient documentation

## 2019-01-25 DIAGNOSIS — M659 Synovitis and tenosynovitis, unspecified: Secondary | ICD-10-CM | POA: Insufficient documentation

## 2019-01-25 DIAGNOSIS — Z6834 Body mass index (BMI) 34.0-34.9, adult: Secondary | ICD-10-CM | POA: Insufficient documentation

## 2019-01-25 DIAGNOSIS — E669 Obesity, unspecified: Secondary | ICD-10-CM | POA: Diagnosis present

## 2019-01-25 DIAGNOSIS — Z96659 Presence of unspecified artificial knee joint: Secondary | ICD-10-CM | POA: Insufficient documentation

## 2019-01-25 DIAGNOSIS — M1711 Unilateral primary osteoarthritis, right knee: Secondary | ICD-10-CM | POA: Diagnosis not present

## 2019-01-25 DIAGNOSIS — Z7982 Long term (current) use of aspirin: Secondary | ICD-10-CM | POA: Diagnosis not present

## 2019-01-25 DIAGNOSIS — Z85820 Personal history of malignant melanoma of skin: Secondary | ICD-10-CM | POA: Diagnosis not present

## 2019-01-25 DIAGNOSIS — M25761 Osteophyte, right knee: Secondary | ICD-10-CM | POA: Diagnosis not present

## 2019-01-25 DIAGNOSIS — Z96651 Presence of right artificial knee joint: Secondary | ICD-10-CM

## 2019-01-25 DIAGNOSIS — M25561 Pain in right knee: Secondary | ICD-10-CM | POA: Diagnosis present

## 2019-01-25 HISTORY — PX: TOTAL KNEE ARTHROPLASTY: SHX125

## 2019-01-25 LAB — TYPE AND SCREEN
ABO/RH(D): O POS
Antibody Screen: NEGATIVE

## 2019-01-25 SURGERY — ARTHROPLASTY, KNEE, TOTAL
Anesthesia: Monitor Anesthesia Care | Site: Knee | Laterality: Right

## 2019-01-25 MED ORDER — BUPIVACAINE HCL (PF) 0.25 % IJ SOLN
INTRAMUSCULAR | Status: AC
  Filled 2019-01-25: qty 30

## 2019-01-25 MED ORDER — PHENOL 1.4 % MT LIQD
1.0000 | OROMUCOSAL | Status: DC | PRN
  Filled 2019-01-25: qty 177

## 2019-01-25 MED ORDER — FENTANYL CITRATE (PF) 100 MCG/2ML IJ SOLN
INTRAMUSCULAR | Status: DC | PRN
  Administered 2019-01-25: 100 ug via INTRAVENOUS

## 2019-01-25 MED ORDER — CEFAZOLIN SODIUM-DEXTROSE 2-4 GM/100ML-% IV SOLN
2.0000 g | INTRAVENOUS | Status: AC
  Administered 2019-01-25: 2 g via INTRAVENOUS
  Filled 2019-01-25: qty 100

## 2019-01-25 MED ORDER — LACTATED RINGERS IV SOLN
INTRAVENOUS | Status: DC
  Administered 2019-01-25 (×2): via INTRAVENOUS

## 2019-01-25 MED ORDER — DEXAMETHASONE SODIUM PHOSPHATE 10 MG/ML IJ SOLN
10.0000 mg | Freq: Once | INTRAMUSCULAR | Status: AC
  Administered 2019-01-25: 13:00:00 10 mg via INTRAVENOUS

## 2019-01-25 MED ORDER — KETOROLAC TROMETHAMINE 30 MG/ML IJ SOLN
INTRAMUSCULAR | Status: AC
  Filled 2019-01-25: qty 1

## 2019-01-25 MED ORDER — BUPIVACAINE HCL (PF) 0.25 % IJ SOLN
INTRAMUSCULAR | Status: DC | PRN
  Administered 2019-01-25: 30 mL

## 2019-01-25 MED ORDER — SODIUM CHLORIDE (PF) 0.9 % IJ SOLN
INTRAMUSCULAR | Status: DC | PRN
  Administered 2019-01-25: 30 mL

## 2019-01-25 MED ORDER — BUPIVACAINE HCL (PF) 0.75 % IJ SOLN
INTRAMUSCULAR | Status: DC | PRN
  Administered 2019-01-25: 1.8 mL via INTRATHECAL

## 2019-01-25 MED ORDER — PHENYLEPHRINE 40 MCG/ML (10ML) SYRINGE FOR IV PUSH (FOR BLOOD PRESSURE SUPPORT)
PREFILLED_SYRINGE | INTRAVENOUS | Status: AC
  Filled 2019-01-25: qty 10

## 2019-01-25 MED ORDER — SODIUM CHLORIDE 0.9 % IV SOLN: INTRAVENOUS | Status: DC | PRN

## 2019-01-25 MED ORDER — FENTANYL CITRATE (PF) 100 MCG/2ML IJ SOLN: 25.0000 ug | INTRAMUSCULAR | Status: DC | PRN

## 2019-01-25 MED ORDER — PROPOFOL 10 MG/ML IV BOLUS
INTRAVENOUS | Status: AC
  Filled 2019-01-25: qty 20

## 2019-01-25 MED ORDER — POLYETHYLENE GLYCOL 3350 17 G PO PACK
17.0000 g | PACK | Freq: Two times a day (BID) | ORAL | Status: DC
  Administered 2019-01-25 – 2019-01-26 (×2): 17 g via ORAL
  Filled 2019-01-25 (×2): qty 1

## 2019-01-25 MED ORDER — METHOCARBAMOL 500 MG PO TABS
500.0000 mg | ORAL_TABLET | Freq: Four times a day (QID) | ORAL | Status: DC | PRN
  Administered 2019-01-26 (×2): 500 mg via ORAL
  Filled 2019-01-25 (×2): qty 1

## 2019-01-25 MED ORDER — OXYCODONE HCL 5 MG/5ML PO SOLN: 5.0000 mg | Freq: Once | ORAL | Status: DC | PRN

## 2019-01-25 MED ORDER — ACETAMINOPHEN 500 MG PO TABS: 1000.0000 mg | ORAL_TABLET | Freq: Once | ORAL | Status: DC | PRN

## 2019-01-25 MED ORDER — BISACODYL 10 MG RE SUPP: 10.0000 mg | Freq: Every day | RECTAL | Status: DC | PRN

## 2019-01-25 MED ORDER — POVIDONE-IODINE 10 % EX SWAB
2.0000 "application " | Freq: Once | CUTANEOUS | Status: AC
  Administered 2019-01-25: 2 via TOPICAL

## 2019-01-25 MED ORDER — ASPIRIN 81 MG PO CHEW
81.0000 mg | CHEWABLE_TABLET | Freq: Two times a day (BID) | ORAL | Status: DC
  Administered 2019-01-25 – 2019-01-26 (×2): 81 mg via ORAL
  Filled 2019-01-25 (×2): qty 1

## 2019-01-25 MED ORDER — ACETAMINOPHEN 325 MG PO TABS: 325.0000 mg | ORAL_TABLET | Freq: Four times a day (QID) | ORAL | Status: DC | PRN

## 2019-01-25 MED ORDER — ROPIVACAINE HCL 7.5 MG/ML IJ SOLN
INTRAMUSCULAR | Status: DC | PRN
  Administered 2019-01-25: 20 mL via PERINEURAL

## 2019-01-25 MED ORDER — ACETAMINOPHEN 160 MG/5ML PO SOLN: 1000.0000 mg | Freq: Once | ORAL | Status: DC | PRN

## 2019-01-25 MED ORDER — MENTHOL 3 MG MT LOZG: 1.0000 | LOZENGE | OROMUCOSAL | Status: DC | PRN

## 2019-01-25 MED ORDER — HYDROCODONE-ACETAMINOPHEN 7.5-325 MG PO TABS
1.0000 | ORAL_TABLET | ORAL | Status: DC | PRN
  Administered 2019-01-26 (×2): 2 via ORAL
  Filled 2019-01-25 (×2): qty 2

## 2019-01-25 MED ORDER — HYDROMORPHONE HCL 1 MG/ML IJ SOLN: 0.5000 mg | INTRAMUSCULAR | Status: DC | PRN

## 2019-01-25 MED ORDER — MIDAZOLAM HCL 2 MG/2ML IJ SOLN
1.0000 mg | INTRAMUSCULAR | Status: DC
  Administered 2019-01-25: 12:00:00 1 mg via INTRAVENOUS
  Filled 2019-01-25: qty 2

## 2019-01-25 MED ORDER — DOCUSATE SODIUM 100 MG PO CAPS
100.0000 mg | ORAL_CAPSULE | Freq: Two times a day (BID) | ORAL | Status: DC
  Administered 2019-01-25 – 2019-01-26 (×2): 100 mg via ORAL
  Filled 2019-01-25 (×2): qty 1

## 2019-01-25 MED ORDER — HYDROCODONE-ACETAMINOPHEN 5-325 MG PO TABS
1.0000 | ORAL_TABLET | ORAL | Status: DC | PRN
  Administered 2019-01-25 – 2019-01-26 (×4): 2 via ORAL
  Filled 2019-01-25 (×4): qty 2

## 2019-01-25 MED ORDER — FENTANYL CITRATE (PF) 100 MCG/2ML IJ SOLN
50.0000 ug | INTRAMUSCULAR | Status: DC
  Administered 2019-01-25: 50 ug via INTRAVENOUS
  Filled 2019-01-25: qty 2

## 2019-01-25 MED ORDER — ONDANSETRON HCL 4 MG PO TABS: 4.0000 mg | ORAL_TABLET | Freq: Four times a day (QID) | ORAL | Status: DC | PRN

## 2019-01-25 MED ORDER — MAGNESIUM CITRATE PO SOLN: 1.0000 | Freq: Once | ORAL | Status: DC | PRN

## 2019-01-25 MED ORDER — DIPHENHYDRAMINE HCL 12.5 MG/5ML PO ELIX: 12.5000 mg | ORAL_SOLUTION | ORAL | Status: DC | PRN

## 2019-01-25 MED ORDER — METOCLOPRAMIDE HCL 5 MG PO TABS: 5.0000 mg | ORAL_TABLET | Freq: Three times a day (TID) | ORAL | Status: DC | PRN

## 2019-01-25 MED ORDER — CELECOXIB 200 MG PO CAPS
200.0000 mg | ORAL_CAPSULE | Freq: Two times a day (BID) | ORAL | Status: DC
  Administered 2019-01-25 – 2019-01-26 (×2): 200 mg via ORAL
  Filled 2019-01-25 (×2): qty 1

## 2019-01-25 MED ORDER — PROPOFOL 500 MG/50ML IV EMUL
INTRAVENOUS | Status: DC | PRN
  Administered 2019-01-25: 75 ug/kg/min via INTRAVENOUS

## 2019-01-25 MED ORDER — TRANEXAMIC ACID-NACL 1000-0.7 MG/100ML-% IV SOLN
1000.0000 mg | Freq: Once | INTRAVENOUS | Status: AC
  Administered 2019-01-25: 1000 mg via INTRAVENOUS
  Filled 2019-01-25: qty 100

## 2019-01-25 MED ORDER — CEFAZOLIN SODIUM-DEXTROSE 2-4 GM/100ML-% IV SOLN
2.0000 g | Freq: Four times a day (QID) | INTRAVENOUS | Status: AC
  Administered 2019-01-25 – 2019-01-26 (×2): 2 g via INTRAVENOUS
  Filled 2019-01-25 (×2): qty 100

## 2019-01-25 MED ORDER — DEXAMETHASONE SODIUM PHOSPHATE 10 MG/ML IJ SOLN: 10.0000 mg | Freq: Once | INTRAMUSCULAR | Status: DC

## 2019-01-25 MED ORDER — ONDANSETRON HCL 4 MG/2ML IJ SOLN: 4.0000 mg | Freq: Four times a day (QID) | INTRAMUSCULAR | Status: DC | PRN

## 2019-01-25 MED ORDER — ONDANSETRON HCL 4 MG/2ML IJ SOLN
INTRAMUSCULAR | Status: DC | PRN
  Administered 2019-01-25: 4 mg via INTRAVENOUS

## 2019-01-25 MED ORDER — PHENYLEPHRINE HCL (PRESSORS) 10 MG/ML IV SOLN
INTRAVENOUS | Status: DC | PRN
  Administered 2019-01-25 (×3): 80 ug via INTRAVENOUS

## 2019-01-25 MED ORDER — FENTANYL CITRATE (PF) 100 MCG/2ML IJ SOLN
INTRAMUSCULAR | Status: AC
  Filled 2019-01-25: qty 2

## 2019-01-25 MED ORDER — CHLORHEXIDINE GLUCONATE 4 % EX LIQD: 60.0000 mL | Freq: Once | CUTANEOUS | Status: DC

## 2019-01-25 MED ORDER — SODIUM CHLORIDE 0.9 % IR SOLN
Status: DC | PRN
  Administered 2019-01-25: 1000 mL

## 2019-01-25 MED ORDER — METOCLOPRAMIDE HCL 5 MG/ML IJ SOLN: 5.0000 mg | Freq: Three times a day (TID) | INTRAMUSCULAR | Status: DC | PRN

## 2019-01-25 MED ORDER — KETOROLAC TROMETHAMINE 30 MG/ML IJ SOLN
INTRAMUSCULAR | Status: DC | PRN
  Administered 2019-01-25: 30 mg

## 2019-01-25 MED ORDER — ACETAMINOPHEN 10 MG/ML IV SOLN: 1000.0000 mg | Freq: Once | INTRAVENOUS | Status: DC | PRN

## 2019-01-25 MED ORDER — ALUM & MAG HYDROXIDE-SIMETH 200-200-20 MG/5ML PO SUSP: 15.0000 mL | ORAL | Status: DC | PRN

## 2019-01-25 MED ORDER — SODIUM CHLORIDE 0.9 % IV SOLN
INTRAVENOUS | Status: DC
  Administered 2019-01-25: 22:00:00 via INTRAVENOUS

## 2019-01-25 MED ORDER — SODIUM CHLORIDE (PF) 0.9 % IJ SOLN
INTRAMUSCULAR | Status: AC
  Filled 2019-01-25: qty 50

## 2019-01-25 MED ORDER — PROPOFOL 10 MG/ML IV BOLUS
INTRAVENOUS | Status: AC
  Filled 2019-01-25: qty 40

## 2019-01-25 MED ORDER — METHOCARBAMOL 500 MG IVPB - SIMPLE MED
500.0000 mg | Freq: Four times a day (QID) | INTRAVENOUS | Status: DC | PRN
  Administered 2019-01-25: 500 mg via INTRAVENOUS
  Filled 2019-01-25: qty 500

## 2019-01-25 MED ORDER — OXYCODONE HCL 5 MG PO TABS: 5.0000 mg | ORAL_TABLET | Freq: Once | ORAL | Status: DC | PRN

## 2019-01-25 MED ORDER — PROPOFOL 500 MG/50ML IV EMUL
INTRAVENOUS | Status: DC | PRN
  Administered 2019-01-25: 30 mg via INTRAVENOUS

## 2019-01-25 MED ORDER — PROPOFOL 10 MG/ML IV BOLUS
INTRAVENOUS | Status: AC
  Filled 2019-01-25: qty 60

## 2019-01-25 MED ORDER — TRANEXAMIC ACID-NACL 1000-0.7 MG/100ML-% IV SOLN
1000.0000 mg | INTRAVENOUS | Status: AC
  Administered 2019-01-25: 1000 mg via INTRAVENOUS
  Filled 2019-01-25: qty 100

## 2019-01-25 MED ORDER — FERROUS SULFATE 325 (65 FE) MG PO TABS
325.0000 mg | ORAL_TABLET | Freq: Two times a day (BID) | ORAL | Status: DC
  Administered 2019-01-25 – 2019-01-26 (×2): 325 mg via ORAL
  Filled 2019-01-25 (×2): qty 1

## 2019-01-25 SURGICAL SUPPLY — 61 items
ATTUNE MED ANAT PAT 38 KNEE (Knees) ×2 IMPLANT
ATTUNE MED ANAT PAT 38MM KNEE (Knees) ×1 IMPLANT
ATTUNE PS FEM RT SZ 6 CEM KNEE (Femur) ×3 IMPLANT
ATTUNE PSRP INSR SZ6 6 KNEE (Insert) ×2 IMPLANT
ATTUNE PSRP INSR SZ6 6MM KNEE (Insert) ×1 IMPLANT
BAG ZIPLOCK 12X15 (MISCELLANEOUS) IMPLANT
BASE TIBIA ATTUNE KNEE SYS SZ6 (Knees) ×1 IMPLANT
BLADE SAW SGTL 11.0X1.19X90.0M (BLADE) IMPLANT
BLADE SAW SGTL 13.0X1.19X90.0M (BLADE) ×3 IMPLANT
BLADE SURG SZ10 CARB STEEL (BLADE) ×6 IMPLANT
BNDG ELASTIC 6X5.8 VLCR STR LF (GAUZE/BANDAGES/DRESSINGS) ×3 IMPLANT
BOWL SMART MIX CTS (DISPOSABLE) ×3 IMPLANT
CEMENT HV SMART SET (Cement) ×6 IMPLANT
COVER SURGICAL LIGHT HANDLE (MISCELLANEOUS) ×3 IMPLANT
COVER WAND RF STERILE (DRAPES) IMPLANT
CUFF TOURN SGL QUICK 34 (TOURNIQUET CUFF) ×2
CUFF TRNQT CYL 34X4.125X (TOURNIQUET CUFF) ×1 IMPLANT
DECANTER SPIKE VIAL GLASS SM (MISCELLANEOUS) ×6 IMPLANT
DERMABOND ADVANCED (GAUZE/BANDAGES/DRESSINGS) ×2
DERMABOND ADVANCED .7 DNX12 (GAUZE/BANDAGES/DRESSINGS) ×1 IMPLANT
DRAPE U-SHAPE 47X51 STRL (DRAPES) ×3 IMPLANT
DRESSING AQUACEL AG SP 3.5X10 (GAUZE/BANDAGES/DRESSINGS) ×1 IMPLANT
DRSG AQUACEL AG SP 3.5X10 (GAUZE/BANDAGES/DRESSINGS) ×3
DURAPREP 26ML APPLICATOR (WOUND CARE) ×6 IMPLANT
ELECT REM PT RETURN 15FT ADLT (MISCELLANEOUS) ×3 IMPLANT
GLOVE BIO SURGEON STRL SZ 6 (GLOVE) ×3 IMPLANT
GLOVE BIOGEL PI IND STRL 6.5 (GLOVE) ×1 IMPLANT
GLOVE BIOGEL PI IND STRL 7.5 (GLOVE) ×1 IMPLANT
GLOVE BIOGEL PI IND STRL 8.5 (GLOVE) ×1 IMPLANT
GLOVE BIOGEL PI INDICATOR 6.5 (GLOVE) ×2
GLOVE BIOGEL PI INDICATOR 7.5 (GLOVE) ×2
GLOVE BIOGEL PI INDICATOR 8.5 (GLOVE) ×2
GLOVE ECLIPSE 8.0 STRL XLNG CF (GLOVE) ×3 IMPLANT
GLOVE ORTHO TXT STRL SZ7.5 (GLOVE) ×3 IMPLANT
GOWN STRL REUS W/ TWL LRG LVL3 (GOWN DISPOSABLE) ×1 IMPLANT
GOWN STRL REUS W/TWL 2XL LVL3 (GOWN DISPOSABLE) ×3 IMPLANT
GOWN STRL REUS W/TWL LRG LVL3 (GOWN DISPOSABLE) ×5 IMPLANT
HANDPIECE INTERPULSE COAX TIP (DISPOSABLE) ×2
HOLDER FOLEY CATH W/STRAP (MISCELLANEOUS) ×3 IMPLANT
KIT TURNOVER KIT A (KITS) IMPLANT
MANIFOLD NEPTUNE II (INSTRUMENTS) ×3 IMPLANT
NDL SAFETY ECLIPSE 18X1.5 (NEEDLE) ×1 IMPLANT
NEEDLE HYPO 18GX1.5 SHARP (NEEDLE) ×2
NS IRRIG 1000ML POUR BTL (IV SOLUTION) ×3 IMPLANT
PACK TOTAL KNEE CUSTOM (KITS) ×3 IMPLANT
PIN FIX SIGMA LCS THRD HI (PIN) ×6 IMPLANT
PIN THREADED HEADED SIGMA (PIN) ×3 IMPLANT
PROTECTOR NERVE ULNAR (MISCELLANEOUS) ×3 IMPLANT
SET HNDPC FAN SPRY TIP SCT (DISPOSABLE) ×1 IMPLANT
SET PAD KNEE POSITIONER (MISCELLANEOUS) ×3 IMPLANT
SUT MNCRL AB 4-0 PS2 18 (SUTURE) ×3 IMPLANT
SUT STRATAFIX PDS+ 0 24IN (SUTURE) ×3 IMPLANT
SUT VIC AB 1 CT1 36 (SUTURE) ×3 IMPLANT
SUT VIC AB 2-0 CT1 27 (SUTURE) ×6
SUT VIC AB 2-0 CT1 TAPERPNT 27 (SUTURE) ×3 IMPLANT
SYR 3ML LL SCALE MARK (SYRINGE) ×3 IMPLANT
TIBIA ATTUNE KNEE SYS BASE SZ6 (Knees) ×3 IMPLANT
TRAY FOLEY MTR SLVR 16FR STAT (SET/KITS/TRAYS/PACK) ×3 IMPLANT
WATER STERILE IRR 1000ML POUR (IV SOLUTION) ×6 IMPLANT
WRAP KNEE MAXI GEL POST OP (GAUZE/BANDAGES/DRESSINGS) ×3 IMPLANT
YANKAUER SUCT BULB TIP 10FT TU (MISCELLANEOUS) ×3 IMPLANT

## 2019-01-25 NOTE — Progress Notes (Signed)
AssistedDr. Moser with right, ultrasound guided, adductor canal block. Side rails up, monitors on throughout procedure. See vital signs in flow sheet. Tolerated Procedure well.  

## 2019-01-25 NOTE — Op Note (Signed)
NAME:  Fernando Riley                      MEDICAL RECORD NO.:  [id number]                             FACILITY:  [facility]      PHYSICIAN:  Pietro Cassis. Alvan Dame, M.D.  DATE OF BIRTH:  19-Jan-1951      DATE OF PROCEDURE:  01/25/2019                                     OPERATIVE REPORT         PREOPERATIVE DIAGNOSIS:  Right knee osteoarthritis.      POSTOPERATIVE DIAGNOSIS:  Right knee osteoarthritis.      FINDINGS:  The patient was noted to have complete loss of cartilage and   bone-on-bone arthritis with associated osteophytes in the medial and patellofemoral compartments of   the knee with a significant synovitis and associated effusion.  The patient had failed months of conservative treatment including medications, injection therapy, activity modification.     PROCEDURE:  Right total knee replacement.      COMPONENTS USED:  DePuy Attune rotating platform posterior stabilized knee   system, a size 6 femur, 6 tibia, size 6 mm PS AOX insert, and 38 anatomic patellar   button.      SURGEON:  Pietro Cassis. Alvan Dame, M.D.      ASSISTANT:  Danae Orleans, PA-C.      ANESTHESIA:  Regional and Spinal.      SPECIMENS:  None.      COMPLICATION:  None.      DRAINS:  None.  EBL: <50cc      TOURNIQUET TIME:   Total Tourniquet Time Documented: Thigh (Right) - 25 minutes Total: Thigh (Right) - 25 minutes      The patient was stable to the recovery room.      INDICATION FOR PROCEDURE:  ANREW Riley is a 68 y.o. male patient of   mine.  The patient had been seen, evaluated, and treated for months conservatively in the   office with medication, activity modification, and injections.  The patient had   radiographic changes of bone-on-bone arthritis with endplate sclerosis and osteophytes noted.  Based on the radiographic changes and failed conservative measures, the patient   decided to proceed with definitive treatment, total knee replacement.  Risks of infection, DVT, component failure, need for  revision surgery, neurovascular injury were reviewed in the office setting.  The postop course was reviewed stressing the efforts to maximize post-operative satisfaction and function.  Consent was obtained for benefit of pain   relief.      PROCEDURE IN DETAIL:  The patient was brought to the operative theater.   Once adequate anesthesia, preoperative antibiotics, 2 gm of Ancef,1 gm of Tranexamic Acid, and 10 mg of Decadron administered, the patient was positioned supine with a right thigh tourniquet placed.  The  right lower extremity was prepped and draped in sterile fashion.  A time-   out was performed identifying the patient, planned procedure, and the appropriate extremity.      The right lower extremity was placed in the [facility] leg holder.  The leg was   exsanguinated, tourniquet elevated to 250 mmHg.  A midline incision was   made followed by  median parapatellar arthrotomy.  Following initial   exposure, attention was first directed to the patella.  Precut   measurement was noted to be 23-24 mm.  I resected down to 14 mm and used a   38 anatomic patellar button to restore patellar height as well as cover the cut surface.      The lug holes were drilled and a metal shim was placed to protect the   patella from retractors and saw blade during the procedure.      At this point, attention was now directed to the femur.  The femoral   canal was opened with a drill, irrigated to try to prevent fat emboli.  An   intramedullary rod was passed at 5 degrees valgus, 9 mm of bone was   resected off the distal femur.  Following this resection, the tibia was   subluxated anteriorly.  Using the extramedullary guide, 2 mm of bone was resected off   the proximal medial tibia.  We confirmed the gap would be   stable medially and laterally with a size 5 spacer block as well as confirmed that the tibial cut was perpendicular in the coronal plane, checking with an alignment rod.      Once this was done,  I sized the femur to be a size 6 in the anterior-   posterior dimension, chose a standard component based on medial and   lateral dimension.  The size 6 rotation block was then pinned in   position anterior referenced using the C-clamp to set rotation.  The   anterior, posterior, and  chamfer cuts were made without difficulty nor   notching making certain that I was along the anterior cortex to help   with flexion gap stability.      The final box cut was made off the lateral aspect of distal femur.      At this point, the tibia was sized to be a size 6.  The size 6 tray was   then pinned in position through the medial third of the tubercle,   drilled, and keel punched.  Trial reduction was now carried with a 6 femur,  6 tibia, a size 6 mm PS insert, and the 38 anatomic patella botton.  The knee was brought to full extension with good flexion stability with the patella   tracking through the trochlea without application of pressure.  Given   all these findings the trial components removed.  Final components were   opened and cement was mixed.  The knee was irrigated with normal saline solution and pulse lavage.  The synovial lining was   then injected with 30 cc of 0.25% Marcaine with epinephrine, 1 cc of Toradol and 30 cc of NS for a total of 61 cc.     Final implants were then cemented onto cleaned and dried cut surfaces of bone with the knee brought to extension with a size 6 mm PS trial insert.      Once the cement had fully cured, excess cement was removed   throughout the knee.  I confirmed that I was satisfied with the range of   motion and stability, and the final size 6 mm PS AOX insert was chosen.  It was   placed into the knee.      The tourniquet had been let down at 25 minutes.  No significant   hemostasis was required.  The extensor mechanism was then reapproximated using #1 Vicryl and #1 Stratafix  sutures with the knee   in flexion.  The   remaining wound was closed with  2-0 Vicryl and running 4-0 Monocryl.   The knee was cleaned, dried, dressed sterilely using Dermabond and   Aquacel dressing.  The patient was then   brought to recovery room in stable condition, tolerating the procedure   well.   Please note that Physician Assistant, Danae Orleans, PA-C was present for the entirety of the case, and was utilized for pre-operative positioning, peri-operative retractor management, general facilitation of the procedure and for primary wound closure at the end of the case.              Pietro Cassis Alvan Dame, M.D.    01/25/2019 2:15 PM

## 2019-01-25 NOTE — Discharge Instructions (Signed)

## 2019-01-25 NOTE — Interval H&P Note (Signed)
History and Physical Interval Note:  01/25/2019 11:17 AM  Fernando Riley  has presented today for surgery, with the diagnosis of Right knee osteoarthritis.  The various methods of treatment have been discussed with the patient and family. After consideration of risks, benefits and other options for treatment, the patient has consented to  Procedure(s) with comments: TOTAL KNEE ARTHROPLASTY (Right) - 70 mins as a surgical intervention.  The patient's history has been reviewed, patient examined, no change in status, stable for surgery.  I have reviewed the patient's chart and labs.  Questions were answered to the patient's satisfaction.     [name]

## 2019-01-25 NOTE — Transfer of Care (Signed)
Immediate Anesthesia Transfer of Care Note  Patient: Fernando Riley  Procedure(s) Performed: TOTAL KNEE ARTHROPLASTY (Right Knee)  Patient Location: PACU  Anesthesia Type:Spinal  Level of Consciousness: sedated, patient cooperative and responds to stimulation  Airway & Oxygen Therapy: Patient Spontanous Breathing and Patient connected to face mask oxygen  Post-op Assessment: Report given to RN and Post -op Vital signs reviewed and stable  Post vital signs: Reviewed and stable  Last Vitals:  Vitals Value Taken Time  BP    Temp    Pulse 51 01/25/19 1433  Resp 13 01/25/19 1433  SpO2 98 % 01/25/19 1433  Vitals shown include unvalidated device data.  Last Pain:  Vitals:   01/25/19 1204  TempSrc:   PainSc: 0-No pain         Complications: No apparent anesthesia complications

## 2019-01-25 NOTE — Anesthesia Preprocedure Evaluation (Addendum)
Anesthesia Evaluation  Patient identified by MRN, date of birth, ID band Patient awake    Reviewed: Allergy & Precautions, NPO status , Patient's Chart, lab work & pertinent test results  History of Anesthesia Complications Negative for: history of anesthetic complications  Airway Mallampati: II  TM Distance: >3 FB Neck ROM: Full    Dental  (+) Dental Advisory Given   Pulmonary neg shortness of breath, sleep apnea , neg recent URI,    breath sounds clear to auscultation       Cardiovascular hypertension, Pt. on medications  Rhythm:Regular     Neuro/Psych negative neurological ROS  negative psych ROS   GI/Hepatic negative GI ROS, Neg liver ROS,   Endo/Other  Morbid obesity  Renal/GU negative Renal ROS     Musculoskeletal  (+) Arthritis ,   Abdominal   Peds  Hematology negative hematology ROS (+) plt 229   Anesthesia Other Findings   Reproductive/Obstetrics                            Anesthesia Physical Anesthesia Plan  ASA: II  Anesthesia Plan: MAC, Spinal and Regional   Post-op Pain Management:    Induction:   PONV Risk Score and Plan: 1 and Treatment may vary due to age or medical condition and Propofol infusion  Airway Management Planned: Nasal Cannula  Additional Equipment: None  Intra-op Plan:   Post-operative Plan:   Informed Consent: I have reviewed the patients History and Physical, chart, labs and discussed the procedure including the risks, benefits and alternatives for the proposed anesthesia with the patient or authorized representative who has indicated his/her understanding and acceptance.     Dental advisory given  Plan Discussed with: CRNA and Surgeon  Anesthesia Plan Comments:         Anesthesia Quick Evaluation

## 2019-01-25 NOTE — Anesthesia Procedure Notes (Signed)
Spinal  Start time: 01/25/2019 12:40 PM End time: 01/25/2019 12:46 PM Staffing Resident/CRNA: Gean Maidens, CRNA Performed: resident/CRNA  Preanesthetic Checklist Completed: patient identified, site marked, surgical consent, pre-op evaluation, timeout performed, IV checked, risks and benefits discussed and monitors and equipment checked Spinal Block Patient position: sitting Prep: DuraPrep Patient monitoring: heart rate, continuous pulse ox and blood pressure Approach: midline Location: L3-4 Injection technique: single-shot Needle Needle type: Pencan  Needle length: 9 cm Needle insertion depth: 7 cm Additional Notes Pt sitting position, sterile prep/drape, negative paresthesia/heme

## 2019-01-25 NOTE — Anesthesia Procedure Notes (Signed)
Anesthesia Regional Block: Adductor canal block   Pre-Anesthetic Checklist: ,, timeout performed, Correct Patient, Correct Site, Correct Laterality, Correct Procedure, Correct Position, site marked, Risks and benefits discussed,  Surgical consent,  Pre-op evaluation,  At surgeon's request and post-op pain management  Laterality: Right and Lower  Prep: chloraprep       Needles:  Injection technique: Single-shot     Needle Length: 9cm  Needle Gauge: 22     Additional Needles: Arrow StimuQuik ECHO Echogenic Stimulating PNB Needle  Procedures:,,,, ultrasound used (permanent image in chart),,,,  Narrative:  Start time: 01/25/2019 11:59 AM End time: 01/25/2019 12:05 PM Injection made incrementally with aspirations every 5 mL.  Performed by: Personally  Anesthesiologist: Oleta Mouse, MD

## 2019-01-26 ENCOUNTER — Encounter (HOSPITAL_COMMUNITY): Payer: Self-pay | Admitting: Orthopedic Surgery

## 2019-01-26 DIAGNOSIS — E669 Obesity, unspecified: Secondary | ICD-10-CM | POA: Diagnosis present

## 2019-01-26 DIAGNOSIS — M1711 Unilateral primary osteoarthritis, right knee: Secondary | ICD-10-CM | POA: Diagnosis not present

## 2019-01-26 LAB — CBC
HCT: 42.4 % (ref 39.0–52.0)
Hemoglobin: 13.5 g/dL (ref 13.0–17.0)
MCH: 31.5 pg (ref 26.0–34.0)
MCHC: 31.8 g/dL (ref 30.0–36.0)
MCV: 98.8 fL (ref 80.0–100.0)
Platelets: 232 10*3/uL (ref 150–400)
RBC: 4.29 MIL/uL (ref 4.22–5.81)
RDW: 12.5 % (ref 11.5–15.5)
WBC: 12.6 10*3/uL — ABNORMAL HIGH (ref 4.0–10.5)
nRBC: 0 % (ref 0.0–0.2)

## 2019-01-26 LAB — BASIC METABOLIC PANEL
Anion gap: 8 (ref 5–15)
BUN: 19 mg/dL (ref 8–23)
CO2: 24 mmol/L (ref 22–32)
Calcium: 8.4 mg/dL — ABNORMAL LOW (ref 8.9–10.3)
Chloride: 104 mmol/L (ref 98–111)
Creatinine, Ser: 1.03 mg/dL (ref 0.61–1.24)
GFR calc Af Amer: >60 mL/min (ref >60)
GFR calc non Af Amer: >60 mL/min (ref >60)
Glucose, Bld: 176 mg/dL — ABNORMAL HIGH (ref 70–99)
Potassium: 4.3 mmol/L (ref 3.5–5.1)
Sodium: 136 mmol/L (ref 135–145)

## 2019-01-26 MED ORDER — POLYETHYLENE GLYCOL 3350 17 G PO PACK: 17.0000 g | PACK | Freq: Two times a day (BID) | ORAL | 0 refills | Status: DC

## 2019-01-26 MED ORDER — ASPIRIN 81 MG PO CHEW: 81.0000 mg | CHEWABLE_TABLET | Freq: Two times a day (BID) | ORAL | 0 refills | Status: AC

## 2019-01-26 MED ORDER — FERROUS SULFATE 325 (65 FE) MG PO TABS: 325.0000 mg | ORAL_TABLET | Freq: Three times a day (TID) | ORAL | 0 refills | Status: DC

## 2019-01-26 MED ORDER — DOCUSATE SODIUM 100 MG PO CAPS: 100.0000 mg | ORAL_CAPSULE | Freq: Two times a day (BID) | ORAL | 0 refills | Status: DC

## 2019-01-26 MED ORDER — METHOCARBAMOL 500 MG PO TABS: 500.0000 mg | ORAL_TABLET | Freq: Four times a day (QID) | ORAL | 0 refills | Status: DC | PRN

## 2019-01-26 MED ORDER — HYDROCODONE-ACETAMINOPHEN 7.5-325 MG PO TABS: 1.0000 | ORAL_TABLET | ORAL | 0 refills | Status: DC | PRN

## 2019-01-26 NOTE — Progress Notes (Signed)
Physical Therapy Treatment Patient Details Name: Fernando Riley MRN: [id number] DOB: 1950-12-16 Today's Date: 01/26/2019    History of Present Illness 68 yo male s/p R TKR on 01/25/19. PMH includes OSA, HTN, melanoma, fatty liver, venous stasis.    PT Comments    Pt with improved ambulation distance and steadiness this session, progressing to supervision level of assist with smooth, step-over-step gait. Pt proficiently navigated stair training and performed TKR exercises well. Exercise handout administered, reviewed, and practiced with pt. Pt with no further questions, pt to d/c home with wife today.    Follow Up Recommendations  Follow surgeon's recommendation for DC plan and follow-up therapies;Supervision for mobility/OOB(OPPT)     Equipment Recommendations  None recommended by PT    Recommendations for Other Services       Precautions / Restrictions Precautions Precautions: Fall Restrictions Weight Bearing Restrictions: No Other Position/Activity Restrictions: WBAT    Mobility  Bed Mobility Overal bed mobility: Needs Assistance Bed Mobility: Supine to Sit     Supine to sit: HOB elevated;Min guard     General bed mobility comments: pt up in chair upon PT arrival to room, left in chair upon PT exit.  Transfers Overall transfer level: Needs assistance Equipment used: Rolling walker (2 wheeled) Transfers: Sit to/from Stand Sit to Stand: Supervision         General transfer comment: supervision for safety, no VC for hand placement required.  Ambulation/Gait Ambulation/Gait assistance: Supervision Gait Distance (Feet): 150 Feet(2x150 ft) Assistive device: Rolling walker (2 wheeled) Gait Pattern/deviations: Step-to pattern;Step-through pattern;Decreased stride length Gait velocity: decr   General Gait Details: supervision for safety, verbal cuing for upright posture x1   Stairs Stairs: Yes Stairs assistance: Min guard Stair Management: One rail  Left Number of Stairs: 10 General stair comments: min guard for safety, verbal cuing for sequencing (up with good leg, down with bad leg) and step-to gait. Pt also completed x3 steps with R handrail to simulate front steps to enter house.   Wheelchair Mobility    Modified Rankin (Stroke Patients Only)       Balance Overall balance assessment: Mild deficits observed, not formally tested                                          Cognition Arousal/Alertness: Awake/alert Behavior During Therapy: WFL for tasks assessed/performed Overall Cognitive Status: Within Functional Limits for tasks assessed                                        Exercises Total Joint Exercises Ankle Circles/Pumps: AROM;Both;10 reps;Seated Quad Sets: AROM;Right;10 reps;Seated Towel Squeeze: AROM;Both;10 reps;Seated Short Arc Quad: AROM;Right;10 reps;Seated Heel Slides: AAROM;Right;5 reps;Seated Hip ABduction/ADduction: AROM;Right;10 reps;Seated Straight Leg Raises: AROM;Right;10 reps;Seated Knee Flexion: AROM;Right;10 reps;Seated(with towel under foot to aide in sliding)    General Comments        Pertinent Vitals/Pain Pain Assessment: 0-10 Pain Score: 5  Pain Location: R knee Pain Descriptors / Indicators: Sore Pain Intervention(s): Limited activity within patient's tolerance;Monitored during session;Premedicated before session;Repositioned    Home Living Family/patient expects to be discharged to:: Private residence Living Arrangements: Spouse/significant other Available Help at Discharge: Family Type of Home: House Home Access: Stairs to enter Entrance Stairs-Rails: Right Home Layout: Two level;Bed/bath upstairs Home Equipment: Walker - 2 wheels;Bedside  commode      Prior Function Level of Independence: Independent          PT Goals (current goals can now be found in the care plan section) Acute Rehab PT Goals PT Goal Formulation: With patient Time  For Goal Achievement: 02/02/19 Potential to Achieve Goals: Good Progress towards PT goals: Progressing toward goals    Frequency    7X/week      PT Plan      Co-evaluation              AM-PAC PT "6 Clicks" Mobility   Outcome Measure  Help needed turning from your back to your side while in a flat bed without using bedrails?: A Little Help needed moving from lying on your back to sitting on the side of a flat bed without using bedrails?: A Little Help needed moving to and from a bed to a chair (including a wheelchair)?: None Help needed standing up from a chair using your arms (e.g., wheelchair or bedside chair)?: None Help needed to walk in hospital room?: None Help needed climbing 3-5 steps with a railing? : A Little 6 Click Score: 21    End of Session Equipment Utilized During Treatment: Gait belt Activity Tolerance: Patient tolerated treatment well Patient left: in chair;with call bell/phone within reach(pt verbally agrees to press call button and wait for assist prior to mobilizing back to bed) Nurse Communication: Mobility status PT Visit Diagnosis: Other abnormalities of gait and mobility (R26.89);Difficulty in walking, not elsewhere classified (R26.2)     Time: [id number] PT Time Calculation (min) (ACUTE ONLY): 24 min  Charges:  $Gait Training: 8-22 mins $Therapeutic Exercise: 8-22 mins                     Fernando Riley, PT Acute Rehabilitation Services Pager [phone]  Office [phone]   Fernando Riley 01/26/2019, 2:55 PM

## 2019-01-26 NOTE — Plan of Care (Signed)
Patient discharged home in stable condition 

## 2019-01-26 NOTE — Progress Notes (Signed)
Vital stable, foley removed. Patient voided 225 this a.m. post-foley removal.

## 2019-01-26 NOTE — Evaluation (Signed)
Physical Therapy Evaluation Patient Details Name: Fernando Riley MRN: [id number] DOB: 07-Sep-1950 Today's Date: 01/26/2019   History of Present Illness  68 yo male s/p R TKR on 01/25/19. PMH includes OSA, HTN, melanoma, fatty liver, venous stasis.  Clinical Impression  Pt presents with R knee pain, decreased R knee ROM, increased time and effort to perform mobility tasks, and decreased activity tolerance. Pt to benefit from acute PT to address deficits. Pt ambulated hallway distance with RW with min guard assist, verbal cuing for form and safety provided throughout. PT to see pt for pm session to address stair navigation and to continue TKR exercise program. PT to progress mobility as tolerated, and will continue to follow acutely.      Follow Up Recommendations Follow surgeon's recommendation for DC plan and follow-up therapies;Supervision for mobility/OOB(OPPT)    Equipment Recommendations  None recommended by PT    Recommendations for Other Services       Precautions / Restrictions Precautions Precautions: Fall Restrictions Weight Bearing Restrictions: No Other Position/Activity Restrictions: WBAT      Mobility  Bed Mobility Overal bed mobility: Needs Assistance Bed Mobility: Supine to Sit     Supine to sit: HOB elevated;Min guard     General bed mobility comments: Min guard for safety, verbal cuing for sequencing to EOB.  Transfers Overall transfer level: Needs assistance Equipment used: Rolling walker (2 wheeled) Transfers: Sit to/from Stand Sit to Stand: Min guard;From elevated surface         General transfer comment: Min guard for safety, verbal cuing for hand placement when rising from bed.  Ambulation/Gait Ambulation/Gait assistance: Min guard Gait Distance (Feet): 200 Feet Assistive device: Rolling walker (2 wheeled) Gait Pattern/deviations: Step-to pattern;Step-through pattern;Decreased stride length Gait velocity: decr   General Gait Details: Min  guard for safety, verbal cuing for sequencing with pt progressing to step-through gait, placement in RW, upright posture.  Stairs            Wheelchair Mobility    Modified Rankin (Stroke Patients Only)       Balance Overall balance assessment: Mild deficits observed, not formally tested                                           Pertinent Vitals/Pain Pain Assessment: 0-10 Pain Score: 4  Pain Location: R knee Pain Descriptors / Indicators: Sore Pain Intervention(s): Limited activity within patient's tolerance;Monitored during session;Premedicated before session;Repositioned;Ice applied    Home Living Family/patient expects to be discharged to:: Private residence Living Arrangements: Spouse/significant other Available Help at Discharge: Family Type of Home: House Home Access: Stairs to enter Entrance Stairs-Rails: Right Entrance Stairs-Number of Steps: [place]: Two level;Bed/bath upstairs Home Equipment: [place] - 2 wheels;Bedside commode      Prior Function Level of Independence: Independent               Hand Dominance   Dominant Hand: Right    Extremity/Trunk Assessment   Upper Extremity Assessment Upper Extremity Assessment: Overall WFL for tasks assessed    Lower Extremity Assessment Lower Extremity Assessment: Overall WFL for tasks assessed;RLE deficits/detail RLE Deficits / Details: suspected post-surgical weakness; able to perform ankle pumps, quad set, heel slide to 50* limited by pain, SLR without lift assist or quad lag RLE Sensation: WNL    Cervical / Trunk Assessment Cervical / Trunk Assessment: Normal  Communication  Communication: No difficulties  Cognition Arousal/Alertness: Awake/alert Behavior During Therapy: WFL for tasks assessed/performed Overall Cognitive Status: Within Functional Limits for tasks assessed                                        General Comments      Exercises  Total Joint Exercises Ankle Circles/Pumps: AROM;Both;10 reps;Seated Quad Sets: AROM;Right;10 reps;Seated Heel Slides: AAROM;Right;5 reps;Seated Straight Leg Raises: AROM;Right;10 reps;Seated   Assessment/Plan    PT Assessment Patient needs continued PT services  PT Problem List Decreased strength;Decreased mobility;Decreased range of motion;Decreased activity tolerance;Decreased balance;Pain;Decreased knowledge of use of DME       PT Treatment Interventions DME instruction;Therapeutic activities;Gait training;Therapeutic exercise;Patient/family education;Balance training;Stair training;Functional mobility training    PT Goals (Current goals can be found in the Care Plan section)  Acute Rehab PT Goals PT Goal Formulation: With patient Time For Goal Achievement: 02/02/19 Potential to Achieve Goals: Good    Frequency 7X/week   Barriers to discharge        Co-evaluation               AM-PAC PT "6 Clicks" Mobility  Outcome Measure Help needed turning from your back to your side while in a flat bed without using bedrails?: A Little Help needed moving from lying on your back to sitting on the side of a flat bed without using bedrails?: A Little Help needed moving to and from a bed to a chair (including a wheelchair)?: A Little Help needed standing up from a chair using your arms (e.g., wheelchair or bedside chair)?: A Little Help needed to walk in hospital room?: A Little Help needed climbing 3-5 steps with a railing? : A Little 6 Click Score: 18    End of Session Equipment Utilized During Treatment: Gait belt Activity Tolerance: Patient tolerated treatment well;Patient limited by pain Patient left: in chair;with call bell/phone within reach(pt verbally agrees to press call button and wait for assist prior to mobilizing back to bed) Nurse Communication: Mobility status PT Visit Diagnosis: Other abnormalities of gait and mobility (R26.89);Difficulty in walking, not elsewhere  classified (R26.2)    Time: [id number] PT Time Calculation (min) (ACUTE ONLY): 27 min   Charges:   PT Evaluation $PT Eval Low Complexity: 1 Low PT Treatments $Gait Training: 8-22 mins      Fernando Riley, PT Acute Rehabilitation Services Pager [phone]  Office [phone]   [name] D [name] 01/26/2019, 12:19 PM

## 2019-01-26 NOTE — Progress Notes (Signed)
Patient has DME and 3 in 1 and RW.

## 2019-01-26 NOTE — Progress Notes (Signed)
     Subjective: 1 Day Post-Op Procedure(s) (LRB): TOTAL KNEE ARTHROPLASTY (Right)   Patient reports pain as mild, pain controlled with medication.  No reported events throughout the night.  The procedure, findings and expectations moving forward were discussed with the patient.  Patient feels that he is doing well looking forward to progressing with therapy.  Patient is ready be discharged home, if he does well with therapy.  Patient knows to call with any questions or concerns.  Patient will follow-up in the clinic in 2 weeks.   Patient's anticipated LOS is less than 2 midnights, meeting these requirements: - Lives within 1 hour of care - Has a competent adult at home to recover with post-op recover - NO history of  - Chronic pain requiring opiods  - Diabetes  - Coronary Artery Disease  - Heart failure  - Heart attack  - Stroke  - DVT/VTE  - Cardiac arrhythmia  - Respiratory Failure/COPD  - Renal failure  - Anemia  - Advanced Liver disease       Objective:   VITALS:   Vitals:   01/26/19 0134 01/26/19 0551  BP: 122/65 (!) 141/67  Pulse: 67 69  Resp: 18 18  Temp: (!) 97.5 F (36.4 C) 97.6 F (36.4 C)  SpO2: 97% 97%    Dorsiflexion/Plantar flexion intact Incision: dressing C/D/I No cellulitis present Compartment soft  LABS Recent Labs    01/26/19 0300  HGB 13.5  HCT 42.4  WBC 12.6*  PLT 232    Recent Labs    01/26/19 0300  NA 136  K 4.3  BUN 19  CREATININE 1.03  GLUCOSE 176*     Assessment/Plan: 1 Day Post-Op Procedure(s) (LRB): TOTAL KNEE ARTHROPLASTY (Right) Foley cath DC'd Advance diet Up with therapy D/C IV fluids Discharge home Follow up in 2 weeks at [facility] Follow up with OLIN,Linnea Todisco D in 2 weeks.  Contact information:  [facility] [address] [place] [phone]    Obese (BMI 30-39.9) Estimated body mass index is 34.72 kg/m as calculated from the following:   Height as of  this encounter: 6' (1.829 m).   Weight as of this encounter: 116.1 kg. Patient also counseled that weight may inhibit the healing process Patient counseled that losing weight will help with future health issues       West Pugh. Hrithik Boschee   PAC  01/26/2019, 9:53 AM

## 2019-01-27 NOTE — Discharge Summary (Signed)
Physician Discharge Summary  Patient ID: Fernando Riley MRN: [id number] DOB/AGE: 1950-08-14 68 y.o.  Admit date: 01/25/2019 Discharge date: 01/26/2019   Procedures:  Procedure(s) (LRB): TOTAL KNEE ARTHROPLASTY (Right)  Attending Physician:  Dr. Paralee Cancel   Admission Diagnoses:   Right knee OA / pain  Discharge Diagnoses:  Active Problems:   Status post total right knee replacement   Obese  Past Medical History:  Diagnosis Date  . Arthritis   . Cat allergies   . Fatty liver   . History of kidney stones   . History of migraine   . Hypertension   . Impaired fasting glucose   . Melanoma ([place])    lower legs bilaterally  . OSA (obstructive sleep apnea)    occ use of CPAP  . Peyronie disease   . Pollen allergies   . Reactive airways dysfunction syndrome ([place])   . Venous stasis     HPI:    Fernando Riley, 68 y.o. male, has a history of pain and functional disability in the right knee due to arthritis and has failed non-surgical conservative treatments for greater than 12 weeks to includecorticosteriod injections, viscosupplementation injections and activity modification.  Onset of symptoms was gradual, starting 1 years ago with gradually worsening course since that time. The patient noted prior procedures on the knee to include  arthroscopy and menisectomy on the right knee(s).  Patient currently rates pain in the right knee(s) at 6 out of 10 with activity. Patient has worsening of pain with activity and weight bearing and pain that interferes with activities of daily living.  Patient has evidence of evidence of advanced joint space loss in both knees worse in the medial compartment associated with moderate patellofemoral arthritis. by imaging studies. There is no active infection.  PCP: Mikey Kirschner, MD   Discharged Condition: good  Hospital Course:  Patient underwent the above stated procedure on 01/25/2019. Patient tolerated the procedure well and brought to the  recovery room in good condition and subsequently to the floor.  POD #1 BP: 141/67 ; Pulse: 69 ; Temp: 97.6 F (36.4 C) ; Resp: 18 Patient reports pain as mild, pain controlled with medication.  No reported events throughout the night.  The procedure, findings and expectations moving forward were discussed with the patient.  Patient feels that he is doing well looking forward to progressing with therapy.  Patient is ready be discharged home. Dorsiflexion/plantar flexion intact, incision: dressing C/D/I, no cellulitis present and compartment soft.   LABS  Basename    HGB     13.5  HCT     42.4    Discharge Exam: General appearance: alert, cooperative and no distress Extremities: Homans sign is negative, no sign of DVT, no edema, redness or tenderness in the calves or thighs and no ulcers, gangrene or trophic changes  Disposition:  Home with follow up in 2 weeks   Follow-up Information    Paralee Cancel, MD In 2 weeks.   Specialty: Orthopedic Surgery Contact information: [address] [place] [address] [id number]           Discharge Instructions    Call MD / Call 911   Complete by: As directed    If you experience chest pain or shortness of breath, CALL 911 and be transported to the hospital emergency room.  If you develope a fever above 101 F, pus (white drainage) or increased drainage or redness at the wound, or calf pain, call your  surgeon's office.   Change dressing   Complete by: As directed    Maintain surgical dressing until follow up in the clinic. If the edges start to pull up, may reinforce with tape. If the dressing is no longer working, may remove and cover with gauze and tape, but must keep the area dry and clean.  Call with any questions or concerns.   Constipation Prevention   Complete by: As directed    Drink plenty of fluids.  Prune juice may be helpful.  You may use a stool softener, such as Colace (over the counter) 100 mg twice a day.   Use MiraLax (over the counter) for constipation as needed.   Diet - low sodium heart healthy   Complete by: As directed    Discharge instructions   Complete by: As directed    Maintain surgical dressing until follow up in the clinic. If the edges start to pull up, may reinforce with tape. If the dressing is no longer working, may remove and cover with gauze and tape, but must keep the area dry and clean.  Follow up in 2 weeks at [facility]. Call with any questions or concerns.   Increase activity slowly as tolerated   Complete by: As directed    Weight bearing as tolerated with assist device (walker, cane, etc) as directed, use it as long as suggested by your surgeon or therapist, typically at least 4-6 weeks.   TED hose   Complete by: As directed    Use stockings (TED hose) for 2 weeks on both leg(s).  You may remove them at night for sleeping.      Allergies as of 01/26/2019      Reactions   Other    Cats   Red Dye Hives   Pt's wife reported he gets hives with "food" red dye, but can take tablets that are red or pink      Medication List    STOP taking these medications   aspirin 81 MG tablet Replaced by: aspirin 81 MG chewable tablet   diclofenac sodium 1 % Gel Commonly known as: VOLTAREN     TAKE these medications   aspirin 81 MG chewable tablet Commonly known as: Aspirin Childrens Chew 1 tablet (81 mg total) by mouth 2 (two) times daily. Take for 4 weeks, then resume regular dose. Replaces: aspirin 81 MG tablet   docusate sodium 100 MG capsule Commonly known as: Colace Take 1 capsule (100 mg total) by mouth 2 (two) times daily.   enalapril 10 MG tablet Commonly known as: VASOTEC Take 1 tablet (10 mg total) by mouth daily.   ferrous sulfate 325 (65 FE) MG tablet Commonly known as: FerrouSul Take 1 tablet (325 mg total) by mouth 3 (three) times daily with meals for 14 days.   HYDROcodone-acetaminophen 7.5-325 MG tablet Commonly known as: Norco Take  1-2 tablets by mouth every 4 (four) hours as needed for moderate pain.   methocarbamol 500 MG tablet Commonly known as: Robaxin Take 1 tablet (500 mg total) by mouth every 6 (six) hours as needed for muscle spasms.   polyethylene glycol 17 g packet Commonly known as: MIRALAX / GLYCOLAX Take 17 g by mouth 2 (two) times daily.   TURMERIC PO Take 1,000 mg by mouth daily.            Discharge Care Instructions  (From admission, onward)         Start     Ordered   01/26/19  0000  Change dressing    Comments: Maintain surgical dressing until follow up in the clinic. If the edges start to pull up, may reinforce with tape. If the dressing is no longer working, may remove and cover with gauze and tape, but must keep the area dry and clean.  Call with any questions or concerns.   01/26/19 [id number]           Signed: West Pugh. Sanoe Hazan   PA-C  01/27/2019, 8:42 AM

## 2019-02-03 ENCOUNTER — Encounter (HOSPITAL_COMMUNITY): Payer: Self-pay | Admitting: Orthopedic Surgery

## 2019-02-03 NOTE — Anesthesia Postprocedure Evaluation (Signed)
Anesthesia Post Note  Patient: Fernando Riley  Procedure(s) Performed: TOTAL KNEE ARTHROPLASTY (Right Knee)     Patient location during evaluation: PACU Anesthesia Type: Regional, Spinal and MAC Level of consciousness: awake and alert Pain management: pain level controlled Vital Signs Assessment: post-procedure vital signs reviewed and stable Respiratory status: spontaneous breathing, nonlabored ventilation, respiratory function stable and patient connected to nasal cannula oxygen Cardiovascular status: stable and blood pressure returned to baseline Postop Assessment: no apparent nausea or vomiting and spinal receding Anesthetic complications: no    Last Vitals:  Vitals:   01/26/19 1020 01/26/19 1359  BP: 124/61 140/61  Pulse: 65 70  Resp: 17 17  Temp: 36.6 C 36.7 C  SpO2: 95%     Last Pain:  Vitals:   01/26/19 1359  TempSrc: Oral  PainSc:                  Rodderick Holtzer

## 2019-02-25 ENCOUNTER — Other Ambulatory Visit: Payer: Self-pay | Admitting: Family Medicine

## 2019-03-09 NOTE — H&P (Signed)
TOTAL KNEE ADMISSION H&P  Patient is being admitted for left total knee arthroplasty.  Subjective:  Chief Complaint:   Left knee OA / pain  HPI: Fernando Riley, 68 y.o. male, has a history of pain and functional disability in the left knee due to arthritis and has failed non-surgical conservative treatments for greater than 12 weeks to include NSAID's and/or analgesics, corticosteriod injections, viscosupplementation injections and activity modification.  Onset of symptoms was gradual, starting Aug 24, 2017 with gradually worsening course since that time. The patient noted prior procedures on the knee to include  arthroplasty on the right knee(s).  Patient currently rates pain in the left knee(s) at 6 out of 10 with activity. Patient has night pain, worsening of pain with activity and weight bearing, pain that interferes with activities of daily living, pain with passive range of motion, crepitus and joint swelling.  Patient has evidence of periarticular osteophytes and joint space narrowing by imaging studies.  There is no active infection.  Risks, benefits and expectations were discussed with the patient.  Risks including but not limited to the risk of anesthesia, blood clots, nerve damage, blood vessel damage, failure of the prosthesis, infection and up to and including death.  Patient understand the risks, benefits and expectations and wishes to proceed with surgery.   PCP: Mikey Kirschner, MD  D/C Plans:       Home   Post-op Meds:       Rx given for ASA, Robaxin, Norco, Celebrex, Iron, Colace and MiraLax  Tranexamic Acid:      To be given - IV   Decadron:      Is to be given  FYI:      ASA  Norco  DME:   Pt already has equipment  PT:   OPPT Rx given  Pharm:  Meds sent    Patient Active Problem List   Diagnosis Date Noted  . Obese 01/26/2019  . Status post total right knee replacement 01/25/2019  . Peyronie's disease 09/14/2017  . Viral syndrome 11/16/2015  . Obstructive  sleep apnea 08/20/2015  . Essential hypertension, benign 07/20/2013   Past Medical History:  Diagnosis Date  . Arthritis   . Cat allergies   . Fatty liver   . History of kidney stones   . History of migraine   . Hypertension   . Impaired fasting glucose   . Melanoma ([place])    lower legs bilaterally  . OSA (obstructive sleep apnea)    occ use of CPAP  . Peyronie disease   . Pollen allergies   . Reactive airways dysfunction syndrome ([place])   . Venous stasis     Past Surgical History:  Procedure Laterality Date  . COLONOSCOPY N/A 01/21/2016   Procedure: COLONOSCOPY;  Surgeon: Daneil Dolin, MD;  Location: [facility];  Service: Endoscopy;  Laterality: N/A;  1:00 PM - moved to 12:30 - office notified per Tretha Sciara  . KNEE ARTHROSCOPY Bilateral   . POLYPECTOMY  01/21/2016   Procedure: POLYPECTOMY;  Surgeon: Daneil Dolin, MD;  Location: [facility];  Service: Endoscopy;;  colon  . SHOULDER ARTHROSCOPY Left   . SHOULDER SURGERY Right 2016  . TOTAL KNEE ARTHROPLASTY Right 01/25/2019   Procedure: TOTAL KNEE ARTHROPLASTY;  Surgeon: Paralee Cancel, MD;  Location: [facility];  Service: Orthopedics;  Laterality: Right;  70 mins    No current facility-administered medications for this encounter.    Current Outpatient Medications  Medication Sig Dispense Refill Last  Dose  . docusate sodium (COLACE) 100 MG capsule Take 1 capsule (100 mg total) by mouth 2 (two) times daily. 28 capsule 0   . enalapril (VASOTEC) 10 MG tablet TAKE 1 TABLET EVERY DAY 90 tablet 1   . ferrous sulfate (FERROUSUL) 325 (65 FE) MG tablet Take 1 tablet (325 mg total) by mouth 3 (three) times daily with meals for 14 days. 42 tablet 0   . HYDROcodone-acetaminophen (NORCO) 7.5-325 MG tablet Take 1-2 tablets by mouth every 4 (four) hours as needed for moderate pain. 60 tablet 0   . methocarbamol (ROBAXIN) 500 MG tablet Take 1 tablet (500 mg total) by mouth every 6 (six) hours as needed for muscle spasms. 40 tablet 0   .  polyethylene glycol (MIRALAX / GLYCOLAX) 17 g packet Take 17 g by mouth 2 (two) times daily. 28 packet 0   . TURMERIC PO Take 1,000 mg by mouth daily.    01/17/2019   Allergies  Allergen Reactions  . Other     Cats  . Red Dye Hives    Pt's wife reported he gets hives with "food" red dye, but can take tablets that are red or pink    Social History   Tobacco Use  . Smoking status: Never Smoker  . Smokeless tobacco: Never Used  Substance Use Topics  . Alcohol use: No    Family History  Problem Relation Age of Onset  . Heart disease Father   . Diabetes Brother   . Hyperlipidemia Brother      Review of Systems  Constitutional: Negative.   HENT: Negative.   Eyes: Negative.   Respiratory: Negative.   Cardiovascular: Negative.   Gastrointestinal: Negative.   Genitourinary: Negative.   Musculoskeletal: Positive for joint pain.  Skin: Negative.   Neurological: Positive for headaches.  Endo/Heme/Allergies: Positive for environmental allergies.  Psychiatric/Behavioral: Negative.     Objective:  Physical Exam  Constitutional: He is oriented to person, place, and time. He appears well-developed.  HENT:  Head: Normocephalic.  Eyes: Pupils are equal, round, and reactive to light.  Neck: Neck supple. No JVD present. No tracheal deviation present. No thyromegaly present.  Cardiovascular: Normal rate, regular rhythm and intact distal pulses.  Respiratory: Effort normal and breath sounds normal. No respiratory distress. He has no wheezes.  GI: Soft. There is no abdominal tenderness. There is no guarding.  Musculoskeletal:     Left knee: He exhibits swelling and bony tenderness. He exhibits no ecchymosis, no deformity and no laceration. Tenderness found.  Lymphadenopathy:    He has no cervical adenopathy.  Neurological: He is alert and oriented to person, place, and time.  Skin: Skin is warm and dry.  Psychiatric: He has a normal mood and affect.     Labs:  Estimated body  mass index is 34.72 kg/m as calculated from the following:   Height as of 01/25/19: 6' (1.829 m).   Weight as of 01/25/19: 116.1 kg.   Imaging Review Plain radiographs demonstrate severe degenerative joint disease of the left knee.  The bone quality appears to be good for age and reported activity level.      Assessment/Plan:  End stage arthritis, left knee   The patient history, physical examination, clinical judgment of the provider and imaging studies are consistent with end stage degenerative joint disease of the left knee(s) and total knee arthroplasty is deemed medically necessary. The treatment options including medical management, injection therapy arthroscopy and arthroplasty were discussed at length. The risks and  benefits of total knee arthroplasty were presented and reviewed. The risks due to aseptic loosening, infection, stiffness, patella tracking problems, thromboembolic complications and other imponderables were discussed. The patient acknowledged the explanation, agreed to proceed with the plan and consent was signed. Patient is being admitted for inpatient treatment for surgery, pain control, PT, OT, prophylactic antibiotics, VTE prophylaxis, progressive ambulation and ADL's and discharge planning. The patient is planning to be discharged home.    Patient's anticipated LOS is less than 2 midnights, meeting these requirements: - Lives within 1 hour of care - Has a competent adult at home to recover with post-op recover - NO history of  - Chronic pain requiring opiods  - Diabetes  - Coronary Artery Disease  - Heart failure  - Heart attack  - Stroke  - DVT/VTE  - Cardiac arrhythmia  - Respiratory Failure/COPD  - Renal failure  - Anemia  - Advanced Liver disease     West Pugh. Narda Fundora   PA-C  03/09/2019, 12:47 PM

## 2019-03-17 NOTE — Patient Instructions (Addendum)
DUE TO COVID-19 ONLY ONE VISITOR IS ALLOWED TO COME WITH YOU AND STAY IN THE WAITING ROOM ONLY DURING PRE OP AND PROCEDURE DAY OF SURGERY. THE 1 VISITOR MAY VISIT WITH YOU AFTER SURGERY IN YOUR PRIVATE ROOM DURING VISITING HOURS ONLY!   ONCE YOUR COVID TEST IS COMPLETED, PLEASE BEGIN THE QUARANTINE INSTRUCTIONS AS OUTLINED IN YOUR HANDOUT.                Fernando Riley     Your procedure is scheduled on: Tuesday 03/22/2019   Report to [facility] Main  Entrance    Report to Short Stay at [facility]  AM     Call this number if you have problems the morning of surgery [phone]     Remember: Do not eat food  :After Midnight.     NO SOLID FOOD AFTER MIDNIGHT THE NIGHT PRIOR TO SURGERY. NOTHING BY MOUTH EXCEPT CLEAR LIQUIDS UNTIL 0415 am .     PLEASE FINISH ENSURE DRINK PER SURGEON ORDER  WHICH NEEDS TO BE COMPLETED AT  0415 am .   CLEAR LIQUID DIET   Foods Allowed                                                                     Foods Excluded  Coffee and tea, regular and decaf                             liquids that you cannot  Plain Jell-O any favor except red or purple                                           see through such as: Fruit ices (not with fruit pulp)                                     milk, soups, orange juice  Iced Popsicles                                    All solid food Carbonated beverages, regular and diet                                    Cranberry, grape and apple juices Sports drinks like Gatorade Lightly seasoned clear broth or consume(fat free) Sugar, honey syrup  Sample Menu Breakfast                                Lunch                                     Supper Cranberry juice                    Beef  broth                            Chicken broth Jell-O                                     Grape juice                           Apple juice Coffee or tea                        Jell-O                                      Popsicle                                           Coffee or tea                        Coffee or tea  _____________________________________________________________________    BRUSH YOUR TEETH MORNING OF SURGERY AND RINSE YOUR MOUTH OUT, NO CHEWING GUM CANDY OR MINTS.     Take these medicines the morning of surgery with A SIP OF WATER: none                                 You may not have any metal on your body including hair pins and              piercings  Do not wear jewelry, make-up, lotions, powders or perfumes, deodorant                          Men may shave face and neck.   Do not bring valuables to the hospital. [place].  Contacts, dentures or bridgework may not be worn into surgery.  Leave suitcase in the car. After surgery it may be brought to your room.                  Please read over the following fact sheets you were given: _____________________________________________________________________             [facility] - Preparing for Surgery Before surgery, you can play an important role.  Because skin is not sterile, your skin needs to be as free of germs as possible.  You can reduce the number of germs on your skin by washing with CHG (chlorahexidine gluconate) soap before surgery.  CHG is an antiseptic cleaner which kills germs and bonds with the skin to continue killing germs even after washing. Please DO NOT use if you have an allergy to CHG or antibacterial soaps.  If your skin becomes reddened/irritated stop using the CHG and inform your nurse when you arrive at Short Stay. Do not shave (including legs and underarms) for at least 48 hours prior to the first CHG shower.  You may shave your face/neck. Please  follow these instructions carefully:  1.  Shower with CHG Soap the night before surgery and the  morning of Surgery.  2.  If you choose to wash your hair, wash your hair first as usual with your  normal  shampoo.  3.   After you shampoo, rinse your hair and body thoroughly to remove the  shampoo.                           4.  Use CHG as you would any other liquid soap.  You can apply chg directly  to the skin and wash                       Gently with a scrungie or clean washcloth.  5.  Apply the CHG Soap to your body ONLY FROM THE NECK DOWN.   Do not use on face/ open                           Wound or open sores. Avoid contact with eyes, ears mouth and genitals (private parts).                       Wash face,  Genitals (private parts) with your normal soap.             6.  Wash thoroughly, paying special attention to the area where your surgery  will be performed.  7.  Thoroughly rinse your body with warm water from the neck down.  8.  DO NOT shower/wash with your normal soap after using and rinsing off  the CHG Soap.                9.  Pat yourself dry with a clean towel.            10.  Wear clean pajamas.            11.  Place clean sheets on your bed the night of your first shower and do not  sleep with pets. Day of Surgery : Do not apply any lotions/deodorants the morning of surgery.  Please wear clean clothes to the hospital/surgery center.  FAILURE TO FOLLOW THESE INSTRUCTIONS MAY RESULT IN THE CANCELLATION OF YOUR SURGERY PATIENT SIGNATURE_________________________________  NURSE SIGNATURE__________________________________  ________________________________________________________________________   Fernando Riley  An incentive spirometer is a tool that can help keep your lungs clear and active. This tool measures how well you are filling your lungs with each breath. Taking long deep breaths may help reverse or decrease the chance of developing breathing (pulmonary) problems (especially infection) following:  A long period of time when you are unable to move or be active. BEFORE THE PROCEDURE   If the spirometer includes an indicator to show your best effort, your nurse or respiratory  therapist will set it to a desired goal.  If possible, sit up straight or lean slightly forward. Try not to slouch.  Hold the incentive spirometer in an upright position. INSTRUCTIONS FOR USE  1. Sit on the edge of your bed if possible, or sit up as far as you can in bed or on a chair. 2. Hold the incentive spirometer in an upright position. 3. Breathe out normally. 4. Place the mouthpiece in your mouth and seal your lips tightly around it. 5. Breathe in slowly and as deeply as possible, raising the piston or the ball  toward the top of the column. 6. Hold your breath for 3-5 seconds or for as long as possible. Allow the piston or ball to fall to the bottom of the column. 7. Remove the mouthpiece from your mouth and breathe out normally. 8. Rest for a few seconds and repeat Steps 1 through 7 at least 10 times every 1-2 hours when you are awake. Take your time and take a few normal breaths between deep breaths. 9. The spirometer may include an indicator to show your best effort. Use the indicator as a goal to work toward during each repetition. 10. After each set of 10 deep breaths, practice coughing to be sure your lungs are clear. If you have an incision (the cut made at the time of surgery), support your incision when coughing by placing a pillow or rolled up towels firmly against it. Once you are able to get out of bed, walk around indoors and cough well. You may stop using the incentive spirometer when instructed by your caregiver.  RISKS AND COMPLICATIONS  Take your time so you do not get dizzy or light-headed.  If you are in pain, you may need to take or ask for pain medication before doing incentive spirometry. It is harder to take a deep breath if you are having pain. AFTER USE  Rest and breathe slowly and easily.  It can be helpful to keep track of a log of your progress. Your caregiver can provide you with a simple table to help with this. If you are using the spirometer at home,  follow these instructions: [place] IF:   You are having difficultly using the spirometer.  You have trouble using the spirometer as often as instructed.  Your pain medication is not giving enough relief while using the spirometer.  You develop fever of 100.5 F (38.1 C) or higher. SEEK IMMEDIATE MEDICAL CARE IF:   You cough up bloody sputum that had not been present before.  You develop fever of 102 F (38.9 C) or greater.  You develop worsening pain at or near the incision site. MAKE SURE YOU:   Understand these instructions.  Will watch your condition.  Will get help right away if you are not doing well or get worse. Document Released: 08/11/2006 Document Revised: 06/23/2011 Document Reviewed: 10/12/2006 Exit[employer] Patient Information 2014 Exit[employer], [place].   ________________________________________________________________________  WHAT IS A BLOOD TRANSFUSION? Blood Transfusion Information  A transfusion is the replacement of blood or some of its parts. Blood is made up of multiple cells which provide different functions.  Red blood cells carry oxygen and are used for blood loss replacement.  White blood cells fight against infection.  Platelets control bleeding.  Plasma helps clot blood.  Other blood products are available for specialized needs, such as hemophilia or other clotting disorders. BEFORE THE TRANSFUSION  Who gives blood for transfusions?   Healthy volunteers who are fully evaluated to make sure their blood is safe. This is blood bank blood. Transfusion therapy is the safest it has ever been in the practice of medicine. Before blood is taken from a donor, a complete history is taken to make sure that person has no history of diseases nor engages in risky social behavior (examples are intravenous drug use or sexual activity with multiple partners). The donor's travel history is screened to minimize risk of transmitting infections, such as malaria.  The donated blood is tested for signs of infectious diseases, such as HIV and hepatitis. The blood is  then tested to be sure it is compatible with you in order to minimize the chance of a transfusion reaction. If you or a relative donates blood, this is often done in anticipation of surgery and is not appropriate for emergency situations. It takes many days to process the donated blood. RISKS AND COMPLICATIONS Although transfusion therapy is very safe and saves many lives, the main dangers of transfusion include:   Getting an infectious disease.  Developing a transfusion reaction. This is an allergic reaction to something in the blood you were given. Every precaution is taken to prevent this. The decision to have a blood transfusion has been considered carefully by your caregiver before blood is given. Blood is not given unless the benefits outweigh the risks. AFTER THE TRANSFUSION  Right after receiving a blood transfusion, you will usually feel much better and more energetic. This is especially true if your red blood cells have gotten low (anemic). The transfusion raises the level of the red blood cells which carry oxygen, and this usually causes an energy increase.  The nurse administering the transfusion will monitor you carefully for complications. HOME CARE INSTRUCTIONS  No special instructions are needed after a transfusion. You may find your energy is better. Speak with your caregiver about any limitations on activity for underlying diseases you may have. SEEK MEDICAL CARE IF:   Your condition is not improving after your transfusion.  You develop redness or irritation at the intravenous (IV) site. SEEK IMMEDIATE MEDICAL CARE IF:  Any of the following symptoms occur over the next 12 hours:  Shaking chills.  You have a temperature by mouth above 102 F (38.9 C), not controlled by medicine.  Chest, back, or muscle pain.  People around you feel you are not acting correctly or are  confused.  Shortness of breath or difficulty breathing.  Dizziness and fainting.  You get a rash or develop hives.  You have a decrease in urine output.  Your urine turns a dark color or changes to pink, red, or brown. Any of the following symptoms occur over the next 10 days:  You have a temperature by mouth above 102 F (38.9 C), not controlled by medicine.  Shortness of breath.  Weakness after normal activity.  The white part of the eye turns yellow (jaundice).  You have a decrease in the amount of urine or are urinating less often.  Your urine turns a dark color or changes to pink, red, or brown. Document Released: 03/28/2000 Document Revised: 06/23/2011 Document Reviewed: 11/15/2007 [facility] Patient Information 2014 [place], [place].  _______________________________________________________________________

## 2019-03-18 ENCOUNTER — Other Ambulatory Visit (HOSPITAL_COMMUNITY)
Admission: RE | Admit: 2019-03-18 | Discharge: 2019-03-18 | Disposition: A | Discharge status: Home / Self Care | Payer: No Typology Code available for payment source | Source: Ambulatory Visit | Attending: Orthopedic Surgery | Admitting: Orthopedic Surgery

## 2019-03-18 ENCOUNTER — Other Ambulatory Visit: Payer: Self-pay

## 2019-03-18 DIAGNOSIS — Z01818 Encounter for other preprocedural examination: Secondary | ICD-10-CM | POA: Diagnosis not present

## 2019-03-18 DIAGNOSIS — M1712 Unilateral primary osteoarthritis, left knee: Secondary | ICD-10-CM | POA: Diagnosis not present

## 2019-03-18 DIAGNOSIS — Z20828 Contact with and (suspected) exposure to other viral communicable diseases: Secondary | ICD-10-CM | POA: Diagnosis not present

## 2019-03-18 LAB — SARS CORONAVIRUS 2 (TAT 6-24 HRS): SARS Coronavirus 2: NEGATIVE

## 2019-03-21 ENCOUNTER — Other Ambulatory Visit: Payer: Self-pay

## 2019-03-21 ENCOUNTER — Encounter (HOSPITAL_COMMUNITY): Payer: Self-pay

## 2019-03-21 ENCOUNTER — Encounter (HOSPITAL_COMMUNITY)
Admission: RE | Admit: 2019-03-21 | Discharge: 2019-03-21 | Disposition: A | Discharge status: Home / Self Care | Payer: No Typology Code available for payment source | Source: Ambulatory Visit | Attending: Orthopedic Surgery | Admitting: Orthopedic Surgery

## 2019-03-21 DIAGNOSIS — Z20828 Contact with and (suspected) exposure to other viral communicable diseases: Secondary | ICD-10-CM | POA: Insufficient documentation

## 2019-03-21 DIAGNOSIS — M1712 Unilateral primary osteoarthritis, left knee: Secondary | ICD-10-CM | POA: Insufficient documentation

## 2019-03-21 DIAGNOSIS — Z01818 Encounter for other preprocedural examination: Secondary | ICD-10-CM | POA: Diagnosis not present

## 2019-03-21 LAB — BASIC METABOLIC PANEL
Anion gap: 9 (ref 5–15)
BUN: 17 mg/dL (ref 8–23)
CO2: 26 mmol/L (ref 22–32)
Calcium: 9.1 mg/dL (ref 8.9–10.3)
Chloride: 107 mmol/L (ref 98–111)
Creatinine, Ser: 0.91 mg/dL (ref 0.61–1.24)
GFR calc Af Amer: >60 mL/min (ref >60)
GFR calc non Af Amer: >60 mL/min (ref >60)
Glucose, Bld: 95 mg/dL (ref 70–99)
Potassium: 3.9 mmol/L (ref 3.5–5.1)
Sodium: 142 mmol/L (ref 135–145)

## 2019-03-21 LAB — CBC
HCT: 43.4 % (ref 39.0–52.0)
Hemoglobin: 13.8 g/dL (ref 13.0–17.0)
MCH: 31.7 pg (ref 26.0–34.0)
MCHC: 31.8 g/dL (ref 30.0–36.0)
MCV: 99.8 fL (ref 80.0–100.0)
Platelets: 279 10*3/uL (ref 150–400)
RBC: 4.35 MIL/uL (ref 4.22–5.81)
RDW: 12.4 % (ref 11.5–15.5)
WBC: 7.4 10*3/uL (ref 4.0–10.5)
nRBC: 0 % (ref 0.0–0.2)

## 2019-03-21 LAB — SURGICAL PCR SCREEN
MRSA, PCR: NEGATIVE
Staphylococcus aureus: NEGATIVE

## 2019-03-21 NOTE — Progress Notes (Addendum)
PCP - Dr. Baltazar Apo Cardiologist - N/A  Chest x-ray -N/A  EKG - 10/047/2020 Stress Test - N/A ECHO - N/A Cardiac Cath -N/A   Sleep Study - many years ago-Diagnosed with Sleep apnea CPAP - No, unable to tolerate to sleep  Fasting Blood Sugar - N/A Checks Blood Sugar _____ times a day  Blood Thinner Instructions:N/A Aspirin Instructions:Preventive treatment by Dr. Baltazar Apo Last Dose:Saturday 03/19/2019  Anesthesia review:   Patient has a history of Sleep apnea, HTN, and kidney stones  Patient denies shortness of breath, fever, cough and chest pain at PAT appointment   Patient verbalized understanding of instructions that were given to them at the PAT appointment. Patient was also instructed that they will need to review over the PAT instructions again at home before surgery.

## 2019-03-22 ENCOUNTER — Encounter (HOSPITAL_COMMUNITY): Payer: Self-pay

## 2019-03-22 ENCOUNTER — Ambulatory Visit (HOSPITAL_COMMUNITY): Payer: No Typology Code available for payment source | Admitting: Registered Nurse

## 2019-03-22 ENCOUNTER — Encounter (HOSPITAL_COMMUNITY)
Admission: RE | Disposition: A | Discharge status: Home / Self Care | Payer: Self-pay | Source: Home / Self Care | Attending: Orthopedic Surgery

## 2019-03-22 ENCOUNTER — Ambulatory Visit (HOSPITAL_COMMUNITY): Payer: No Typology Code available for payment source | Admitting: Physician Assistant

## 2019-03-22 ENCOUNTER — Observation Stay (HOSPITAL_COMMUNITY)
Admission: RE | Admit: 2019-03-22 | Discharge: 2019-03-23 | Disposition: A | Discharge status: Home / Self Care | Payer: No Typology Code available for payment source | Attending: Orthopedic Surgery | Admitting: Orthopedic Surgery

## 2019-03-22 DIAGNOSIS — Z96651 Presence of right artificial knee joint: Secondary | ICD-10-CM | POA: Diagnosis not present

## 2019-03-22 DIAGNOSIS — M1712 Unilateral primary osteoarthritis, left knee: Secondary | ICD-10-CM | POA: Diagnosis not present

## 2019-03-22 DIAGNOSIS — Z79899 Other long term (current) drug therapy: Secondary | ICD-10-CM | POA: Diagnosis not present

## 2019-03-22 DIAGNOSIS — Z85820 Personal history of malignant melanoma of skin: Secondary | ICD-10-CM | POA: Diagnosis not present

## 2019-03-22 DIAGNOSIS — E669 Obesity, unspecified: Secondary | ICD-10-CM | POA: Diagnosis not present

## 2019-03-22 DIAGNOSIS — G4733 Obstructive sleep apnea (adult) (pediatric): Secondary | ICD-10-CM | POA: Insufficient documentation

## 2019-03-22 DIAGNOSIS — Z96652 Presence of left artificial knee joint: Secondary | ICD-10-CM

## 2019-03-22 DIAGNOSIS — I10 Essential (primary) hypertension: Secondary | ICD-10-CM | POA: Diagnosis not present

## 2019-03-22 HISTORY — PX: TOTAL KNEE ARTHROPLASTY: SHX125

## 2019-03-22 LAB — TYPE AND SCREEN
ABO/RH(D): O POS
Antibody Screen: NEGATIVE

## 2019-03-22 SURGERY — ARTHROPLASTY, KNEE, TOTAL
Anesthesia: Monitor Anesthesia Care | Site: Knee | Laterality: Left

## 2019-03-22 MED ORDER — DEXAMETHASONE SODIUM PHOSPHATE 10 MG/ML IJ SOLN
10.0000 mg | Freq: Once | INTRAMUSCULAR | Status: AC
  Administered 2019-03-22: 8 mg via INTRAVENOUS

## 2019-03-22 MED ORDER — BISACODYL 10 MG RE SUPP: 10.0000 mg | Freq: Every day | RECTAL | Status: DC | PRN

## 2019-03-22 MED ORDER — KETOROLAC TROMETHAMINE 30 MG/ML IJ SOLN
INTRAMUSCULAR | Status: DC | PRN
  Administered 2019-03-22: 30 mg via INTRA_ARTICULAR

## 2019-03-22 MED ORDER — TRANEXAMIC ACID-NACL 1000-0.7 MG/100ML-% IV SOLN
1000.0000 mg | INTRAVENOUS | Status: AC
  Administered 2019-03-22: 1000 mg via INTRAVENOUS
  Filled 2019-03-22: qty 100

## 2019-03-22 MED ORDER — DIPHENHYDRAMINE HCL 12.5 MG/5ML PO ELIX: 12.5000 mg | ORAL_SOLUTION | ORAL | Status: DC | PRN

## 2019-03-22 MED ORDER — DEXAMETHASONE SODIUM PHOSPHATE 10 MG/ML IJ SOLN
10.0000 mg | Freq: Once | INTRAMUSCULAR | Status: AC
  Administered 2019-03-23: 10 mg via INTRAVENOUS
  Filled 2019-03-22: qty 1

## 2019-03-22 MED ORDER — METHOCARBAMOL 500 MG IVPB - SIMPLE MED
500.0000 mg | Freq: Four times a day (QID) | INTRAVENOUS | Status: DC | PRN
  Administered 2019-03-22: 500 mg via INTRAVENOUS
  Filled 2019-03-22: qty 50

## 2019-03-22 MED ORDER — LIDOCAINE 2% (20 MG/ML) 5 ML SYRINGE
INTRAMUSCULAR | Status: DC | PRN
  Administered 2019-03-22: 80 mg via INTRAVENOUS

## 2019-03-22 MED ORDER — TOBRAMYCIN SULFATE 1.2 G IJ SOLR
INTRAMUSCULAR | Status: AC
  Filled 2019-03-22: qty 1.2

## 2019-03-22 MED ORDER — PROPOFOL 10 MG/ML IV BOLUS
INTRAVENOUS | Status: DC | PRN
  Administered 2019-03-22: 250 mg via INTRAVENOUS

## 2019-03-22 MED ORDER — METOCLOPRAMIDE HCL 5 MG/ML IJ SOLN: 5.0000 mg | Freq: Three times a day (TID) | INTRAMUSCULAR | Status: DC | PRN

## 2019-03-22 MED ORDER — OXYCODONE HCL 5 MG PO TABS
ORAL_TABLET | ORAL | Status: AC
  Filled 2019-03-22: qty 1

## 2019-03-22 MED ORDER — DOCUSATE SODIUM 100 MG PO CAPS: 100.0000 mg | ORAL_CAPSULE | Freq: Two times a day (BID) | ORAL | 0 refills | Status: DC

## 2019-03-22 MED ORDER — OXYCODONE HCL 5 MG PO TABS
5.0000 mg | ORAL_TABLET | Freq: Once | ORAL | Status: AC | PRN
  Administered 2019-03-22: 5 mg via ORAL

## 2019-03-22 MED ORDER — VANCOMYCIN HCL 1000 MG IV SOLR
INTRAVENOUS | Status: AC
  Filled 2019-03-22: qty 1000

## 2019-03-22 MED ORDER — METHOCARBAMOL 500 MG PO TABS
500.0000 mg | ORAL_TABLET | Freq: Four times a day (QID) | ORAL | Status: DC | PRN
  Administered 2019-03-22: 500 mg via ORAL
  Filled 2019-03-22: qty 1

## 2019-03-22 MED ORDER — FENTANYL CITRATE (PF) 100 MCG/2ML IJ SOLN
INTRAMUSCULAR | Status: AC
  Filled 2019-03-22: qty 2

## 2019-03-22 MED ORDER — MIDAZOLAM HCL 5 MG/5ML IJ SOLN
INTRAMUSCULAR | Status: DC | PRN
  Administered 2019-03-22: 2 mg via INTRAVENOUS

## 2019-03-22 MED ORDER — 0.9 % SODIUM CHLORIDE (POUR BTL) OPTIME
TOPICAL | Status: DC | PRN
  Administered 2019-03-22: 1000 mL

## 2019-03-22 MED ORDER — SODIUM CHLORIDE (PF) 0.9 % IJ SOLN
INTRAMUSCULAR | Status: AC
  Filled 2019-03-22: qty 50

## 2019-03-22 MED ORDER — BUPIVACAINE HCL (PF) 0.25 % IJ SOLN
INTRAMUSCULAR | Status: AC
  Filled 2019-03-22: qty 30

## 2019-03-22 MED ORDER — HYDROCODONE-ACETAMINOPHEN 5-325 MG PO TABS
1.0000 | ORAL_TABLET | ORAL | Status: DC | PRN
  Administered 2019-03-22: 1 via ORAL
  Administered 2019-03-22: 2 via ORAL
  Administered 2019-03-23: 1 via ORAL
  Filled 2019-03-22 (×2): qty 1
  Filled 2019-03-22: qty 2

## 2019-03-22 MED ORDER — CHLORHEXIDINE GLUCONATE 4 % EX LIQD: 60.0000 mL | Freq: Once | CUTANEOUS | Status: DC

## 2019-03-22 MED ORDER — SODIUM CHLORIDE 0.9 % IV SOLN
INTRAVENOUS | Status: DC
  Administered 2019-03-22: 1000 mL via INTRAVENOUS

## 2019-03-22 MED ORDER — STERILE WATER FOR IRRIGATION IR SOLN
Status: DC | PRN
  Administered 2019-03-22: 2000 mL

## 2019-03-22 MED ORDER — FENTANYL CITRATE (PF) 100 MCG/2ML IJ SOLN
INTRAMUSCULAR | Status: DC | PRN
  Administered 2019-03-22: 25 ug via INTRAVENOUS
  Administered 2019-03-22 (×4): 50 ug via INTRAVENOUS

## 2019-03-22 MED ORDER — MIDAZOLAM HCL 2 MG/2ML IJ SOLN
INTRAMUSCULAR | Status: AC
  Filled 2019-03-22: qty 2

## 2019-03-22 MED ORDER — SODIUM CHLORIDE 0.9 % IV BOLUS
250.0000 mL | Freq: Once | INTRAVENOUS | Status: AC
  Administered 2019-03-22: 250 mL via INTRAVENOUS

## 2019-03-22 MED ORDER — METHOCARBAMOL 500 MG IVPB - SIMPLE MED
INTRAVENOUS | Status: AC
  Filled 2019-03-22: qty 50

## 2019-03-22 MED ORDER — METHOCARBAMOL 500 MG PO TABS: 500.0000 mg | ORAL_TABLET | Freq: Four times a day (QID) | ORAL | 0 refills | Status: DC | PRN

## 2019-03-22 MED ORDER — CELECOXIB 200 MG PO CAPS
200.0000 mg | ORAL_CAPSULE | Freq: Two times a day (BID) | ORAL | Status: DC
  Administered 2019-03-22 – 2019-03-23 (×2): 200 mg via ORAL
  Filled 2019-03-22 (×2): qty 1

## 2019-03-22 MED ORDER — ASPIRIN 81 MG PO CHEW
81.0000 mg | CHEWABLE_TABLET | Freq: Two times a day (BID) | ORAL | Status: DC
  Administered 2019-03-22 – 2019-03-23 (×2): 81 mg via ORAL
  Filled 2019-03-22 (×2): qty 1

## 2019-03-22 MED ORDER — METHOCARBAMOL 1000 MG/10ML IJ SOLN
500.0000 mg | Freq: Four times a day (QID) | INTRAVENOUS | Status: DC | PRN
  Filled 2019-03-22: qty 5

## 2019-03-22 MED ORDER — MENTHOL 3 MG MT LOZG: 1.0000 | LOZENGE | OROMUCOSAL | Status: DC | PRN

## 2019-03-22 MED ORDER — BUPIVACAINE HCL (PF) 0.25 % IJ SOLN
INTRAMUSCULAR | Status: DC | PRN
  Administered 2019-03-22: 30 mL via INTRA_ARTICULAR

## 2019-03-22 MED ORDER — PHENOL 1.4 % MT LIQD: 1.0000 | OROMUCOSAL | Status: DC | PRN

## 2019-03-22 MED ORDER — SODIUM CHLORIDE 0.9 % IR SOLN
Status: DC | PRN
  Administered 2019-03-22: 1000 mL

## 2019-03-22 MED ORDER — DOCUSATE SODIUM 100 MG PO CAPS
100.0000 mg | ORAL_CAPSULE | Freq: Two times a day (BID) | ORAL | Status: DC
  Administered 2019-03-22 – 2019-03-23 (×2): 100 mg via ORAL
  Filled 2019-03-22 (×2): qty 1

## 2019-03-22 MED ORDER — ACETAMINOPHEN 325 MG PO TABS: 325.0000 mg | ORAL_TABLET | Freq: Four times a day (QID) | ORAL | Status: DC | PRN

## 2019-03-22 MED ORDER — ONDANSETRON HCL 4 MG PO TABS: 4.0000 mg | ORAL_TABLET | Freq: Four times a day (QID) | ORAL | Status: DC | PRN

## 2019-03-22 MED ORDER — ASPIRIN 81 MG PO CHEW: 81.0000 mg | CHEWABLE_TABLET | Freq: Two times a day (BID) | ORAL | 0 refills | Status: AC

## 2019-03-22 MED ORDER — FERROUS SULFATE 325 (65 FE) MG PO TABS: 325.0000 mg | ORAL_TABLET | Freq: Three times a day (TID) | ORAL | 0 refills | Status: DC

## 2019-03-22 MED ORDER — LACTATED RINGERS IV SOLN
INTRAVENOUS | Status: DC
  Administered 2019-03-22 (×2): via INTRAVENOUS

## 2019-03-22 MED ORDER — HYDROMORPHONE HCL 1 MG/ML IJ SOLN
0.2500 mg | INTRAMUSCULAR | Status: DC | PRN
  Administered 2019-03-22 (×4): 0.5 mg via INTRAVENOUS

## 2019-03-22 MED ORDER — HYDROMORPHONE HCL 1 MG/ML IJ SOLN
INTRAMUSCULAR | Status: AC
  Filled 2019-03-22: qty 1

## 2019-03-22 MED ORDER — ALUM & MAG HYDROXIDE-SIMETH 200-200-20 MG/5ML PO SUSP: 15.0000 mL | ORAL | Status: DC | PRN

## 2019-03-22 MED ORDER — METOCLOPRAMIDE HCL 5 MG PO TABS: 5.0000 mg | ORAL_TABLET | Freq: Three times a day (TID) | ORAL | Status: DC | PRN

## 2019-03-22 MED ORDER — OXYCODONE HCL 5 MG/5ML PO SOLN: 5.0000 mg | Freq: Once | ORAL | Status: AC | PRN

## 2019-03-22 MED ORDER — POLYETHYLENE GLYCOL 3350 17 G PO PACK: 17.0000 g | PACK | Freq: Two times a day (BID) | ORAL | 0 refills | Status: DC

## 2019-03-22 MED ORDER — PROPOFOL 500 MG/50ML IV EMUL
INTRAVENOUS | Status: AC
  Filled 2019-03-22: qty 50

## 2019-03-22 MED ORDER — CEFAZOLIN SODIUM-DEXTROSE 2-4 GM/100ML-% IV SOLN
2.0000 g | INTRAVENOUS | Status: AC
  Administered 2019-03-22: 2 g via INTRAVENOUS
  Filled 2019-03-22: qty 100

## 2019-03-22 MED ORDER — CEFAZOLIN SODIUM-DEXTROSE 2-4 GM/100ML-% IV SOLN
2.0000 g | Freq: Four times a day (QID) | INTRAVENOUS | Status: AC
  Administered 2019-03-22 (×2): 2 g via INTRAVENOUS
  Filled 2019-03-22 (×2): qty 100

## 2019-03-22 MED ORDER — TRANEXAMIC ACID-NACL 1000-0.7 MG/100ML-% IV SOLN
1000.0000 mg | Freq: Once | INTRAVENOUS | Status: AC
  Administered 2019-03-22: 1000 mg via INTRAVENOUS
  Filled 2019-03-22: qty 100

## 2019-03-22 MED ORDER — HYDROCODONE-ACETAMINOPHEN 7.5-325 MG PO TABS: 1.0000 | ORAL_TABLET | Freq: Four times a day (QID) | ORAL | Status: DC | PRN

## 2019-03-22 MED ORDER — POLYETHYLENE GLYCOL 3350 17 G PO PACK
17.0000 g | PACK | Freq: Two times a day (BID) | ORAL | Status: DC
  Administered 2019-03-22 – 2019-03-23 (×2): 17 g via ORAL
  Filled 2019-03-22 (×2): qty 1

## 2019-03-22 MED ORDER — HYDROMORPHONE HCL 1 MG/ML IJ SOLN: 0.5000 mg | INTRAMUSCULAR | Status: DC | PRN

## 2019-03-22 MED ORDER — MAGNESIUM CITRATE PO SOLN: 1.0000 | Freq: Once | ORAL | Status: DC | PRN

## 2019-03-22 MED ORDER — HYDROCODONE-ACETAMINOPHEN 7.5-325 MG PO TABS: 1.0000 | ORAL_TABLET | ORAL | Status: DC | PRN

## 2019-03-22 MED ORDER — KETOROLAC TROMETHAMINE 30 MG/ML IJ SOLN
INTRAMUSCULAR | Status: AC
  Filled 2019-03-22: qty 1

## 2019-03-22 MED ORDER — ONDANSETRON HCL 4 MG/2ML IJ SOLN
INTRAMUSCULAR | Status: DC | PRN
  Administered 2019-03-22: 4 mg via INTRAVENOUS

## 2019-03-22 MED ORDER — HYDROCODONE-ACETAMINOPHEN 7.5-325 MG PO TABS: 1.0000 | ORAL_TABLET | ORAL | 0 refills | Status: DC | PRN

## 2019-03-22 MED ORDER — POVIDONE-IODINE 10 % EX SWAB
2.0000 "application " | Freq: Once | CUTANEOUS | Status: AC
  Administered 2019-03-22: 2 via TOPICAL

## 2019-03-22 MED ORDER — PROMETHAZINE HCL 25 MG/ML IJ SOLN: 6.2500 mg | INTRAMUSCULAR | Status: DC | PRN

## 2019-03-22 MED ORDER — ONDANSETRON HCL 4 MG/2ML IJ SOLN: 4.0000 mg | Freq: Four times a day (QID) | INTRAMUSCULAR | Status: DC | PRN

## 2019-03-22 MED ORDER — HYDROMORPHONE HCL 1 MG/ML IJ SOLN
0.5000 mg | INTRAMUSCULAR | Status: DC | PRN
  Administered 2019-03-22: 0.5 mg via INTRAVENOUS
  Filled 2019-03-22: qty 1

## 2019-03-22 MED ORDER — LIDOCAINE 2% (20 MG/ML) 5 ML SYRINGE
INTRAMUSCULAR | Status: AC
  Filled 2019-03-22: qty 5

## 2019-03-22 MED ORDER — SODIUM CHLORIDE (PF) 0.9 % IJ SOLN
INTRAMUSCULAR | Status: DC | PRN
  Administered 2019-03-22: 30 mL

## 2019-03-22 MED ORDER — FERROUS SULFATE 325 (65 FE) MG PO TABS
325.0000 mg | ORAL_TABLET | Freq: Two times a day (BID) | ORAL | Status: DC
  Administered 2019-03-22 – 2019-03-23 (×2): 325 mg via ORAL
  Filled 2019-03-22 (×2): qty 1

## 2019-03-22 MED ORDER — ROPIVACAINE HCL 5 MG/ML IJ SOLN
INTRAMUSCULAR | Status: DC | PRN
  Administered 2019-03-22: 20 mL via PERINEURAL

## 2019-03-22 SURGICAL SUPPLY — 64 items
ATTUNE MED ANAT PAT 38 KNEE (Knees) ×2 IMPLANT
ATTUNE MED ANAT PAT 38MM KNEE (Knees) ×1 IMPLANT
ATTUNE PS FEM LT SZ 5 CEM KNEE (Femur) ×3 IMPLANT
ATTUNE PSRP INSR SZ5 6 KNEE (Insert) ×2 IMPLANT
ATTUNE PSRP INSR SZ5 6MM KNEE (Insert) ×1 IMPLANT
BAG ZIPLOCK 12X15 (MISCELLANEOUS) IMPLANT
BASE TIBIAL ROT PLAT SZ 5 KNEE (Knees) ×1 IMPLANT
BLADE SAW SGTL 11.0X1.19X90.0M (BLADE) IMPLANT
BLADE SAW SGTL 13.0X1.19X90.0M (BLADE) ×3 IMPLANT
BLADE SURG SZ10 CARB STEEL (BLADE) ×6 IMPLANT
BNDG ELASTIC 6X5.8 VLCR STR LF (GAUZE/BANDAGES/DRESSINGS) ×3 IMPLANT
BOWL SMART MIX CTS (DISPOSABLE) ×3 IMPLANT
CEMENT HV SMART SET (Cement) ×6 IMPLANT
COVER SURGICAL LIGHT HANDLE (MISCELLANEOUS) ×3 IMPLANT
COVER WAND RF STERILE (DRAPES) IMPLANT
CUFF TOURN SGL QUICK 34 (TOURNIQUET CUFF) ×2
CUFF TRNQT CYL 34X4.125X (TOURNIQUET CUFF) ×1 IMPLANT
DECANTER SPIKE VIAL GLASS SM (MISCELLANEOUS) ×6 IMPLANT
DERMABOND ADVANCED (GAUZE/BANDAGES/DRESSINGS) ×2
DERMABOND ADVANCED .7 DNX12 (GAUZE/BANDAGES/DRESSINGS) ×1 IMPLANT
DRAPE U-SHAPE 47X51 STRL (DRAPES) ×3 IMPLANT
DRESSING AQUACEL AG SP 3.5X10 (GAUZE/BANDAGES/DRESSINGS) ×1 IMPLANT
DRSG AQUACEL AG ADV 3.5X10 (GAUZE/BANDAGES/DRESSINGS) ×3 IMPLANT
DRSG AQUACEL AG SP 3.5X10 (GAUZE/BANDAGES/DRESSINGS) ×3
DURAPREP 26ML APPLICATOR (WOUND CARE) ×6 IMPLANT
ELECT REM PT RETURN 15FT ADLT (MISCELLANEOUS) ×3 IMPLANT
GLOVE BIO SURGEON STRL SZ 6 (GLOVE) ×3 IMPLANT
GLOVE BIOGEL PI IND STRL 6.5 (GLOVE) ×1 IMPLANT
GLOVE BIOGEL PI IND STRL 7.5 (GLOVE) ×1 IMPLANT
GLOVE BIOGEL PI IND STRL 8.5 (GLOVE) ×1 IMPLANT
GLOVE BIOGEL PI INDICATOR 6.5 (GLOVE) ×2
GLOVE BIOGEL PI INDICATOR 7.5 (GLOVE) ×2
GLOVE BIOGEL PI INDICATOR 8.5 (GLOVE) ×2
GLOVE ECLIPSE 8.0 STRL XLNG CF (GLOVE) ×3 IMPLANT
GLOVE ORTHO TXT STRL SZ7.5 (GLOVE) ×3 IMPLANT
GOWN STRL REUS W/ TWL LRG LVL3 (GOWN DISPOSABLE) ×1 IMPLANT
GOWN STRL REUS W/TWL 2XL LVL3 (GOWN DISPOSABLE) ×3 IMPLANT
GOWN STRL REUS W/TWL LRG LVL3 (GOWN DISPOSABLE) ×5 IMPLANT
HANDPIECE INTERPULSE COAX TIP (DISPOSABLE) ×2
HOLDER FOLEY CATH W/STRAP (MISCELLANEOUS) IMPLANT
KIT TURNOVER KIT A (KITS) IMPLANT
MANIFOLD NEPTUNE II (INSTRUMENTS) ×3 IMPLANT
NDL SAFETY ECLIPSE 18X1.5 (NEEDLE) ×1 IMPLANT
NEEDLE HYPO 18GX1.5 SHARP (NEEDLE) ×2
NS IRRIG 1000ML POUR BTL (IV SOLUTION) ×3 IMPLANT
PACK TOTAL KNEE CUSTOM (KITS) ×3 IMPLANT
PENCIL SMOKE EVACUATOR (MISCELLANEOUS) ×3 IMPLANT
PIN DRILL FIX HALF THREAD (BIT) ×3 IMPLANT
PIN FIX SIGMA LCS THRD HI (PIN) ×3 IMPLANT
PROTECTOR NERVE ULNAR (MISCELLANEOUS) ×3 IMPLANT
SET HNDPC FAN SPRY TIP SCT (DISPOSABLE) ×1 IMPLANT
SET PAD KNEE POSITIONER (MISCELLANEOUS) ×3 IMPLANT
SUT MNCRL AB 4-0 PS2 18 (SUTURE) ×3 IMPLANT
SUT STRATAFIX PDS+ 0 24IN (SUTURE) ×3 IMPLANT
SUT VIC AB 1 CT1 36 (SUTURE) ×3 IMPLANT
SUT VIC AB 2-0 CT1 27 (SUTURE) ×6
SUT VIC AB 2-0 CT1 TAPERPNT 27 (SUTURE) ×3 IMPLANT
SYR 3ML LL SCALE MARK (SYRINGE) ×3 IMPLANT
TIBIAL BASE ROT PLAT SZ 5 KNEE (Knees) ×3 IMPLANT
TRAY CATH 16FR W/PLASTIC CATH (SET/KITS/TRAYS/PACK) ×3 IMPLANT
TRAY FOLEY MTR SLVR 16FR STAT (SET/KITS/TRAYS/PACK) IMPLANT
WATER STERILE IRR 1000ML POUR (IV SOLUTION) ×6 IMPLANT
WRAP KNEE MAXI GEL POST OP (GAUZE/BANDAGES/DRESSINGS) ×3 IMPLANT
YANKAUER SUCT BULB TIP 10FT TU (MISCELLANEOUS) ×3 IMPLANT

## 2019-03-22 NOTE — Plan of Care (Signed)
Plan of care for post-op day 0 discussed with patient.   Will continue to monitor.    SWhittemore, RN    

## 2019-03-22 NOTE — Anesthesia Procedure Notes (Signed)
Procedure Name: LMA Insertion Date/Time: 03/22/2019 7:26 AM Performed by: Victoriano Lain, CRNA Pre-anesthesia Checklist: Patient identified, Emergency Drugs available, Suction available, Patient being monitored and Timeout performed Patient Re-evaluated:Patient Re-evaluated prior to induction Oxygen Delivery Method: Circle system utilized Preoxygenation: Pre-oxygenation with 100% oxygen Induction Type: IV induction Ventilation: Mask ventilation without difficulty LMA: LMA with gastric port inserted LMA Size: 4.0 Number of attempts: 2 Placement Confirmation: positive ETCO2 and breath sounds checked- equal and bilateral Tube secured with: Tape Dental Injury: Teeth and Oropharynx as per pre-operative assessment

## 2019-03-22 NOTE — Transfer of Care (Signed)
Immediate Anesthesia Transfer of Care Note  Patient: Fernando Riley  Procedure(s) Performed: TOTAL KNEE ARTHROPLASTY (Left Knee)  Patient Location: PACU  Anesthesia Type:General  Level of Consciousness: awake, alert , oriented and patient cooperative  Airway & Oxygen Therapy: Patient Spontanous Breathing and Patient connected to face mask oxygen  Post-op Assessment: Report given to RN, Post -op Vital signs reviewed and stable and Patient moving all extremities  Post vital signs: Reviewed and stable  Last Vitals:  Vitals Value Taken Time  BP 134/82 03/22/19 0927  Temp    Pulse 78 03/22/19 0928  Resp 15 03/22/19 0928  SpO2 97 % 03/22/19 0928  Vitals shown include unvalidated device data.  Last Pain:  Vitals:   03/22/19 0620  TempSrc: Oral  PainSc:          Complications: No apparent anesthesia complications

## 2019-03-22 NOTE — Anesthesia Preprocedure Evaluation (Signed)
Anesthesia Evaluation  Patient identified by MRN, date of birth, ID band Patient awake    Reviewed: Allergy & Precautions, NPO status , Patient's Chart, lab work & pertinent test results  History of Anesthesia Complications Negative for: history of anesthetic complications  Airway Mallampati: II  TM Distance: >3 FB Neck ROM: Full    Dental  (+) Dental Advisory Given   Pulmonary neg shortness of breath, sleep apnea , neg recent URI,    breath sounds clear to auscultation       Cardiovascular hypertension, Pt. on medications  Rhythm:Regular     Neuro/Psych negative neurological ROS  negative psych ROS   GI/Hepatic negative GI ROS, Neg liver ROS,   Endo/Other    Renal/GU negative Renal ROS     Musculoskeletal  (+) Arthritis ,   Abdominal (+) + obese,   Peds  Hematology negative hematology ROS (+) plt 229   Anesthesia Other Findings   Reproductive/Obstetrics                             Anesthesia Physical  Anesthesia Plan  ASA: II  Anesthesia Plan: MAC, Spinal and Regional   Post-op Pain Management:  Regional for Post-op pain   Induction: Intravenous  PONV Risk Score and Plan: 1 and Treatment may vary due to age or medical condition and Propofol infusion  Airway Management Planned: Simple Face Mask  Additional Equipment: None  Intra-op Plan:   Post-operative Plan:   Informed Consent: I have reviewed the patients History and Physical, chart, labs and discussed the procedure including the risks, benefits and alternatives for the proposed anesthesia with the patient or authorized representative who has indicated his/her understanding and acceptance.     Dental advisory given  Plan Discussed with: CRNA and Surgeon  Anesthesia Plan Comments:         Anesthesia Quick Evaluation

## 2019-03-22 NOTE — Evaluation (Signed)
Physical Therapy Evaluation Patient Details Name: Fernando Riley MRN: [id number] DOB: [birth year] Today's Date: 03/22/2019   History of Present Illness  Patient is 68 y.o. 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  Clinical Impression  Fernando Riley is a 68 y.o. male POD 0 s/p Lt TKA. Patient reports independence with mobility at baseline. Patient is now limited by functional impairments (see PT problem list below) and requires min assist/guard for transfers and gait with RW. Patient was able to ambulate ~100 feet with RW and min assist. Patient instructed in exercise to facilitate ROM and circulation. Patient will benefit from continued skilled PT interventions to address impairments and progress towards PLOF. Acute PT will follow to progress mobility and stair training in preparation for safe discharge home.    Follow Up Recommendations Follow surgeon's recommendation for DC plan and follow-up therapies;Supervision for mobility/OOB    Equipment Recommendations  None recommended by PT    Recommendations for Other Services       Precautions / Restrictions Precautions Precautions: Fall Restrictions Weight Bearing Restrictions: No      Mobility  Bed Mobility Overal bed mobility: Needs Assistance Bed Mobility: Supine to Sit     Supine to sit: HOB elevated;Min guard     General bed mobility comments: cues for sequencing and use of bed rail, no assist required for mobility  Transfers Overall transfer level: Needs assistance Equipment used: Rolling walker (2 wheeled) Transfers: Sit to/from Stand Sit to Stand: Min assist         General transfer comment: verbal cues for safe hand placement and technique with RW, light assist to initiate power up from EOB  Ambulation/Gait Ambulation/Gait assistance: Min guard Gait Distance (Feet): 100 Feet Assistive device: Rolling walker (2 wheeled) Gait Pattern/deviations: Step-through  pattern;Decreased stride length;Decreased step length - right;Decreased stance time - left Gait velocity: decr   General Gait Details: pt required cues for safe hand placement and step pattern initially, he demonstrated good safety awareness for proximity to walker, no overt LOB noted.  Stairs            Wheelchair Mobility    Modified Rankin (Stroke Patients Only)       Balance Overall balance assessment: Needs assistance Sitting-balance support: No upper extremity supported;Feet supported Sitting balance-Leahy Scale: Good     Standing balance support: During functional activity;Bilateral upper extremity supported Standing balance-Leahy Scale: Fair            Pertinent Vitals/Pain Pain Assessment: 0-10 Pain Score: 4  Pain Location: Lt knee Pain Descriptors / Indicators: Aching;Sore Pain Intervention(s): Limited activity within patient's tolerance;Monitored during session;Repositioned    Home Living Family/patient expects to be discharged to:: Private residence Living Arrangements: Spouse/significant other Available Help at Discharge: Family;Available 24 hours/day Type of Home: House Home Access: Stairs to enter Entrance Stairs-Rails: Right;Left;Can reach both Entrance Stairs-Number of Steps: 5 Home Layout: Two level;Bed/bath upstairs Home Equipment: Walker - 2 wheels;Bedside commode      Prior Function Level of Independence: Independent               Hand Dominance   Dominant Hand: Right    Extremity/Trunk Assessment   Upper Extremity Assessment Upper Extremity Assessment: Overall WFL for tasks assessed    Lower Extremity Assessment Lower Extremity Assessment: LLE deficits/detail LLE Deficits / Details: pt with good quad activation and no extensor lag with SLR LLE Sensation: WNL LLE Coordination: WNL    Cervical /  Trunk Assessment Cervical / Trunk Assessment: Normal  Communication   Communication: No difficulties  Cognition  Arousal/Alertness: Awake/alert Behavior During Therapy: WFL for tasks assessed/performed Overall Cognitive Status: Within Functional Limits for tasks assessed                 General Comments      Exercises Total Joint Exercises Ankle Circles/Pumps: AROM;Both;Seated;15 reps Quad Sets: AROM;10 reps;Seated;Left Heel Slides: AAROM;Seated;10 reps;Left   Assessment/Plan    PT Assessment Patient needs continued PT services  PT Problem List Decreased strength;Decreased mobility;Decreased range of motion;Decreased activity tolerance;Decreased balance;Decreased knowledge of use of DME       PT Treatment Interventions DME instruction;Therapeutic activities;Gait training;Therapeutic exercise;Patient/family education;Balance training;Stair training;Functional mobility training    PT Goals (Current goals can be found in the Care Plan section)  Acute Rehab PT Goals Patient Stated Goal: to get back to independence PT Goal Formulation: With patient Time For Goal Achievement: 03/29/19 Potential to Achieve Goals: Good    Frequency 7X/week    AM-PAC PT "6 Clicks" Mobility  Outcome Measure Help needed turning from your back to your side while in a flat bed without using bedrails?: A Little Help needed moving from lying on your back to sitting on the side of a flat bed without using bedrails?: A Little Help needed moving to and from a bed to a chair (including a wheelchair)?: A Little Help needed standing up from a chair using your arms (e.g., wheelchair or bedside chair)?: A Little Help needed to walk in hospital room?: A Little Help needed climbing 3-5 steps with a railing? : A Little 6 Click Score: 18    End of Session Equipment Utilized During Treatment: Gait belt Activity Tolerance: Patient tolerated treatment well Patient left: in chair;with call bell/phone within reach;with chair alarm set Nurse Communication: Mobility status PT Visit Diagnosis: Other abnormalities of gait and  mobility (R26.89);Difficulty in walking, not elsewhere classified (R26.2)    Time: [id number] PT Time Calculation (min) (ACUTE ONLY): 24 min   Charges:   PT Evaluation $PT Eval Low Complexity: 1 Low PT Treatments $Therapeutic Exercise: 8-22 mins       Kipp Brood, PT, DPT Physical Therapist with [facility]  03/22/2019 7:31 PM

## 2019-03-22 NOTE — Interval H&P Note (Signed)
History and Physical Interval Note:  03/22/2019 7:11 AM  Fernando Riley  has presented today for surgery, with the diagnosis of Left knee osteoarthritis.  The various methods of treatment have been discussed with the patient and family. After consideration of risks, benefits and other options for treatment, the patient has consented to  Procedure(s) with comments: TOTAL KNEE ARTHROPLASTY (Left) - 70 mins as a surgical intervention.  The patient's history has been reviewed, patient examined, no change in status, stable for surgery.  I have reviewed the patient's chart and labs.  Questions were answered to the patient's satisfaction.     [name]

## 2019-03-22 NOTE — Progress Notes (Signed)
Upon admiision to phase 2, pt expressed concerns of disable wife being able to care for him this afternoon and evening.  Adrian Prince PA contacted and will place admission orders.  Wife updated.

## 2019-03-22 NOTE — Anesthesia Postprocedure Evaluation (Signed)
Anesthesia Post Note  Patient: Fernando Riley  Procedure(s) Performed: TOTAL KNEE ARTHROPLASTY (Left Knee)     Patient location during evaluation: PACU Anesthesia Type: General Level of consciousness: awake and alert Pain management: pain level controlled Vital Signs Assessment: post-procedure vital signs reviewed and stable Respiratory status: spontaneous breathing, nonlabored ventilation and respiratory function stable Cardiovascular status: blood pressure returned to baseline and stable Postop Assessment: no apparent nausea or vomiting Anesthetic complications: no    Last Vitals:  Vitals:   03/22/19 1000 03/22/19 1015  BP: 131/72 134/80  Pulse: 72 78  Resp: 14 14  Temp:    SpO2: 98% (!) 88%    Last Pain:  Vitals:   03/22/19 1015  TempSrc:   PainSc: [place]

## 2019-03-22 NOTE — Anesthesia Procedure Notes (Signed)
Anesthesia Regional Block: Adductor canal block   Pre-Anesthetic Checklist: ,, timeout performed, Correct Patient, Correct Site, Correct Laterality, Correct Procedure, Correct Position, site marked, Risks and benefits discussed,  Surgical consent,  Pre-op evaluation,  At surgeon's request and post-op pain management  Laterality: Left  Prep: chloraprep       Needles:  Injection technique: Single-shot  Needle Type: Stimiplex     Needle Length: 9cm  Needle Gauge: 21     Additional Needles:   Procedures:,,,, ultrasound used (permanent image in chart),,,,  Narrative:  Start time: 03/22/2019 7:00 AM End time: 03/22/2019 7:05 AM Injection made incrementally with aspirations every 5 mL.  Performed by: Personally  Anesthesiologist: Lynda Rainwater, MD

## 2019-03-22 NOTE — Op Note (Signed)
NAME:  Fernando Riley                      MEDICAL RECORD NO.:  [id number]                             FACILITY:  [facility]      PHYSICIAN:  Pietro Cassis. Alvan Dame, M.D.  DATE OF BIRTH:  18-Apr-1950      DATE OF PROCEDURE:  03/22/2019                                     OPERATIVE REPORT         PREOPERATIVE DIAGNOSIS:  Left knee osteoarthritis.      POSTOPERATIVE DIAGNOSIS:  Left knee osteoarthritis.      FINDINGS:  The patient was noted to have complete loss of cartilage and   bone-on-bone arthritis with associated osteophytes in the medial and patellofemoral compartments of   the knee with a significant synovitis and associated effusion.  The patient had failed months of conservative treatment including medications, injection therapy, activity modification.     PROCEDURE:  Left total knee replacement.      COMPONENTS USED:  DePuy Attune rotating platform posterior stabilized knee   system, a size 5 femur, 5 tibia, size 6 mm PS AOX insert, and 38 anatomic patellar   button.      SURGEON:  Pietro Cassis. Alvan Dame, M.D.      ASSISTANT:  Griffith Citron, PA-C.      ANESTHESIA:  General and Regional.      SPECIMENS:  None.      COMPLICATION:  None.      DRAINS:  None.  EBL: <200cc      TOURNIQUET TIME:   Total Tourniquet Time Documented: Thigh (Left) - 40 minutes Total: Thigh (Left) - 40 minutes  .      The patient was stable to the recovery room.      INDICATION FOR PROCEDURE:  Fernando Riley is a 68 y.o. male patient of   mine.  The patient had been seen, evaluated, and treated for months conservatively in the   office with medication, activity modification, and injections.  The patient had   radiographic changes of bone-on-bone arthritis with endplate sclerosis and osteophytes noted.  Based on the radiographic changes and failed conservative measures, the patient   decided to proceed with definitive treatment, total knee replacement.  Risks of infection, DVT, component failure, need for  revision surgery, neurovascular injury were reviewed in the office setting.  The postop course was reviewed stressing the efforts to maximize post-operative satisfaction and function.  Consent was obtained for benefit of pain   relief.      PROCEDURE IN DETAIL:  The patient was brought to the operative theater.   Once adequate anesthesia, preoperative antibiotics, 2 gm of Ancef,1 gm of Tranexamic Acid, and 10 mg of Decadron administered, the patient was positioned supine with a left thigh tourniquet placed.  The  left lower extremity was prepped and draped in sterile fashion.  A time-   out was performed identifying the patient, planned procedure, and the appropriate extremity.      The left lower extremity was placed in the [facility] leg holder.  The leg was   exsanguinated, tourniquet elevated to 250 mmHg.  A midline incision was   made  followed by median parapatellar arthrotomy.  Following initial   exposure, attention was first directed to the patella.  Precut   measurement was noted to be 22 mm.  I resected down to 13 mm and used a   38 anatomic patellar button to restore patellar height as well as cover the cut surface.      The lug holes were drilled and a metal shim was placed to protect the   patella from retractors and saw blade during the procedure.      At this point, attention was now directed to the femur.  The femoral   canal was opened with a drill, irrigated to try to prevent fat emboli.  An   intramedullary rod was passed at 5 degrees valgus, 9 mm of bone was   resected off the distal femur.  Following this resection, the tibia was   subluxated anteriorly.  Using the extramedullary guide, 4 mm of bone was resected off   the proximal medial tibia.  We confirmed the gap would be   stable medially and laterally with a size 5 spacer block as well as confirmed that the tibial cut was perpendicular in the coronal plane, checking with an alignment rod.      Once this was done, I  sized the femur to be a size 5 in the anterior-   posterior dimension, chose a standard component based on medial and   lateral dimension.  The size 5 rotation block was then pinned in   position anterior referenced using the C-clamp to set rotation.  The   anterior, posterior, and  chamfer cuts were made without difficulty nor   notching making certain that I was along the anterior cortex to help   with flexion gap stability.      The final box cut was made off the lateral aspect of distal femur.      At this point, the tibia was sized to be a size 5.  The size 5 tray was   then pinned in position through the medial third of the tubercle,   drilled, and keel punched.  Trial reduction was now carried with a 5 femur,  5 tibia, a size 6 mm PS insert, and the 38 anatomic patella botton.  The knee was brought to full extension with good flexion stability with the patella   tracking through the trochlea without application of pressure.  Given   all these findings the trial components removed.  Final components were   opened and cement was mixed.  The knee was irrigated with normal saline solution and pulse lavage.  The synovial lining was   then injected with 30 cc of 0.25% Marcaine with epinephrine, 1 cc of Toradol and 30 cc of NS for a total of 61 cc.     Final implants were then cemented onto cleaned and dried cut surfaces of bone with the knee brought to extension with a size 6 mm PS trial insert.      Once the cement had fully cured, excess cement was removed   throughout the knee.  I confirmed that I was satisfied with the range of   motion and stability, and the final size 6 mm PS AOX insert was chosen.  It was   placed into the knee.      The tourniquet had been let down at 40 minutes.  No significant   hemostasis was required.  The extensor mechanism was then reapproximated using #1 Vicryl and #  1 Stratafix sutures with the knee   in flexion.  The   remaining wound was closed with 2-0  Vicryl and running 4-0 Monocryl.   The knee was cleaned, dried, dressed sterilely using Dermabond and   Aquacel dressing.  The patient was then   brought to recovery room in stable condition, tolerating the procedure   well.   Please note that Physician Assistant, Griffith Citron, PA-C was present for the entirety of the case, and was utilized for pre-operative positioning, peri-operative retractor management, general facilitation of the procedure and for primary wound closure at the end of the case.              Pietro Cassis Alvan Dame, M.D.    03/22/2019 8:53 AM

## 2019-03-23 ENCOUNTER — Encounter (HOSPITAL_COMMUNITY): Payer: Self-pay | Admitting: Orthopedic Surgery

## 2019-03-23 DIAGNOSIS — M1712 Unilateral primary osteoarthritis, left knee: Secondary | ICD-10-CM | POA: Diagnosis not present

## 2019-03-23 LAB — BASIC METABOLIC PANEL
Anion gap: 10 (ref 5–15)
BUN: 17 mg/dL (ref 8–23)
CO2: 23 mmol/L (ref 22–32)
Calcium: 8.3 mg/dL — ABNORMAL LOW (ref 8.9–10.3)
Chloride: 104 mmol/L (ref 98–111)
Creatinine, Ser: 0.79 mg/dL (ref 0.61–1.24)
GFR calc Af Amer: >60 mL/min (ref >60)
GFR calc non Af Amer: >60 mL/min (ref >60)
Glucose, Bld: 147 mg/dL — ABNORMAL HIGH (ref 70–99)
Potassium: 4.2 mmol/L (ref 3.5–5.1)
Sodium: 137 mmol/L (ref 135–145)

## 2019-03-23 LAB — CBC
HCT: 35.3 % — ABNORMAL LOW (ref 39.0–52.0)
Hemoglobin: 11.5 g/dL — ABNORMAL LOW (ref 13.0–17.0)
MCH: 32.7 pg (ref 26.0–34.0)
MCHC: 32.6 g/dL (ref 30.0–36.0)
MCV: 100.3 fL — ABNORMAL HIGH (ref 80.0–100.0)
Platelets: 223 10*3/uL (ref 150–400)
RBC: 3.52 MIL/uL — ABNORMAL LOW (ref 4.22–5.81)
RDW: 12.4 % (ref 11.5–15.5)
WBC: 12.4 10*3/uL — ABNORMAL HIGH (ref 4.0–10.5)
nRBC: 0 % (ref 0.0–0.2)

## 2019-03-23 NOTE — Progress Notes (Signed)
Physical Therapy Treatment Patient Details Name: Fernando Riley MRN: [id number] DOB: 05/04/1950 Today's Date: 03/23/2019    History of Present Illness Patient is 68 y.o. 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    PT Comments    Progressing well with mobility. Reviewed exercises, gait training, and stair training. Pt stated he has his HEP from last surgery in Oct 2020. Instructed him to perform exercises 2x/day. All education completed. Okay to d/c from PT standpoint.     Follow Up Recommendations  Follow surgeon's recommendation for DC plan and follow-up therapies;Supervision for mobility/OOB     Equipment Recommendations  None recommended by PT    Recommendations for Other Services       Precautions / Restrictions Precautions Precautions: Fall Restrictions Weight Bearing Restrictions: No LLE Weight Bearing: Weight bearing as tolerated    Mobility  Bed Mobility Overal bed mobility: Modified Independent                Transfers   Equipment used: Rolling walker (2 wheeled) Transfers: Sit to/from Stand Sit to Stand: Supervision            Ambulation/Gait Ambulation/Gait assistance: Supervision Gait Distance (Feet): 150 Feet Assistive device: Rolling walker (2 wheeled) Gait Pattern/deviations: Step-through pattern;Decreased stride length         Stairs Stairs: Yes Stairs assistance: Min guard Stair Management: Step to pattern;Forwards;Two rails Number of Stairs: 2 General stair comments: VCs safety, technique, sequence. Close guard for safety   Wheelchair Mobility    Modified Rankin (Stroke Patients Only)       Balance Overall balance assessment: Mild deficits observed, not formally tested                                          Cognition Arousal/Alertness: Awake/alert Behavior During Therapy: WFL for tasks assessed/performed Overall Cognitive Status: Within Functional Limits  for tasks assessed                                        Exercises Total Joint Exercises Ankle Circles/Pumps: AROM;Both;10 reps;Supine Quad Sets: AROM;Both;10 reps;Supine Heel Slides: AAROM;Left;10 reps;Supine Hip ABduction/ADduction: AROM;Left;10 reps;Supine Straight Leg Raises: AROM;Left;10 reps;Supine Long Arc Quad: AROM;Left;10 reps;Seated Knee Flexion: AROM;Left;10 reps;Seated Goniometric ROM: ~5-85 degrees    General Comments        Pertinent Vitals/Pain Pain Assessment: 0-10 Pain Score: 5  Pain Location: L knee Pain Descriptors / Indicators: Discomfort;Sore Pain Intervention(s): Monitored during session;Ice applied;Repositioned    Home Living                      Prior Function            PT Goals (current goals can now be found in the care plan section) Progress towards PT goals: Progressing toward goals    Frequency    7X/week      PT Plan Current plan remains appropriate    Co-evaluation              AM-PAC PT "6 Clicks" Mobility   Outcome Measure  Help needed turning from your back to your side while in a flat bed without using bedrails?: None Help needed moving from lying on your back to sitting on the  side of a flat bed without using bedrails?: None Help needed moving to and from a bed to a chair (including a wheelchair)?: A Little Help needed standing up from a chair using your arms (e.g., wheelchair or bedside chair)?: A Little Help needed to walk in hospital room?: A Little Help needed climbing 3-5 steps with a railing? : A Little 6 Click Score: 20    End of Session Equipment Utilized During Treatment: Gait belt Activity Tolerance: Patient tolerated treatment well Patient left: in bed;with call bell/phone within reach   PT Visit Diagnosis: Other abnormalities of gait and mobility (R26.89);Difficulty in walking, not elsewhere classified (R26.2)     Time: [id number] PT Time Calculation (min) (ACUTE ONLY):  19 min  Charges:  $Gait Training: 8-22 mins                        Weston Anna, PT Acute Rehabilitation Services Pager: [phone] Office: [phone]

## 2019-03-23 NOTE — Progress Notes (Signed)
     Subjective: 1 Day Post-Op Procedure(s) (LRB): TOTAL KNEE ARTHROPLASTY (Left)   Patient reports pain as mild, pain controlled with medication.  No reported events throughout the night.  We discussed the patient's procedure and expectations moving forward.  Patient ready be discharged home, if he does well therapy.  Patient follow-up in the clinic in 2 weeks.  Patient is to call with any questions or concerns.   Patient's anticipated LOS is less than 2 midnights, meeting these requirements: - Lives within 1 hour of care - Has a competent adult at home to recover with post-op recover - NO history of  - Chronic pain requiring opiods  - Diabetes  - Coronary Artery Disease  - Heart failure  - Heart attack  - Stroke  - DVT/VTE  - Cardiac arrhythmia  - Respiratory Failure/COPD  - Renal failure  - Anemia  - Advanced Liver disease     Objective:   VITALS:   Vitals:   03/23/19 0059 03/23/19 0549  BP: 133/71 130/70  Pulse: 72 77  Resp: 16 16  Temp: 97.8 F (36.6 C) 97.7 F (36.5 C)  SpO2: 95% 97%    Dorsiflexion/Plantar flexion intact Incision: dressing C/D/I No cellulitis present Compartment soft  LABS Recent Labs    03/21/19 0830 03/23/19 0251  HGB 13.8 11.5*  HCT 43.4 35.3*  WBC 7.4 12.4*  PLT 279 223    Recent Labs    03/21/19 0830 03/23/19 0251  NA 142 137  K 3.9 4.2  BUN 17 17  CREATININE 0.91 0.79  GLUCOSE 95 147*     Assessment/Plan: 1 Day Post-Op Procedure(s) (LRB): TOTAL KNEE ARTHROPLASTY (Left) Foley cath d/c'ed Advance diet Up with therapy D/C IV fluids Discharge home Follow up in 2 weeks at [facility] Follow up with OLIN,Esmirna Ravan D in 2 weeks.  Contact information:  [facility] [address] [place] [phone]    Obese (BMI 30-39.9) Estimated body mass index is 34.58 kg/m as calculated from the following:   Height as of this encounter: 6' (1.829 m).   Weight as of this  encounter: 115.7 kg. Patient also counseled that weight may inhibit the healing process Patient counseled that losing weight will help with future health issues       West Pugh. Shawonda Kerce   PAC  03/23/2019, 8:39 AM

## 2019-03-23 NOTE — Progress Notes (Signed)
Pt provided with d/c instructions. After discussing the pt's plan of care upon d/c home, the pt reported no further questions or concerns.  

## 2019-03-24 ENCOUNTER — Ambulatory Visit: Payer: Managed Care, Other (non HMO) | Admitting: Family Medicine

## 2019-03-29 NOTE — Discharge Summary (Signed)
Physician Discharge Summary  Patient ID: BENOIT POCIASK MRN: [id number] DOB/AGE: [birth year] 68 y.o.  Admit date: 03/22/2019 Discharge date: 03/23/2019   Procedures:  Procedure(s) (LRB): TOTAL KNEE ARTHROPLASTY (Left)  Attending Physician:  Dr. Paralee Cancel   Admission Diagnoses:   Left knee OA / pain  Discharge Diagnoses:  Principal Problem:   S/P left TKA  Past Medical History:  Diagnosis Date  . Arthritis   . Cat allergies   . Fatty liver   . History of kidney stones   . History of migraine   . Hypertension   . Impaired fasting glucose   . Melanoma ([place])    lower legs bilaterally  . OSA (obstructive sleep apnea)    unable to sleep with use of CPAP  . Peyronie disease   . Pollen allergies   . Reactive airways dysfunction syndrome ([place])   . Venous stasis     HPI:    Fernando Riley, 68 y.o. male, has a history of pain and functional disability in the left knee due to arthritis and has failed non-surgical conservative treatments for greater than 12 weeks to include NSAID's and/or analgesics, corticosteriod injections, viscosupplementation injections and activity modification.  Onset of symptoms was gradual, starting Aug 24, 2017 with gradually worsening course since that time. The patient noted prior procedures on the knee to include  arthroplasty on the right knee(s).  Patient currently rates pain in the left knee(s) at 6 out of 10 with activity. Patient has night pain, worsening of pain with activity and weight bearing, pain that interferes with activities of daily living, pain with passive range of motion, crepitus and joint swelling.  Patient has evidence of periarticular osteophytes and joint space narrowing by imaging studies.  There is no active infection.  Risks, benefits and expectations were discussed with the patient.  Risks including but not limited to the risk of anesthesia, blood clots, nerve damage, blood vessel damage, failure of the prosthesis, infection and up  to and including death.  Patient understand the risks, benefits and expectations and wishes to proceed with surgery.   PCP: Mikey Kirschner, MD   Discharged Condition: good  Hospital Course:  Patient underwent the above stated procedure on 03/22/2019. Patient tolerated the procedure well and brought to the recovery room in good condition and subsequently to the floor.  POD #1 BP: 130/70 ; Pulse: 77 ; Temp: 97.7 F (36.5 C) ; Resp: 16 Patient reports pain as mild, pain controlled with medication.  No reported events throughout the night.  We discussed the patient's procedure and expectations moving forward.  Patient ready be discharged home. Dorsiflexion/plantar flexion intact, incision: dressing C/D/I, no cellulitis present and compartment soft.   LABS  Basename    HGB     11.5  HCT     35.3    Discharge Exam: General appearance: alert, cooperative and no distress Extremities: Homans sign is negative, no sign of DVT, no edema, redness or tenderness in the calves or thighs and no ulcers, gangrene or trophic changes  Disposition:  Home with follow up in 2 weeks   Follow-up Information    Paralee Cancel, MD. Schedule an appointment as soon as possible for a visit in 2 weeks.   Specialty: Orthopedic Surgery Contact information: [address] [place] [address] [phone]           Discharge Instructions    Call MD / Call 911   Complete by: As directed  If you experience chest pain or shortness of breath, CALL 911 and be transported to the hospital emergency room.  If you develope a fever above 101 F, pus (white drainage) or increased drainage or redness at the wound, or calf pain, call your surgeon's office.   Call MD / Call 911   Complete by: As directed    If you experience chest pain or shortness of breath, CALL 911 and be transported to the hospital emergency room.  If you develope a fever above 101 F, pus (white drainage) or increased drainage or  redness at the wound, or calf pain, call your surgeon's office.   Change dressing   Complete by: As directed    Maintain surgical dressing until follow up in the clinic. If the edges start to pull up, may reinforce with tape. If the dressing is no longer working, may remove and cover with gauze and tape, but must keep the area dry and clean.  Call with any questions or concerns.   Change dressing   Complete by: As directed    Maintain surgical dressing until follow up in the clinic. If the edges start to pull up, may reinforce with tape. If the dressing is no longer working, may remove and cover with gauze and tape, but must keep the area dry and clean.  Call with any questions or concerns.   Constipation Prevention   Complete by: As directed    Drink plenty of fluids.  Prune juice may be helpful.  You may use a stool softener, such as Colace (over the counter) 100 mg twice a day.  Use MiraLax (over the counter) for constipation as needed.   Constipation Prevention   Complete by: As directed    Drink plenty of fluids.  Prune juice may be helpful.  You may use a stool softener, such as Colace (over the counter) 100 mg twice a day.  Use MiraLax (over the counter) for constipation as needed.   Diet - low sodium heart healthy   Complete by: As directed    Diet - low sodium heart healthy   Complete by: As directed    Discharge instructions   Complete by: As directed    Maintain surgical dressing until follow up in the clinic. If the edges start to pull up, may reinforce with tape. If the dressing is no longer working, may remove and cover with gauze and tape, but must keep the area dry and clean.  Follow up in 2 weeks at [facility]. Call with any questions or concerns.   Discharge instructions   Complete by: As directed    Maintain surgical dressing until follow up in the clinic. If the edges start to pull up, may reinforce with tape. If the dressing is no longer working, may remove and  cover with gauze and tape, but must keep the area dry and clean.  Follow up in 2 weeks at [facility]. Call with any questions or concerns.   Increase activity slowly as tolerated   Complete by: As directed    Weight bearing as tolerated with assist device (walker, cane, etc) as directed, use it as long as suggested by your surgeon or therapist, typically at least 4-6 weeks.   Increase activity slowly as tolerated   Complete by: As directed    Weight bearing as tolerated with assist device (walker, cane, etc) as directed, use it as long as suggested by your surgeon or therapist, typically at least 4-6 weeks.  TED hose   Complete by: As directed    Use stockings (TED hose) for 2 weeks on both leg(s).  You may remove them at night for sleeping.   TED hose   Complete by: As directed    Use stockings (TED hose) for 2 weeks on both leg(s).  You may remove them at night for sleeping.      Allergies as of 03/23/2019      Reactions   Other    Cats   Red Dye Hives   Pt's wife reported he gets hives with "food" red dye, but can take tablets that are red or pink      Medication List    STOP taking these medications   aspirin EC 81 MG tablet Replaced by: aspirin 81 MG chewable tablet     TAKE these medications   aspirin 81 MG chewable tablet Commonly known as: Aspirin Childrens Chew 1 tablet (81 mg total) by mouth 2 (two) times daily. Take for 4 weeks, then resume regular dose. Replaces: aspirin EC 81 MG tablet   docusate sodium 100 MG capsule Commonly known as: Colace Take 1 capsule (100 mg total) by mouth 2 (two) times daily.   enalapril 10 MG tablet Commonly known as: VASOTEC TAKE 1 TABLET EVERY DAY   ferrous sulfate 325 (65 FE) MG tablet Commonly known as: FerrouSul Take 1 tablet (325 mg total) by mouth 3 (three) times daily with meals for 14 days.   HYDROcodone-acetaminophen 7.5-325 MG tablet Commonly known as: Norco Take 1-2 tablets by mouth every 4 (four)  hours as needed for moderate pain.   methocarbamol 500 MG tablet Commonly known as: Robaxin Take 1 tablet (500 mg total) by mouth every 6 (six) hours as needed for muscle spasms.   polyethylene glycol 17 g packet Commonly known as: MIRALAX / GLYCOLAX Take 17 g by mouth 2 (two) times daily.   TURMERIC PO Take 1,000 mg by mouth daily.            Discharge Care Instructions  (From admission, onward)         Start     Ordered   03/23/19 0000  Change dressing    Comments: Maintain surgical dressing until follow up in the clinic. If the edges start to pull up, may reinforce with tape. If the dressing is no longer working, may remove and cover with gauze and tape, but must keep the area dry and clean.  Call with any questions or concerns.   03/23/19 0844   03/22/19 0000  Change dressing    Comments: Maintain surgical dressing until follow up in the clinic. If the edges start to pull up, may reinforce with tape. If the dressing is no longer working, may remove and cover with gauze and tape, but must keep the area dry and clean.  Call with any questions or concerns.   03/22/19 0756           Signed: West Pugh. Nettie Cromwell   PA-C  03/29/2019, 8:18 AM

## 2019-04-27 ENCOUNTER — Other Ambulatory Visit: Payer: Self-pay

## 2019-04-27 ENCOUNTER — Ambulatory Visit: Payer: Medicare Other | Attending: Internal Medicine

## 2019-04-27 DIAGNOSIS — Z20822 Contact with and (suspected) exposure to covid-19: Secondary | ICD-10-CM

## 2019-04-28 LAB — NOVEL CORONAVIRUS, NAA: SARS-CoV-2, NAA: DETECTED — AB

## 2019-04-29 ENCOUNTER — Telehealth: Payer: Self-pay | Admitting: Family Medicine

## 2019-04-29 NOTE — Telephone Encounter (Signed)
Pt set up CPE for June

## 2019-04-29 NOTE — Telephone Encounter (Signed)
Pt had complete physical June of this past summer so next not due. Also had chronic six mo visit in sept . If he wants just general follow up reassessment of his chronic problems due to OUR covid realities rec not before early feb since we are swamped day to day and we have already done his pe and chronic ck up.

## 2019-04-29 NOTE — Telephone Encounter (Signed)
Pt is wanting to know when he can schedule CPE with Dr. Richardson Landry. He started having COVID symptoms on 1/7 and was tested on 1/13 and it has come back positive.

## 2019-05-03 ENCOUNTER — Ambulatory Visit: Payer: Medicare Other | Admitting: Urology

## 2019-05-10 NOTE — Telephone Encounter (Signed)
Cal pt back and move his appt to mid may. Let him know we can handle all of his concerns that day and code it in a way his insur will cover. You can let him know i'm retiring if he wonders

## 2019-05-10 NOTE — Telephone Encounter (Signed)
Pt has been rescheduled. 

## 2019-05-10 NOTE — Telephone Encounter (Signed)
Please contact pt to reschedule. Thank you

## 2019-05-19 ENCOUNTER — Encounter: Payer: Self-pay | Admitting: Family Medicine

## 2019-06-14 ENCOUNTER — Other Ambulatory Visit: Payer: Self-pay

## 2019-06-14 ENCOUNTER — Ambulatory Visit: Payer: Medicare Other | Admitting: Urology

## 2019-06-14 ENCOUNTER — Ambulatory Visit (INDEPENDENT_AMBULATORY_CARE_PROVIDER_SITE_OTHER): Payer: Medicare Other | Admitting: Urology

## 2019-06-14 ENCOUNTER — Encounter: Payer: Self-pay | Admitting: Urology

## 2019-06-14 VITALS — BP 152/95 | HR 76 | Temp 97.5°F | Wt 255.0 lb

## 2019-06-14 DIAGNOSIS — N486 Induration penis plastica: Secondary | ICD-10-CM | POA: Diagnosis not present

## 2019-06-14 LAB — POCT URINALYSIS DIPSTICK
Blood, UA: NEGATIVE
Glucose, UA: NEGATIVE
Ketones, UA: NEGATIVE
Nitrite, UA: NEGATIVE
Protein, UA: NEGATIVE
Spec Grav, UA: >=1.03 — AB (ref 1.010–1.025)
Urobilinogen, UA: NEGATIVE E.U./dL — AB
pH, UA: 5 (ref 5.0–8.0)

## 2019-06-14 NOTE — Progress Notes (Signed)
H&P  Chief Complaint: Peyronie's Disease  History of Present Illness:   3.2.2021: Here today for annual follow-up. Over the last year, he reports that his curvature has noticeably improve -- he does think this will never fully straighten. He continues to intermittently use sildenafil for management of his associated ED.   Additionally, he believes to have passed a kidney stone this last summer. He was symptomatic for less than 24 hrs. He has not yet made any efforts to limit his risk for stone formation/growth.  (below copied from [facility] records):  Peyronie's Disease:  Fernando Riley is a 69 year-old male established patient who is here for penile curvature or pain with erections.  He does have penile curvature with his erections. He has had penile curvature with his erections for 8 months. His penis curves upward and to the right. His penile curvature is based in the penile base and proximal 1/3.   He has noticed a knot on his penis. He does have penile narrowing with his erections. His symptoms of Peyronie's Disease do have an impact on intercourse.   9.3.2019: Sx's began ~ 9 mos previous. No penile injuries. No more penile pain. He can penetrate but has early detumescence. He was informed about natural course of Peyronie's disease and was given a scrip for sidenafil.   12.17.2019: He has not used the sildenafil. The erections are no worse, curvature has not changed. He is having no pain with erections.   Past Medical History:  Diagnosis Date  . Arthritis   . Cat allergies   . Fatty liver   . History of kidney stones   . History of migraine   . Hypertension   . Impaired fasting glucose   . Melanoma ([place])    lower legs bilaterally  . OSA (obstructive sleep apnea)    unable to sleep with use of CPAP  . Peyronie disease   . Pollen allergies   . Reactive airways dysfunction syndrome ([place])   . Venous stasis     Past Surgical History:  Procedure Laterality Date  . COLONOSCOPY N/A  01/21/2016   Procedure: COLONOSCOPY;  Surgeon: Daneil Dolin, MD;  [facility];  Service: Endoscopy;  Laterality: N/A;  1:00 PM - moved to 12:30 - office notified per Tretha Sciara  . KNEE ARTHROSCOPY Bilateral   . POLYPECTOMY  01/21/2016   Procedure: POLYPECTOMY;  Surgeon: Daneil Dolin, MD;  [facility];  Service: Endoscopy;;  colon  . SHOULDER ARTHROSCOPY Left   . SHOULDER SURGERY Right 2016  . TOTAL KNEE ARTHROPLASTY Right 01/25/2019   Procedure: TOTAL KNEE ARTHROPLASTY;  Surgeon: Paralee Cancel, MD;  [facility];  Service: Orthopedics;  Laterality: Right;  70 mins  . TOTAL KNEE ARTHROPLASTY Left 03/22/2019   Procedure: TOTAL KNEE ARTHROPLASTY;  Surgeon: Paralee Cancel, MD;  Location: [facility];  Service: Orthopedics;  Laterality: Left;  70 mins    Home Medications:  Allergies as of 06/14/2019      Reactions   Other    Cats   Red Dye Hives   Pt's wife reported he gets hives with "food" red dye, but can take tablets that are red or pink      Medication List       Accurate as of June 14, 2019  4:17 PM. If you have any questions, ask your nurse or doctor.        STOP taking these medications   docusate sodium 100 MG capsule Commonly known as:  Colace Stopped by: Jorja Loa, MD   ferrous sulfate 325 (65 FE) MG tablet Commonly known as: FerrouSul Stopped by: Jorja Loa, MD   methocarbamol 500 MG tablet Commonly known as: Robaxin Stopped by: Jorja Loa, MD   polyethylene glycol 17 g packet Commonly known as: MIRALAX / GLYCOLAX Stopped by: Jorja Loa, MD     TAKE these medications   aspirin EC 81 MG tablet Take 81 mg by mouth daily.   enalapril 10 MG tablet Commonly known as: VASOTEC TAKE 1 TABLET EVERY DAY   HYDROcodone-acetaminophen 7.5-325 MG tablet Commonly known as: Norco Take 1-2 tablets by mouth every 4 (four) hours as needed for moderate pain.   TURMERIC PO Take 1,000 mg by mouth daily.        Allergies:  Allergies  Allergen Reactions  . Other     Cats  . Red Dye Hives    Pt's wife reported he gets hives with "food" red dye, but can take tablets that are red or pink    Family History  Problem Relation Age of Onset  . Heart disease Father   . Diabetes Brother   . Hyperlipidemia Brother     Social History:  reports that he has never smoked. He has never used smokeless tobacco. He reports that he does not drink alcohol or use drugs.  ROS: A complete review of systems was performed.  All systems are negative except for pertinent findings as noted.  Physical Exam:  Vital signs in last 24 hours: BP (!) 152/95   Pulse 76   Temp (!) 97.5 F (36.4 C)   Wt 255 lb (115.7 kg)   BMI 34.58 kg/m  Constitutional:  Alert and oriented, No acute distress. Obese. Cardiovascular: Regular rate  Respiratory: Normal respiratory effort GI: Abdomen is soft, nontender, nondistended, no abdominal masses. No CVAT. No hernias.  Genitourinary: Normal male phallus, testes are descended bilaterally and non-tender and without masses, scrotum is normal in appearance without lesions or masses, perineum is normal on inspection. Prostate feels around 40 grams. Lymphatic: No lymphadenopathy Neurologic: Grossly intact, no focal deficits Psychiatric: Normal mood and affect  Laboratory Data:  No results for input(s): WBC, HGB, HCT, PLT in the last 72 hours.  No results for input(s): NA, K, CL, GLUCOSE, BUN, CALCIUM, CREATININE in the last 72 hours.  Invalid input(s): CO3   Results for orders placed or performed in visit on 06/14/19 (from the past 24 hour(s))  POCT urinalysis dipstick     Status: Abnormal   Collection Time: 06/14/19  3:49 PM  Result Value Ref Range   Color, UA yellow    Clarity, UA clear    Glucose, UA Negative Negative   Bilirubin, UA moderate    Ketones, UA neg    Spec Grav, UA >=1.030 (A) 1.010 - 1.025   Blood, UA neg    pH, UA 5.0 5.0 - 8.0   Protein, UA Negative  Negative   Urobilinogen, UA negative (A) 0.2 or 1.0 E.U./dL   Nitrite, UA neg    Leukocytes, UA Trace (A) Negative   Appearance clear    Odor      I have reviewed prior pt notes  I have reviewed notes from referring/previous physicians  I have reviewed urinalysis results  I have reviewed prior PSA results (6.12.2020--0.3)  Impression/Assessment:  He reports that his peyronie's is actually improving. His ED is well managed with sildenafil. No new complaints regarding his PD. DRE today is normal.  Only 2-3 stones in last year that were passed without issue.   Plan:  1. Advised on general stone prevention/management -- decrease sodium, increase fluids, and add citrus to water  2. He is fine to continue to use sildenafil as desired for management of his PD-associated erectile dysfunction.   3. Return for OV in 1 yr.   4. Continue to have his PSA checked per his PCP.

## 2019-08-02 ENCOUNTER — Other Ambulatory Visit: Payer: Self-pay | Admitting: *Deleted

## 2019-08-02 MED ORDER — ENALAPRIL MALEATE 10 MG PO TABS: 10.0000 mg | ORAL_TABLET | Freq: Every day | ORAL | 0 refills | Status: DC

## 2019-08-11 ENCOUNTER — Telehealth: Payer: Self-pay | Admitting: *Deleted

## 2019-08-11 NOTE — Telephone Encounter (Signed)
Needing more info about enalapril, who prescribed?  Is it new or an inc in dosage?  Thx,   Dr. Lovena Le

## 2019-08-11 NOTE — Telephone Encounter (Signed)
Left message to return call with pt to see if he is having any reaction. Form at nurse station to fax back to pharm after talking with pt.

## 2019-08-11 NOTE — Telephone Encounter (Signed)
Fax from [facility] stating patient reports red dye allergy and there is a potential for cross-sensitivity. Please confirm if ok to fill this med. Form in dr taylor's folder to review.

## 2019-08-11 NOTE — Telephone Encounter (Signed)
Which med is he asking about?  Thx,   Dr. Lovena Le

## 2019-08-11 NOTE — Telephone Encounter (Signed)
Dr Richardson Landry prescribed and pt has been on enalapril 10mg  since 06/23/2013

## 2019-08-11 NOTE — Telephone Encounter (Signed)
Sorry Dr. Lovena Le it was enalapril

## 2019-08-11 NOTE — Telephone Encounter (Signed)
Continue with med if not having any reactions.    Thanks,   Dr. Lovena Le

## 2019-08-12 NOTE — Telephone Encounter (Signed)
Left message to return call 

## 2019-08-18 NOTE — Telephone Encounter (Signed)
Pt states he has been on for years and not having any allergic reaction to it. Called [facility] and discussed with pharm.

## 2019-08-22 ENCOUNTER — Ambulatory Visit: Payer: Medicare Other | Admitting: Family Medicine

## 2019-08-29 ENCOUNTER — Other Ambulatory Visit: Payer: Self-pay

## 2019-08-29 ENCOUNTER — Ambulatory Visit (INDEPENDENT_AMBULATORY_CARE_PROVIDER_SITE_OTHER): Payer: Medicare Other | Admitting: Family Medicine

## 2019-08-29 ENCOUNTER — Encounter: Payer: Self-pay | Admitting: Family Medicine

## 2019-08-29 VITALS — BP 136/84 | Temp 98.3°F | Ht 72.0 in | Wt 263.0 lb

## 2019-08-29 DIAGNOSIS — I10 Essential (primary) hypertension: Secondary | ICD-10-CM

## 2019-08-29 DIAGNOSIS — Z125 Encounter for screening for malignant neoplasm of prostate: Secondary | ICD-10-CM | POA: Diagnosis not present

## 2019-08-29 DIAGNOSIS — Z0000 Encounter for general adult medical examination without abnormal findings: Secondary | ICD-10-CM | POA: Diagnosis not present

## 2019-08-29 DIAGNOSIS — Z79899 Other long term (current) drug therapy: Secondary | ICD-10-CM

## 2019-08-29 DIAGNOSIS — Z13220 Encounter for screening for lipoid disorders: Secondary | ICD-10-CM | POA: Diagnosis not present

## 2019-08-29 NOTE — Progress Notes (Signed)
   Subjective:    Patient ID: Fernando Riley, male    DOB: 1951-03-14, 69 y.o.   MRN: [id number]  HPI AWV- Annual Wellness Visit  The patient was seen for their annual wellness visit. The patient's past medical history, surgical history, and family history were reviewed. Pertinent vaccines were reviewed ( tetanus, pneumonia, shingles, flu) The patient's medication list was reviewed and updated.  The height and weight were entered.  BMI recorded in electronic record elsewhere  Cognitive screening was completed. Outcome of Mini - Cog: pass   Falls /depression screening electronically recorded within record elsewhere  Current tobacco usage: none (All patients who use tobacco were given written and verbal information on quitting)  Recent listing of emergency department/hospitalizations over the past year were reviewed.  current specialist the patient sees on a regular basis: none   Medicare annual wellness visit patient questionnaire was reviewed.  A written screening schedule for the patient for the next 5-10 years was given. Appropriate discussion of followup regarding next visit was discussed.    Had covid back in jan, fatigue and no taste  Blood pressure medicine and blood pressure levels reviewed today with patient. Compliant with blood pressure medicine. States does not miss a dose. No obvious side effects. Blood pressure generally good when checked elsewhere. Watching salt intake.   Working on all the exercises  Needs  b w     Review of Systems No headache, no major weight loss or weight gain, no chest pain no back pain abdominal pain no change in bowel habits complete ROS otherwise negative     Objective:   Physical Exam Vitals reviewed.  Constitutional:      Appearance: He is well-developed.  HENT:     Head: Normocephalic and atraumatic.     Right Ear: External ear normal.     Left Ear: External ear normal.     Nose: Nose normal.  Eyes:     Pupils: Pupils  are equal, round, and reactive to light.  Neck:     Thyroid: No thyromegaly.  Cardiovascular:     Rate and Rhythm: Normal rate and regular rhythm.     Heart sounds: Normal heart sounds. No murmur.  Pulmonary:     Effort: Pulmonary effort is normal. No respiratory distress.     Breath sounds: Normal breath sounds. No wheezing.  Abdominal:     General: Bowel sounds are normal. There is no distension.     Palpations: Abdomen is soft. There is no mass.     Tenderness: There is no abdominal tenderness.  Genitourinary:    Penis: Normal.   Musculoskeletal:        General: Normal range of motion.     Cervical back: Normal range of motion and neck supple.  Lymphadenopathy:     Cervical: No cervical adenopathy.  Skin:    General: Skin is warm and dry.     Findings: No erythema.  Neurological:     Mental Status: He is alert.     Motor: No abnormal muscle tone.  Psychiatric:        Behavior: Behavior normal.        Judgment: Judgment normal.           Assessment & Plan:  Impression #1 wellness. slkeptical re covid vaccine, has already had Covid.  Diet discussed.  Exercise discussed.  Compliant with medications.  Will maintain same dose of blood pressure medication.  Follow-up in 6 months.  Appropriate blood work ordered

## 2019-09-06 LAB — LIPID PANEL
Chol/HDL Ratio: 3.1 ratio (ref 0.0–5.0)
Cholesterol, Total: 179 mg/dL (ref 100–199)
HDL: 57 mg/dL (ref >39)
LDL Chol Calc (NIH): 104 mg/dL — ABNORMAL HIGH (ref 0–99)
Triglycerides: 101 mg/dL (ref 0–149)
VLDL Cholesterol Cal: 18 mg/dL (ref 5–40)

## 2019-09-06 LAB — CBC WITH DIFFERENTIAL/PLATELET
Basophils Absolute: 0.1 10*3/uL (ref 0.0–0.2)
Basos: 1 %
EOS (ABSOLUTE): 0.3 10*3/uL (ref 0.0–0.4)
Eos: 5 %
Hematocrit: 42.8 % (ref 37.5–51.0)
Hemoglobin: 14.3 g/dL (ref 13.0–17.7)
Immature Grans (Abs): 0 10*3/uL (ref 0.0–0.1)
Immature Granulocytes: 0 %
Lymphocytes Absolute: 1.9 10*3/uL (ref 0.7–3.1)
Lymphs: 34 %
MCH: 32 pg (ref 26.6–33.0)
MCHC: 33.4 g/dL (ref 31.5–35.7)
MCV: 96 fL (ref 79–97)
Monocytes Absolute: 0.5 10*3/uL (ref 0.1–0.9)
Monocytes: 9 %
Neutrophils Absolute: 2.9 10*3/uL (ref 1.4–7.0)
Neutrophils: 51 %
Platelets: 264 10*3/uL (ref 150–450)
RBC: 4.47 x10E6/uL (ref 4.14–5.80)
RDW: 12.4 % (ref 11.6–15.4)
WBC: 5.7 10*3/uL (ref 3.4–10.8)

## 2019-09-06 LAB — BASIC METABOLIC PANEL
BUN/Creatinine Ratio: 15 (ref 10–24)
BUN: 18 mg/dL (ref 8–27)
CO2: 20 mmol/L (ref 20–29)
Calcium: 9.4 mg/dL (ref 8.6–10.2)
Chloride: 105 mmol/L (ref 96–106)
Creatinine, Ser: 1.2 mg/dL (ref 0.76–1.27)
GFR calc Af Amer: 72 mL/min/{1.73_m2} (ref >59)
GFR calc non Af Amer: 62 mL/min/{1.73_m2} (ref >59)
Glucose: 103 mg/dL — ABNORMAL HIGH (ref 65–99)
Potassium: 4.3 mmol/L (ref 3.5–5.2)
Sodium: 141 mmol/L (ref 134–144)

## 2019-09-06 LAB — HEPATIC FUNCTION PANEL
ALT: 14 IU/L (ref 0–44)
AST: 16 IU/L (ref 0–40)
Albumin: 4.2 g/dL (ref 3.8–4.8)
Alkaline Phosphatase: 92 IU/L (ref 48–121)
Bilirubin Total: 0.7 mg/dL (ref 0.0–1.2)
Bilirubin, Direct: 0.17 mg/dL (ref 0.00–0.40)
Total Protein: 6.4 g/dL (ref 6.0–8.5)

## 2019-09-06 LAB — PSA: Prostate Specific Ag, Serum: 0.2 ng/mL (ref 0.0–4.0)

## 2019-09-10 ENCOUNTER — Encounter: Payer: Self-pay | Admitting: Family Medicine

## 2019-09-26 ENCOUNTER — Ambulatory Visit: Payer: Medicare Other | Admitting: Family Medicine

## 2019-10-12 ENCOUNTER — Other Ambulatory Visit: Payer: Self-pay | Admitting: Family Medicine

## 2020-01-09 ENCOUNTER — Ambulatory Visit (INDEPENDENT_AMBULATORY_CARE_PROVIDER_SITE_OTHER): Payer: Medicare Other | Admitting: Urology

## 2020-01-09 ENCOUNTER — Telehealth: Payer: Self-pay

## 2020-01-09 ENCOUNTER — Encounter: Payer: Self-pay | Admitting: Urology

## 2020-01-09 ENCOUNTER — Other Ambulatory Visit: Payer: Self-pay | Admitting: Urology

## 2020-01-09 ENCOUNTER — Other Ambulatory Visit: Payer: Self-pay

## 2020-01-09 ENCOUNTER — Ambulatory Visit (HOSPITAL_COMMUNITY)
Admission: RE | Admit: 2020-01-09 | Discharge: 2020-01-09 | Disposition: A | Discharge status: Home / Self Care | Payer: Medicare Other | Source: Ambulatory Visit | Attending: Urology | Admitting: Urology

## 2020-01-09 VITALS — BP 124/78 | HR 67 | Temp 98.6°F | Ht 72.0 in | Wt 263.0 lb

## 2020-01-09 DIAGNOSIS — R1011 Right upper quadrant pain: Secondary | ICD-10-CM | POA: Diagnosis not present

## 2020-01-09 DIAGNOSIS — N200 Calculus of kidney: Secondary | ICD-10-CM

## 2020-01-09 MED ORDER — OXYCODONE-ACETAMINOPHEN 5-325 MG PO TABS: 1.0000 | ORAL_TABLET | ORAL | 0 refills | Status: DC | PRN

## 2020-01-09 MED ORDER — ONDANSETRON 4 MG PO TBDP: 4.0000 mg | ORAL_TABLET | Freq: Three times a day (TID) | ORAL | 0 refills | Status: DC | PRN

## 2020-01-09 MED ORDER — TAMSULOSIN HCL 0.4 MG PO CAPS: 0.4000 mg | ORAL_CAPSULE | Freq: Every day | ORAL | 0 refills | Status: DC

## 2020-01-09 NOTE — Progress Notes (Signed)
Urological Symptom Review  Patient is experiencing the following symptoms: Get up at night to urinate Blood in urine   Review of Systems  Gastrointestinal (upper)  : Negative for upper GI symptoms  Gastrointestinal (lower) : Negative for lower GI symptoms  Constitutional : Negative for symptoms  Skin: Negative for skin symptoms  Eyes: Negative for eye symptoms  Ear/Nose/Throat : Negative for Ear/Nose/Throat symptoms  Hematologic/Lymphatic: Negative for Hematologic/Lymphatic symptoms  Cardiovascular : Negative for cardiovascular symptoms  Respiratory : Negative for respiratory symptoms  Endocrine: Negative for endocrine symptoms  Musculoskeletal: Negative for musculoskeletal symptoms  Neurological: Negative for neurological symptoms  Psychologic: Negative for psychiatric symptoms

## 2020-01-09 NOTE — H&P (View-Only) (Signed)
01/09/2020 2:17 PM   Fernando Riley 1951-03-30 [id number]  Referring provider: No referring provider defined for this encounter.  Nephrolithiasis  HPI: Fernando Riley is a 69yo here for evaluation for nephrolithiasis. Yesterday he developed severe left flank pain with associated nausea and vomiting. He underwent CT stone study which showed bilaterla renal calculi and 2 distal left ureteral calculi. Currently the pain is mild and controlled with narcotics. He denies any nausea or vomiting currently. No worsening LUTS   PMH: Past Medical History:  Diagnosis Date  . Arthritis   . Cat allergies   . Fatty liver   . History of kidney stones   . History of migraine   . Hypertension   . Impaired fasting glucose   . Kidney stone   . Melanoma ([place])    lower legs bilaterally  . OSA (obstructive sleep apnea)    unable to sleep with use of CPAP  . Peyronie disease   . Pollen allergies   . Reactive airways dysfunction syndrome ([place])   . Venous stasis     Surgical History: Past Surgical History:  Procedure Laterality Date  . COLONOSCOPY N/A 01/21/2016   Procedure: COLONOSCOPY;  Surgeon: Daneil Dolin, MD;  [facility];  Service: Endoscopy;  Laterality: N/A;  1:00 PM - moved to 12:30 - office notified per Tretha Sciara  . KNEE ARTHROSCOPY Bilateral   . POLYPECTOMY  01/21/2016   Procedure: POLYPECTOMY;  Surgeon: Daneil Dolin, MD;  [facility];  Service: Endoscopy;;  colon  . SHOULDER ARTHROSCOPY Left   . SHOULDER SURGERY Right 2016  . TOTAL KNEE ARTHROPLASTY Right 01/25/2019   Procedure: TOTAL KNEE ARTHROPLASTY;  Surgeon: Paralee Cancel, MD;  [facility];  Service: Orthopedics;  Laterality: Right;  70 mins  . TOTAL KNEE ARTHROPLASTY Left 03/22/2019   Procedure: TOTAL KNEE ARTHROPLASTY;  Surgeon: Paralee Cancel, MD;  [facility];  Service: Orthopedics;  Laterality: Left;  70 mins    Home Medications:  Allergies as of 01/09/2020      Reactions   Other    Cats     Red Dye Hives   Pt's wife reported he gets hives with "food" red dye, but can take tablets that are red or pink      Medication List       Accurate as of January 09, 2020  2:17 PM. If you have any questions, ask your nurse or doctor.        aspirin EC 81 MG tablet Take 81 mg by mouth daily.   CeleBREX 200 MG capsule Generic drug: celecoxib Celebrex 200 mg capsule  1po qd   enalapril 10 MG tablet Commonly known as: VASOTEC TAKE 1 TABLET EVERY DAY   TURMERIC PO Take 1,000 mg by mouth daily.   Tylenol 325 MG Caps Generic drug: Acetaminophen Tylenol       Allergies:  Allergies  Allergen Reactions  . Other     Cats  . Red Dye Hives    Pt's wife reported he gets hives with "food" red dye, but can take tablets that are red or pink    Family History: Family History  Problem Relation Age of Onset  . Heart disease Father   . Diabetes Brother   . Hyperlipidemia Brother     Social History:  reports that he has never smoked. He has never used smokeless tobacco. He reports that he does not drink alcohol and does not use drugs.  ROS: All other  review of systems were reviewed and are negative except what is noted above in HPI  Physical Exam: BP 124/78   Pulse 67   Temp 98.6 F (37 C)   Ht 6' (1.829 m)   Wt 263 lb (119.3 kg)   BMI 35.67 kg/m   Constitutional:  Alert and oriented, No acute distress. HEENT: [place] AT, moist mucus membranes.  Trachea midline, no masses. Cardiovascular: No clubbing, cyanosis, or edema. Respiratory: Normal respiratory effort, no increased work of breathing. GI: Abdomen is soft, nontender, nondistended, no abdominal masses GU: No CVA tenderness.  Lymph: No cervical or inguinal lymphadenopathy. Skin: No rashes, bruises or suspicious lesions. Neurologic: Grossly intact, no focal deficits, moving all 4 extremities. Psychiatric: Normal mood and affect.  Laboratory Data: Lab Results  Component Value Date   WBC 5.7 09/05/2019   HGB  14.3 09/05/2019   HCT 42.8 09/05/2019   MCV 96 09/05/2019   PLT 264 09/05/2019    Lab Results  Component Value Date   CREATININE 1.20 09/05/2019    Lab Results  Component Value Date   PSA 0.2 07/17/2014   PSA 0.31 07/11/2013    No results found for: TESTOSTERONE  No results found for: HGBA1C  Urinalysis    Component Value Date/Time   BILIRUBINUR moderate 06/14/2019 1549   PROTEINUR Negative 06/14/2019 1549   UROBILINOGEN negative (A) 06/14/2019 1549   NITRITE neg 06/14/2019 1549   LEUKOCYTESUR Trace (A) 06/14/2019 1549    No results found for: LABMICR, WBCUA, RBCUA, LABEPIT, MUCUS, BACTERIA  Pertinent Imaging: CT stone study, today: Images reviewed and discussed with the patient No results found for this or any previous visit.  No results found for this or any previous visit.  No results found for this or any previous visit.  No results found for this or any previous visit.  No results found for this or any previous visit.  No results found for this or any previous visit.  No results found for this or any previous visit.  Results for orders placed during the hospital encounter of 01/09/20  CT RENAL STONE STUDY  Narrative CLINICAL DATA:  Left-sided flank pain for several days  EXAM: CT ABDOMEN AND PELVIS WITHOUT CONTRAST  TECHNIQUE: Multidetector CT imaging of the abdomen and pelvis was performed following the standard protocol without IV contrast.  COMPARISON:  12/09/2007  FINDINGS: Lower chest: No acute abnormality.  Hepatobiliary: No focal liver abnormality is seen. No gallstones, gallbladder wall thickening, or biliary dilatation.  Pancreas: Unremarkable. No pancreatic ductal dilatation or surrounding inflammatory changes.  Spleen: Normal in size without focal abnormality.  Adrenals/Urinary Tract: Adrenal glands are within normal limits. Kidneys are well visualized bilaterally with bilateral renal calculi increased in size and number  when compared with the prior exam. The largest of these on the right measures 4 mm in the upper pole. Largest of these on the left measures 6 mm in the midportion of the left kidney. Mild fullness of the collecting system and left ureter is seen. Some Peri ureteral stranding is noted. Two distal left ureteral stones are noted. The dominant stone measures approximately 6 mm. The smaller adjacent stone measures 1-2 mm. This is best visualized on image number 81 of series 6. The bladder is decompressed.  Stomach/Bowel: The appendix is within normal limits. Colon shows no obstructive or inflammatory changes. Small bowel and stomach appear within normal limits as well.  Vascular/Lymphatic: Aortic atherosclerosis. No enlarged abdominal or pelvic lymph nodes.  Reproductive: Prostate is unremarkable.  Other: No abdominal wall hernia or abnormality. No abdominopelvic ascites.  Musculoskeletal: No acute or significant osseous findings.  IMPRESSION: Two distal left ureteral stones as described above causing mild hydronephrosis and hydroureter.  Bilateral nonobstructing renal stones as described.   Electronically Signed By: Inez Catalina M.D. On: 01/09/2020 09:44   Assessment & Plan:    1. Nephrolithiasis -We discussed the management of kidney stones. These options include observation, ureteroscopy, shockwave lithotripsy (ESWL) and percutaneous nephrolithotomy (PCNL). We discussed which options are relevant to the patient's stone(s). We discussed the natural history of kidney stones as well as the complications of untreated stones and the impact on quality of life without treatment as well as with each of the above listed treatments. We also discussed the efficacy of each treatment in its ability to clear the stone burden. With any of these management options I discussed the signs and symptoms of infection and the need for emergent treatment should these be experienced. For each option we  discussed the ability of each procedure to clear the patient of their stone burden.   For observation I described the risks which include but are not limited to silent renal damage, life-threatening infection, need for emergent surgery, failure to pass stone and pain.   For ureteroscopy I described the risks which include bleeding, infection, damage to contiguous structures, positioning injury, ureteral stricture, ureteral avulsion, ureteral injury, need for prolonged ureteral stent, inability to perform ureteroscopy, need for an interval procedure, inability to clear stone burden, stent discomfort/pain, heart attack, stroke, pulmonary embolus and the inherent risks with general anesthesia.   For shockwave lithotripsy I described the risks which include arrhythmia, kidney contusion, kidney hemorrhage, need for transfusion, pain, inability to adequately break up stone, inability to pass stone fragments, Steinstrasse, infection associated with obstructing stones, need for alternate surgical procedure, need for repeat shockwave lithotripsy, MI, CVA, PE and the inherent risks with anesthesia/conscious sedation.   For PCNL I described the risks including positioning injury, pneumothorax, hydrothorax, need for chest tube, inability to clear stone burden, renal laceration, arterial venous fistula or malformation, need for embolization of kidney, loss of kidney or renal function, need for repeat procedure, need for prolonged nephrostomy tube, ureteral avulsion, MI, CVA, PE and the inherent risks of general anesthesia.   - The patient would like to proceed with medical expulsive therapy. Patient will call if he desires ESWL   No follow-ups on file.  Nicolette Bang, MD  [facility] [place]

## 2020-01-09 NOTE — Patient Instructions (Signed)

## 2020-01-09 NOTE — Progress Notes (Signed)
01/09/2020 2:17 PM   Fernando Riley [birth year] [id number]  Referring provider: No referring provider defined for this encounter.  Nephrolithiasis  HPI: Fernando Riley is a 69yo here for evaluation for nephrolithiasis. Yesterday he developed severe left flank pain with associated nausea and vomiting. He underwent CT stone study which showed bilaterla renal calculi and 2 distal left ureteral calculi. Currently the pain is mild and controlled with narcotics. He denies any nausea or vomiting currently. No worsening LUTS   PMH: Past Medical History:  Diagnosis Date  . Arthritis   . Cat allergies   . Fatty liver   . History of kidney stones   . History of migraine   . Hypertension   . Impaired fasting glucose   . Kidney stone   . Melanoma ([place])    lower legs bilaterally  . OSA (obstructive sleep apnea)    unable to sleep with use of CPAP  . Peyronie disease   . Pollen allergies   . Reactive airways dysfunction syndrome ([place])   . Venous stasis     Surgical History: Past Surgical History:  Procedure Laterality Date  . COLONOSCOPY N/A 01/21/2016   Procedure: COLONOSCOPY;  Surgeon: Daneil Dolin, MD;  Location: [facility];  Service: Endoscopy;  Laterality: N/A;  1:00 PM - moved to 12:30 - office notified per Tretha Sciara  . KNEE ARTHROSCOPY Bilateral   . POLYPECTOMY  01/21/2016   Procedure: POLYPECTOMY;  Surgeon: Daneil Dolin, MD;  Location: [facility];  Service: Endoscopy;;  colon  . SHOULDER ARTHROSCOPY Left   . SHOULDER SURGERY Right 2016  . TOTAL KNEE ARTHROPLASTY Right 01/25/2019   Procedure: TOTAL KNEE ARTHROPLASTY;  Surgeon: Paralee Cancel, MD;  Location: [facility];  Service: Orthopedics;  Laterality: Right;  70 mins  . TOTAL KNEE ARTHROPLASTY Left 03/22/2019   Procedure: TOTAL KNEE ARTHROPLASTY;  Surgeon: Paralee Cancel, MD;  Location: [facility];  Service: Orthopedics;  Laterality: Left;  70 mins    Home Medications:  Allergies as of 01/09/2020      Reactions   Other    Cats     Red Dye Hives   Pt's wife reported he gets hives with "food" red dye, but can take tablets that are red or pink      Medication List       Accurate as of January 09, 2020  2:17 PM. If you have any questions, ask your nurse or doctor.        aspirin EC 81 MG tablet Take 81 mg by mouth daily.   CeleBREX 200 MG capsule Generic drug: celecoxib Celebrex 200 mg capsule  1po qd   enalapril 10 MG tablet Commonly known as: VASOTEC TAKE 1 TABLET EVERY DAY   TURMERIC PO Take 1,000 mg by mouth daily.   Tylenol 325 MG Caps Generic drug: Acetaminophen Tylenol       Allergies:  Allergies  Allergen Reactions  . Other     Cats  . Red Dye Hives    Pt's wife reported he gets hives with "food" red dye, but can take tablets that are red or pink    Family History: Family History  Problem Relation Age of Onset  . Heart disease Father   . Diabetes Brother   . Hyperlipidemia Brother     Social History:  reports that he has never smoked. He has never used smokeless tobacco. He reports that he does not drink alcohol and does not use drugs.  ROS: All other  review of systems were reviewed and are negative except what is noted above in HPI  Physical Exam: BP 124/78   Pulse 67   Temp 98.6 F (37 C)   Ht 6' (1.829 m)   Wt 263 lb (119.3 kg)   BMI 35.67 kg/m   Constitutional:  Alert and oriented, No acute distress. HEENT:  AT, moist mucus membranes.  Trachea midline, no masses. Cardiovascular: No clubbing, cyanosis, or edema. Respiratory: Normal respiratory effort, no increased work of breathing. GI: Abdomen is soft, nontender, nondistended, no abdominal masses GU: No CVA tenderness.  Lymph: No cervical or inguinal lymphadenopathy. Skin: No rashes, bruises or suspicious lesions. Neurologic: Grossly intact, no focal deficits, moving all 4 extremities. Psychiatric: Normal mood and affect.  Laboratory Data: Lab Results  Component Value Date   WBC 5.7 09/05/2019   HGB  14.3 09/05/2019   HCT 42.8 09/05/2019   MCV 96 09/05/2019   PLT 264 09/05/2019    Lab Results  Component Value Date   CREATININE 1.20 09/05/2019    Lab Results  Component Value Date   PSA 0.2 07/17/2014   PSA 0.31 07/11/2013    No results found for: TESTOSTERONE  No results found for: HGBA1C  Urinalysis    Component Value Date/Time   BILIRUBINUR moderate 06/14/2019 1549   PROTEINUR Negative 06/14/2019 1549   UROBILINOGEN negative (A) 06/14/2019 1549   NITRITE neg 06/14/2019 1549   LEUKOCYTESUR Trace (A) 06/14/2019 1549    No results found for: LABMICR, WBCUA, RBCUA, LABEPIT, MUCUS, BACTERIA  Pertinent Imaging: CT stone study, today: Images reviewed and discussed with the patient No results found for this or any previous visit.  No results found for this or any previous visit.  No results found for this or any previous visit.  No results found for this or any previous visit.  No results found for this or any previous visit.  No results found for this or any previous visit.  No results found for this or any previous visit.  Results for orders placed during the hospital encounter of 01/09/20  CT RENAL STONE STUDY  Narrative CLINICAL DATA:  Left-sided flank pain for several days  EXAM: CT ABDOMEN AND PELVIS WITHOUT CONTRAST  TECHNIQUE: Multidetector CT imaging of the abdomen and pelvis was performed following the standard protocol without IV contrast.  COMPARISON:  12/09/2007  FINDINGS: Lower chest: No acute abnormality.  Hepatobiliary: No focal liver abnormality is seen. No gallstones, gallbladder wall thickening, or biliary dilatation.  Pancreas: Unremarkable. No pancreatic ductal dilatation or surrounding inflammatory changes.  Spleen: Normal in size without focal abnormality.  Adrenals/Urinary Tract: Adrenal glands are within normal limits. Kidneys are well visualized bilaterally with bilateral renal calculi increased in size and number  when compared with the prior exam. The largest of these on the right measures 4 mm in the upper pole. Largest of these on the left measures 6 mm in the midportion of the left kidney. Mild fullness of the collecting system and left ureter is seen. Some Peri ureteral stranding is noted. Two distal left ureteral stones are noted. The dominant stone measures approximately 6 mm. The smaller adjacent stone measures 1-2 mm. This is best visualized on image number 81 of series 6. The bladder is decompressed.  Stomach/Bowel: The appendix is within normal limits. Colon shows no obstructive or inflammatory changes. Small bowel and stomach appear within normal limits as well.  Vascular/Lymphatic: Aortic atherosclerosis. No enlarged abdominal or pelvic lymph nodes.  Reproductive: Prostate is unremarkable.  Other: No abdominal wall hernia or abnormality. No abdominopelvic ascites.  Musculoskeletal: No acute or significant osseous findings.  IMPRESSION: Two distal left ureteral stones as described above causing mild hydronephrosis and hydroureter.  Bilateral nonobstructing renal stones as described.   Electronically Signed By: Inez Catalina M.D. On: 01/09/2020 09:44   Assessment & Plan:    1. Nephrolithiasis -We discussed the management of kidney stones. These options include observation, ureteroscopy, shockwave lithotripsy (ESWL) and percutaneous nephrolithotomy (PCNL). We discussed which options are relevant to the patient's stone(s). We discussed the natural history of kidney stones as well as the complications of untreated stones and the impact on quality of life without treatment as well as with each of the above listed treatments. We also discussed the efficacy of each treatment in its ability to clear the stone burden. With any of these management options I discussed the signs and symptoms of infection and the need for emergent treatment should these be experienced. For each option we  discussed the ability of each procedure to clear the patient of their stone burden.   For observation I described the risks which include but are not limited to silent renal damage, life-threatening infection, need for emergent surgery, failure to pass stone and pain.   For ureteroscopy I described the risks which include bleeding, infection, damage to contiguous structures, positioning injury, ureteral stricture, ureteral avulsion, ureteral injury, need for prolonged ureteral stent, inability to perform ureteroscopy, need for an interval procedure, inability to clear stone burden, stent discomfort/pain, heart attack, stroke, pulmonary embolus and the inherent risks with general anesthesia.   For shockwave lithotripsy I described the risks which include arrhythmia, kidney contusion, kidney hemorrhage, need for transfusion, pain, inability to adequately break up stone, inability to pass stone fragments, Steinstrasse, infection associated with obstructing stones, need for alternate surgical procedure, need for repeat shockwave lithotripsy, MI, CVA, PE and the inherent risks with anesthesia/conscious sedation.   For PCNL I described the risks including positioning injury, pneumothorax, hydrothorax, need for chest tube, inability to clear stone burden, renal laceration, arterial venous fistula or malformation, need for embolization of kidney, loss of kidney or renal function, need for repeat procedure, need for prolonged nephrostomy tube, ureteral avulsion, MI, CVA, PE and the inherent risks of general anesthesia.   - The patient would like to proceed with medical expulsive therapy. Patient will call if he desires ESWL   No follow-ups on file.  Nicolette Bang, MD  [facility] [place]

## 2020-01-09 NOTE — Telephone Encounter (Signed)
Pts drt or spouse called saying pt having nausea, dry heaves and back pain 4 days. Said pt felt just like before w/ prior stone. Called over weekend and told to go to ER but did not. This is a [facility] pt but Dr. Alyson Ingles agreed to see today. Ordered stat CT and made appt for 1:30 today.

## 2020-01-09 NOTE — Addendum Note (Signed)
Addended by: Cleon Gustin on: 01/09/2020 09:07 AM   Modules accepted: Orders

## 2020-01-11 LAB — URINE CULTURE

## 2020-01-12 ENCOUNTER — Other Ambulatory Visit: Payer: Self-pay

## 2020-01-12 DIAGNOSIS — N200 Calculus of kidney: Secondary | ICD-10-CM

## 2020-01-12 NOTE — Progress Notes (Signed)
Sent via mychart

## 2020-01-13 ENCOUNTER — Other Ambulatory Visit: Payer: Self-pay

## 2020-01-13 ENCOUNTER — Encounter (HOSPITAL_COMMUNITY)
Admission: RE | Admit: 2020-01-13 | Discharge: 2020-01-13 | Disposition: A | Discharge status: Home / Self Care | Payer: Medicare Other | Source: Ambulatory Visit | Attending: Urology | Admitting: Urology

## 2020-01-16 ENCOUNTER — Other Ambulatory Visit (HOSPITAL_COMMUNITY)
Admission: RE | Admit: 2020-01-16 | Discharge: 2020-01-16 | Disposition: A | Discharge status: Home / Self Care | Payer: Medicare Other | Source: Ambulatory Visit | Attending: Urology | Admitting: Urology

## 2020-01-16 ENCOUNTER — Other Ambulatory Visit: Payer: Self-pay

## 2020-01-16 DIAGNOSIS — Z20822 Contact with and (suspected) exposure to covid-19: Secondary | ICD-10-CM | POA: Insufficient documentation

## 2020-01-16 DIAGNOSIS — Z01812 Encounter for preprocedural laboratory examination: Secondary | ICD-10-CM | POA: Diagnosis present

## 2020-01-16 LAB — SARS CORONAVIRUS 2 (TAT 6-24 HRS): SARS Coronavirus 2: NEGATIVE

## 2020-01-17 ENCOUNTER — Encounter (HOSPITAL_COMMUNITY): Payer: Self-pay | Admitting: Urology

## 2020-01-17 ENCOUNTER — Ambulatory Visit (HOSPITAL_COMMUNITY)
Admission: RE | Admit: 2020-01-17 | Discharge: 2020-01-17 | Disposition: A | Discharge status: Home / Self Care | Payer: Medicare Other | Attending: Urology | Admitting: Urology

## 2020-01-17 ENCOUNTER — Ambulatory Visit (HOSPITAL_COMMUNITY): Payer: Medicare Other

## 2020-01-17 ENCOUNTER — Encounter (HOSPITAL_COMMUNITY)
Admission: RE | Disposition: A | Discharge status: Home / Self Care | Payer: Self-pay | Source: Home / Self Care | Attending: Urology

## 2020-01-17 DIAGNOSIS — Z7982 Long term (current) use of aspirin: Secondary | ICD-10-CM | POA: Insufficient documentation

## 2020-01-17 DIAGNOSIS — Z85820 Personal history of malignant melanoma of skin: Secondary | ICD-10-CM | POA: Insufficient documentation

## 2020-01-17 DIAGNOSIS — M1990 Unspecified osteoarthritis, unspecified site: Secondary | ICD-10-CM | POA: Diagnosis not present

## 2020-01-17 DIAGNOSIS — Z96653 Presence of artificial knee joint, bilateral: Secondary | ICD-10-CM | POA: Diagnosis not present

## 2020-01-17 DIAGNOSIS — Z79899 Other long term (current) drug therapy: Secondary | ICD-10-CM | POA: Diagnosis not present

## 2020-01-17 DIAGNOSIS — I10 Essential (primary) hypertension: Secondary | ICD-10-CM | POA: Diagnosis not present

## 2020-01-17 DIAGNOSIS — Z8249 Family history of ischemic heart disease and other diseases of the circulatory system: Secondary | ICD-10-CM | POA: Insufficient documentation

## 2020-01-17 DIAGNOSIS — N200 Calculus of kidney: Secondary | ICD-10-CM | POA: Diagnosis present

## 2020-01-17 DIAGNOSIS — G4733 Obstructive sleep apnea (adult) (pediatric): Secondary | ICD-10-CM | POA: Diagnosis not present

## 2020-01-17 DIAGNOSIS — K760 Fatty (change of) liver, not elsewhere classified: Secondary | ICD-10-CM | POA: Diagnosis not present

## 2020-01-17 HISTORY — PX: EXTRACORPOREAL SHOCK WAVE LITHOTRIPSY: SHX1557

## 2020-01-17 SURGERY — LITHOTRIPSY, ESWL
Anesthesia: LOCAL | Laterality: Left

## 2020-01-17 MED ORDER — DIPHENHYDRAMINE HCL 25 MG PO CAPS
25.0000 mg | ORAL_CAPSULE | Freq: Once | ORAL | Status: AC
  Administered 2020-01-17: 25 mg via ORAL
  Filled 2020-01-17: qty 1

## 2020-01-17 MED ORDER — DIAZEPAM 5 MG PO TABS
10.0000 mg | ORAL_TABLET | Freq: Once | ORAL | Status: AC
  Administered 2020-01-17: 10 mg via ORAL
  Filled 2020-01-17: qty 2

## 2020-01-17 MED ORDER — TAMSULOSIN HCL 0.4 MG PO CAPS: ORAL_CAPSULE | ORAL | 3 refills | Status: DC

## 2020-01-17 MED ORDER — ONDANSETRON 4 MG PO TBDP: 4.0000 mg | ORAL_TABLET | Freq: Three times a day (TID) | ORAL | 0 refills | Status: DC | PRN

## 2020-01-17 MED ORDER — OXYCODONE-ACETAMINOPHEN 5-325 MG PO TABS
1.0000 | ORAL_TABLET | ORAL | 0 refills | Status: DC | PRN
Start: 2020-01-17 — End: 2020-01-25

## 2020-01-17 MED ORDER — SODIUM CHLORIDE 0.9 % IV SOLN
INTRAVENOUS | Status: DC
  Administered 2020-01-17: 1000 mL via INTRAVENOUS

## 2020-01-17 NOTE — Discharge Instructions (Signed)
Lithotripsy, Care After This sheet gives you information about how to care for yourself after your procedure. Your health care provider may also give you more specific instructions. If you have problems or questions, contact your health care provider. What can I expect after the procedure? After the procedure, it is common to have:  Some blood in your urine. This should only last for a few days.  Soreness in your back, sides, or upper abdomen for a few days.  Blotches or bruises on your back where the pressure wave entered the skin.  Pain, discomfort, or nausea when pieces (fragments) of the kidney stone move through the tube that carries urine from the kidney to the bladder (ureter). Stone fragments may pass soon after the procedure, but they may continue to pass for up to 4-8 weeks. ? If you have severe pain or nausea, contact your health care provider. This may be caused by a large stone that was not broken up, and this may mean that you need more treatment.  Some pain or discomfort during urination.  Some pain or discomfort in the lower abdomen or (in men) at the base of the penis. Follow these instructions at home: Medicines  Take over-the-counter and prescription medicines only as told by your health care provider.  If you were prescribed an antibiotic medicine, take it as told by your health care provider. Do not stop taking the antibiotic even if you start to feel better.  Do not drive for 24 hours if you were given a medicine to help you relax (sedative).  Do not drive or use heavy machinery while taking prescription pain medicine. Eating and drinking      Drink enough water and fluids to keep your urine clear or pale yellow. This helps any remaining pieces of the stone to pass. It can also help prevent new stones from forming.  Eat plenty of fresh fruits and vegetables.  Follow instructions from your health care provider about eating and drinking restrictions. You may be  instructed: ? To reduce how much salt (sodium) you eat or drink. Check ingredients and nutrition facts on packaged foods and beverages. ? To reduce how much meat you eat.  Eat the recommended amount of calcium for your age and gender. Ask your health care provider how much calcium you should have. General instructions  Get plenty of rest.  Most people can resume normal activities 1-2 days after the procedure. Ask your health care provider what activities are safe for you.  Your health care provider may direct you to lie in a certain position (postural drainage) and tap firmly (percuss) over your kidney area to help stone fragments pass. Follow instructions as told by your health care provider.  If directed, strain all urine through the strainer that was provided by your health care provider. ? Keep all fragments for your health care provider to see. Any stones that are found may be sent to a medical lab for examination. The stone may be as small as a grain of salt.  Keep all follow-up visits as told by your health care provider. This is important. Contact a health care provider if:  You have pain that is severe or does not get better with medicine.  You have nausea that is severe or does not go away.  You have blood in your urine longer than your health care provider told you to expect.  You have more blood in your urine.  You have pain during urination that does   not go away.  You urinate more frequently than usual and this does not go away.  You develop a rash or any other possible signs of an allergic reaction. Get help right away if:  You have severe pain in your back, sides, or upper abdomen.  You have severe pain while urinating.  Your urine is very dark red.  You have blood in your stool (feces).  You cannot pass any urine at all.  You feel a strong urge to urinate after emptying your bladder.  You have a fever or chills.  You develop shortness of breath,  difficulty breathing, or chest pain.  You have severe nausea that leads to persistent vomiting.  You faint. Summary  After this procedure, it is common to have some pain, discomfort, or nausea when pieces (fragments) of the kidney stone move through the tube that carries urine from the kidney to the bladder (ureter). If this pain or nausea is severe, however, you should contact your health care provider.  Most people can resume normal activities 1-2 days after the procedure. Ask your health care provider what activities are safe for you.  Drink enough water and fluids to keep your urine clear or pale yellow. This helps any remaining pieces of the stone to pass, and it can help prevent new stones from forming.  If directed, strain your urine and keep all fragments for your health care provider to see. Fragments or stones may be as small as a grain of salt.  Get help right away if you have severe pain in your back, sides, or upper abdomen or have severe pain while urinating. This information is not intended to replace advice given to you by your health care provider. Make sure you discuss any questions you have with your health care provider. Document Revised: 07/12/2018 Document Reviewed: 02/20/2016 Elsevier Patient Education  2020 [employer]  

## 2020-01-17 NOTE — Interval H&P Note (Signed)
History and Physical Interval Note:  01/17/2020 9:16 AM  Fernando Riley  has presented today for surgery, with the diagnosis of left ureteral calculus.  The various methods of treatment have been discussed with the patient and family. After consideration of risks, benefits and other options for treatment, the patient has consented to  Procedure(s): EXTRACORPOREAL SHOCK WAVE LITHOTRIPSY (ESWL) (Left) as a surgical intervention.  The patient's history has been reviewed, patient examined, no change in status, stable for surgery.  I have reviewed the patient's chart and labs.  Questions were answered to the patient's satisfaction.     Nicolette Bang

## 2020-01-18 ENCOUNTER — Encounter (HOSPITAL_COMMUNITY): Payer: Self-pay | Admitting: Urology

## 2020-01-19 LAB — CALCULI, WITH PHOTOGRAPH (CLINICAL LAB)
Calcium Oxalate Dihydrate: 10 %
Calcium Oxalate Monohydrate: 90 %
Weight Calculi: 123 mg

## 2020-01-19 LAB — MICROSCOPIC EXAMINATION
RBC, Urine: >30 /hpf — AB (ref 0–2)
Renal Epithel, UA: NONE SEEN /hpf
WBC, UA: NONE SEEN /hpf (ref 0–5)

## 2020-01-19 LAB — URINALYSIS, ROUTINE W REFLEX MICROSCOPIC
Bilirubin, UA: NEGATIVE
Glucose, UA: NEGATIVE
Leukocytes,UA: NEGATIVE
Nitrite, UA: NEGATIVE
Specific Gravity, UA: >1.03 — ABNORMAL HIGH (ref 1.005–1.030)
Urobilinogen, Ur: 1 mg/dL (ref 0.2–1.0)
pH, UA: 5 (ref 5.0–7.5)

## 2020-01-19 LAB — SPECIMEN STATUS REPORT

## 2020-01-25 ENCOUNTER — Encounter: Payer: Self-pay | Admitting: Family Medicine

## 2020-01-25 ENCOUNTER — Ambulatory Visit (INDEPENDENT_AMBULATORY_CARE_PROVIDER_SITE_OTHER): Payer: Medicare Other | Admitting: Family Medicine

## 2020-01-25 ENCOUNTER — Ambulatory Visit: Payer: Medicare Other | Admitting: Urology

## 2020-01-25 VITALS — BP 130/80 | HR 81 | Temp 98.2°F | Ht 72.0 in | Wt 266.0 lb

## 2020-01-25 DIAGNOSIS — I10 Essential (primary) hypertension: Secondary | ICD-10-CM | POA: Diagnosis not present

## 2020-01-25 DIAGNOSIS — Z125 Encounter for screening for malignant neoplasm of prostate: Secondary | ICD-10-CM

## 2020-01-25 DIAGNOSIS — N200 Calculus of kidney: Secondary | ICD-10-CM | POA: Diagnosis not present

## 2020-01-25 MED ORDER — ENALAPRIL MALEATE 10 MG PO TABS
10.0000 mg | ORAL_TABLET | Freq: Every day | ORAL | 1 refills | Status: DC
Start: 2020-01-25 — End: 2020-03-12

## 2020-01-25 NOTE — Progress Notes (Signed)
Patient ID: Fernando Riley, male    DOB: [birth year], 69 y.o.   MRN: [id number]   Chief Complaint  Patient presents with  . Hypertension   Subjective:    HPI  Seen to est care.   Has h/o kidney stones. Has had 3 stones recently. Wondering if drinking too much caffeine and not hydrating enough. Seeing Dr. Glade Riley, Urology. Doing lithotripsy.  H/o HTN-  Usually running 120/70s.  No chest pain, sob, lower leg swelling, headache or dizziness. Taking enalapril and pt is compliant.  Medical History Fernando Riley has a past medical history of Arthritis, Cat allergies, Fatty liver, History of kidney stones, History of migraine, Hypertension, Impaired fasting glucose, Kidney stone, Melanoma ([place]), OSA (obstructive sleep apnea), Peyronie disease, Pollen allergies, Reactive airways dysfunction syndrome ([place]), and Venous stasis.   Outpatient Encounter Medications as of 01/25/2020  Medication Sig  . aspirin EC 81 MG tablet Take 81 mg by mouth daily.  . enalapril (VASOTEC) 10 MG tablet Take 1 tablet (10 mg total) by mouth daily.  . tamsulosin (FLOMAX) 0.4 MG CAPS capsule TAKE 1 CAPSULE(0.4 MG) BY MOUTH DAILY  . Turmeric 500 MG CAPS Take 1,000 mg by mouth daily.   . [DISCONTINUED] enalapril (VASOTEC) 10 MG tablet TAKE 1 TABLET EVERY DAY (Patient taking differently: Take 10 mg by mouth daily. )  . [DISCONTINUED] ondansetron (ZOFRAN ODT) 4 MG disintegrating tablet Take 1 tablet (4 mg total) by mouth every 8 (eight) hours as needed for nausea or vomiting.  . [DISCONTINUED] oxyCODONE-acetaminophen (PERCOCET) 5-325 MG tablet Take 1 tablet by mouth every 4 (four) hours as needed for moderate pain or severe pain.   No facility-administered encounter medications on file as of 01/25/2020.     Review of Systems  Constitutional: Negative for chills and fever.  HENT: Negative for congestion, rhinorrhea and sore throat.   Respiratory: Negative for cough, shortness of breath and wheezing.     Cardiovascular: Negative for chest pain and leg swelling.  Gastrointestinal: Negative for abdominal pain, diarrhea, nausea and vomiting.  Genitourinary: Negative for dysuria and frequency.  Skin: Negative for rash.  Neurological: Negative for dizziness, weakness and headaches.     Vitals BP 130/80   Pulse 81   Temp 98.2 F (36.8 C) (Oral)   Ht 6' (1.829 m)   Wt 266 lb (120.7 kg)   SpO2 96%   BMI 36.08 kg/m   Objective:   Physical Exam Vitals and nursing note reviewed.  Constitutional:      General: He is not in acute distress.    Appearance: Normal appearance. He is not ill-appearing.  Cardiovascular:     Rate and Rhythm: Normal rate and regular rhythm.     Pulses: Normal pulses.     Heart sounds: Normal heart sounds.  Pulmonary:     Effort: Pulmonary effort is normal. No respiratory distress.     Breath sounds: Normal breath sounds.  Musculoskeletal:        General: Normal range of motion.  Skin:    General: Skin is warm and dry.     Findings: No rash.  Neurological:     General: No focal deficit present.     Mental Status: He is alert and oriented to person, place, and time.     Cranial Nerves: No cranial nerve deficit.  Psychiatric:        Mood and Affect: Mood normal.        Behavior: Behavior normal.        Thought  Content: Thought content normal.        Judgment: Judgment normal.      Assessment and Plan   1. Essential hypertension, benign - CBC; Future - CMP14+EGFR; Future - Lipid panel; Future - Lipid panel - CMP14+EGFR - CBC  2. Nephrolithiasis  3. Screening PSA (prostate specific antigen) - PSA; Future - PSA   htn- stable. Cont meds. Cont to watch salt in diet. On recheck bp -130/80.  H/o kidney stones- cont to monitor and f/u urology.   F/u 83mofor amwv.

## 2020-01-30 ENCOUNTER — Ambulatory Visit (HOSPITAL_COMMUNITY)
Admission: RE | Admit: 2020-01-30 | Discharge: 2020-01-30 | Disposition: A | Discharge status: Home / Self Care | Payer: Medicare Other | Source: Ambulatory Visit | Attending: Urology | Admitting: Urology

## 2020-01-30 ENCOUNTER — Other Ambulatory Visit: Payer: Self-pay

## 2020-01-30 DIAGNOSIS — N200 Calculus of kidney: Secondary | ICD-10-CM | POA: Insufficient documentation

## 2020-01-31 ENCOUNTER — Encounter: Payer: Self-pay | Admitting: Urology

## 2020-01-31 ENCOUNTER — Ambulatory Visit (INDEPENDENT_AMBULATORY_CARE_PROVIDER_SITE_OTHER): Payer: Medicare Other | Admitting: Urology

## 2020-01-31 ENCOUNTER — Other Ambulatory Visit: Payer: Self-pay

## 2020-01-31 VITALS — BP 131/75 | HR 80 | Temp 98.6°F | Ht 72.0 in | Wt 266.0 lb

## 2020-01-31 DIAGNOSIS — N200 Calculus of kidney: Secondary | ICD-10-CM

## 2020-01-31 LAB — URINALYSIS, ROUTINE W REFLEX MICROSCOPIC
Bilirubin, UA: NEGATIVE
Glucose, UA: NEGATIVE
Leukocytes,UA: NEGATIVE
Nitrite, UA: NEGATIVE
RBC, UA: NEGATIVE
Specific Gravity, UA: 1.025 (ref 1.005–1.030)
Urobilinogen, Ur: 1 mg/dL (ref 0.2–1.0)
pH, UA: 5.5 (ref 5.0–7.5)

## 2020-01-31 NOTE — Progress Notes (Signed)
Urological Symptom Review ° °Patient is experiencing the following symptoms: °Get up at night to urinate °Kidney stones ° °Review of Systems ° °Gastrointestinal (upper)  : °Negative for upper GI symptoms ° °Gastrointestinal (lower) : °Negative for lower GI symptoms ° °Constitutional : °Negative for symptoms ° °Skin: °Negative for skin symptoms ° °Eyes: °Negative for eye symptoms ° °Ear/Nose/Throat : °Negative for Ear/Nose/Throat symptoms ° °Hematologic/Lymphatic: °Negative for Hematologic/Lymphatic symptoms ° °Cardiovascular : °Negative for cardiovascular symptoms ° °Respiratory : °Negative for respiratory symptoms ° °Endocrine: °Negative for endocrine symptoms ° °Musculoskeletal: °Negative for musculoskeletal symptoms ° °Neurological: °Negative for neurological symptoms ° °Psychologic: °Negative for psychiatric symptoms °

## 2020-01-31 NOTE — H&P (View-Only) (Signed)
01/31/2020 1:23 PM   Fernando Riley [birth year] [id number]  Referring provider: No referring provider defined for this encounter.  Nephrolithiasis  HPI: Fernando Riley is a 69yo here for followup for nephrolithiasis. He underwent ESWL 2 weeks ago for a left renal calculus. KUB from today shows very tiny residula left renal calculi 45mm. He has a 6-56mm right upper pole calculus.   PMH: Past Medical History:  Diagnosis Date  . Arthritis   . Cat allergies   . Fatty liver   . History of kidney stones   . History of migraine   . Hypertension   . Impaired fasting glucose   . Kidney stone   . Melanoma ([place])    lower legs bilaterally  . OSA (obstructive sleep apnea)    unable to sleep with use of CPAP  . Peyronie disease   . Pollen allergies   . Reactive airways dysfunction syndrome ([place])   . Venous stasis     Surgical History: Past Surgical History:  Procedure Laterality Date  . COLONOSCOPY N/A 01/21/2016   Procedure: COLONOSCOPY;  Surgeon: Daneil Dolin, MD;  [facility];  Service: Endoscopy;  Laterality: N/A;  1:00 PM - moved to 12:30 - office notified per Tretha Sciara  . EXTRACORPOREAL SHOCK WAVE LITHOTRIPSY Left 01/17/2020   Procedure: EXTRACORPOREAL SHOCK WAVE LITHOTRIPSY (ESWL);  Surgeon: Cleon Gustin, MD;  Location: [facility];  Service: Urology;  Laterality: Left;  . KNEE ARTHROSCOPY Bilateral   . POLYPECTOMY  01/21/2016   Procedure: POLYPECTOMY;  Surgeon: Daneil Dolin, MD;  [facility];  Service: Endoscopy;;  colon  . SHOULDER ARTHROSCOPY Left   . SHOULDER SURGERY Right 2016  . TOTAL KNEE ARTHROPLASTY Right 01/25/2019   Procedure: TOTAL KNEE ARTHROPLASTY;  Surgeon: Paralee Cancel, MD;  [facility]: [facility];  Service: Orthopedics;  Laterality: Right;  70 mins  . TOTAL KNEE ARTHROPLASTY Left 03/22/2019   Procedure: TOTAL KNEE ARTHROPLASTY;  Surgeon: Paralee Cancel, MD;  [facility];  Service: Orthopedics;  Laterality: Left;  70 mins    Home  Medications:  Allergies as of 01/31/2020      Reactions   Other    Cats   Red Dye Hives   Pt's wife reported he gets hives with "food" red dye, but can take tablets that are red or pink.       Medication List       Accurate as of January 31, 2020  1:23 PM. If you have any questions, ask your nurse or doctor.        aspirin EC 81 MG tablet Take 81 mg by mouth daily.   enalapril 10 MG tablet Commonly known as: VASOTEC Take 1 tablet (10 mg total) by mouth daily.   tamsulosin 0.4 MG Caps capsule Commonly known as: FLOMAX TAKE 1 CAPSULE(0.4 MG) BY MOUTH DAILY   Turmeric 500 MG Caps Take 1,000 mg by mouth daily.       Allergies:  Allergies  Allergen Reactions  . Other     Cats  . Red Dye Hives    Pt's wife reported he gets hives with "food" red dye, but can take tablets that are red or pink.     Family History: Family History  Problem Relation Age of Onset  . Heart disease Father   . Diabetes Brother   . Hyperlipidemia Brother     Social History:  reports that he has never smoked. He has never used smokeless tobacco. He reports that  he does not drink alcohol and does not use drugs.  ROS: All other review of systems were reviewed and are negative except what is noted above in HPI  Physical Exam: BP 131/75   Pulse 80   Temp 98.6 F (37 C)   Ht 6' (1.829 m)   Wt 266 lb (120.7 kg)   BMI 36.08 kg/m   Constitutional:  Alert and oriented, No acute distress. HEENT: [place] AT, moist mucus membranes.  Trachea midline, no masses. Cardiovascular: No clubbing, cyanosis, or edema. Respiratory: Normal respiratory effort, no increased work of breathing. GI: Abdomen is soft, nontender, nondistended, no abdominal masses GU: No CVA tenderness.  Lymph: No cervical or inguinal lymphadenopathy. Skin: No rashes, bruises or suspicious lesions. Neurologic: Grossly intact, no focal deficits, moving all 4 extremities. Psychiatric: Normal mood and affect.  Laboratory Data: Lab  Results  Component Value Date   WBC 5.7 09/05/2019   HGB 14.3 09/05/2019   HCT 42.8 09/05/2019   MCV 96 09/05/2019   PLT 264 09/05/2019    Lab Results  Component Value Date   CREATININE 1.20 09/05/2019    Lab Results  Component Value Date   PSA 0.2 07/17/2014   PSA 0.31 07/11/2013    No results found for: TESTOSTERONE  No results found for: HGBA1C  Urinalysis    Component Value Date/Time   APPEARANCEUR Clear 01/09/2020 1549   GLUCOSEU Negative 01/09/2020 1549   BILIRUBINUR Negative 01/09/2020 1549   PROTEINUR 2+ (A) 01/09/2020 1549   UROBILINOGEN negative (A) 06/14/2019 1549   NITRITE Negative 01/09/2020 1549   LEUKOCYTESUR Negative 01/09/2020 1549    Lab Results  Component Value Date   LABMICR See below: 01/09/2020   WBCUA None seen 01/09/2020   LABEPIT 0-10 01/09/2020   MUCUS Present 01/09/2020   BACTERIA Many (A) 01/09/2020    Pertinent Imaging: KUB yesterday: Images reviewed and discussed with the patient Results for orders placed during the hospital encounter of 01/30/20  DG Abd 1 View  Narrative CLINICAL DATA:  Post lithotripsy  EXAM: ABDOMEN - 1 VIEW  COMPARISON:  01/17/2020, CT 01/09/2020  FINDINGS: Small right greater than left intrarenal stones, measuring up to 4 mm on the right and 4 mm on the left. Phleboliths in the left pelvis. The previously noted distal ureteral stone on the left on comparison CT is not seen on today's radiograph.  IMPRESSION: 1. The previously noted distal left ureteral stone on comparison CT is not seen on today's radiograph. 2. Bilateral intrarenal stones.   Electronically Signed By: Donavan Foil M.D. On: 01/30/2020 21:26  No results found for this or any previous visit.  No results found for this or any previous visit.  No results found for this or any previous visit.  No results found for this or any previous visit.  No results found for this or any previous visit.  No results found for this or  any previous visit.  Results for orders placed during the hospital encounter of 01/09/20  CT RENAL STONE STUDY  Narrative CLINICAL DATA:  Left-sided flank pain for several days  EXAM: CT ABDOMEN AND PELVIS WITHOUT CONTRAST  TECHNIQUE: Multidetector CT imaging of the abdomen and pelvis was performed following the standard protocol without IV contrast.  COMPARISON:  12/09/2007  FINDINGS: Lower chest: No acute abnormality.  Hepatobiliary: No focal liver abnormality is seen. No gallstones, gallbladder wall thickening, or biliary dilatation.  Pancreas: Unremarkable. No pancreatic ductal dilatation or surrounding inflammatory changes.  Spleen: Normal in size without  focal abnormality.  Adrenals/Urinary Tract: Adrenal glands are within normal limits. Kidneys are well visualized bilaterally with bilateral renal calculi increased in size and number when compared with the prior exam. The largest of these on the right measures 4 mm in the upper pole. Largest of these on the left measures 6 mm in the midportion of the left kidney. Mild fullness of the collecting system and left ureter is seen. Some Peri ureteral stranding is noted. Two distal left ureteral stones are noted. The dominant stone measures approximately 6 mm. The smaller adjacent stone measures 1-2 mm. This is best visualized on image number 81 of series 6. The bladder is decompressed.  Stomach/Bowel: The appendix is within normal limits. Colon shows no obstructive or inflammatory changes. Small bowel and stomach appear within normal limits as well.  Vascular/Lymphatic: Aortic atherosclerosis. No enlarged abdominal or pelvic lymph nodes.  Reproductive: Prostate is unremarkable.  Other: No abdominal wall hernia or abnormality. No abdominopelvic ascites.  Musculoskeletal: No acute or significant osseous findings.  IMPRESSION: Two distal left ureteral stones as described above causing mild hydronephrosis and  hydroureter.  Bilateral nonobstructing renal stones as described.   Electronically Signed By: Inez Catalina M.D. On: 01/09/2020 09:44   Assessment & Plan:    1. Right Nephrolithiasis -We discussed the management of kidney stones. These options include observation, ureteroscopy, shockwave lithotripsy (ESWL) and percutaneous nephrolithotomy (PCNL). We discussed which options are relevant to the patient's stone(s). We discussed the natural history of kidney stones as well as the complications of untreated stones and the impact on quality of life without treatment as well as with each of the above listed treatments. We also discussed the efficacy of each treatment in its ability to clear the stone burden. With any of these management options I discussed the signs and symptoms of infection and the need for emergent treatment should these be experienced. For each option we discussed the ability of each procedure to clear the patient of their stone burden.   For observation I described the risks which include but are not limited to silent renal damage, life-threatening infection, need for emergent surgery, failure to pass stone and pain.   For ureteroscopy I described the risks which include bleeding, infection, damage to contiguous structures, positioning injury, ureteral stricture, ureteral avulsion, ureteral injury, need for prolonged ureteral stent, inability to perform ureteroscopy, need for an interval procedure, inability to clear stone burden, stent discomfort/pain, heart attack, stroke, pulmonary embolus and the inherent risks with general anesthesia.   For shockwave lithotripsy I described the risks which include arrhythmia, kidney contusion, kidney hemorrhage, need for transfusion, pain, inability to adequately break up stone, inability to pass stone fragments, Steinstrasse, infection associated with obstructing stones, need for alternate surgical procedure, need for repeat shockwave  lithotripsy, MI, CVA, PE and the inherent risks with anesthesia/conscious sedation.   For PCNL I described the risks including positioning injury, pneumothorax, hydrothorax, need for chest tube, inability to clear stone burden, renal laceration, arterial venous fistula or malformation, need for embolization of kidney, loss of kidney or renal function, need for repeat procedure, need for prolonged nephrostomy tube, ureteral avulsion, MI, CVA, PE and the inherent risks of general anesthesia.   - The patient would like to proceed with Right ESWL - Urinalysis, Routine w reflex microscopic   No follow-ups on file.  Nicolette Bang, MD  [facility] [place]

## 2020-01-31 NOTE — Progress Notes (Signed)
01/31/2020 1:23 PM   Fernando Riley May 11, 1950 [id number]  Referring provider: No referring provider defined for this encounter.  Nephrolithiasis  HPI: Fernando Riley is a 69yo here for followup for nephrolithiasis. He underwent ESWL 2 weeks ago for a left renal calculus. KUB from today shows very tiny residula left renal calculi 41mm. He has a 6-31mm right upper pole calculus.   PMH: Past Medical History:  Diagnosis Date  . Arthritis   . Cat allergies   . Fatty liver   . History of kidney stones   . History of migraine   . Hypertension   . Impaired fasting glucose   . Kidney stone   . Melanoma ([place])    lower legs bilaterally  . OSA (obstructive sleep apnea)    unable to sleep with use of CPAP  . Peyronie disease   . Pollen allergies   . Reactive airways dysfunction syndrome ([place])   . Venous stasis     Surgical History: Past Surgical History:  Procedure Laterality Date  . COLONOSCOPY N/A 01/21/2016   Procedure: COLONOSCOPY;  Surgeon: Daneil Dolin, MD;  [facility];  Service: Endoscopy;  Laterality: N/A;  1:00 PM - moved to 12:30 - office notified per Tretha Sciara  . EXTRACORPOREAL SHOCK WAVE LITHOTRIPSY Left 01/17/2020   Procedure: EXTRACORPOREAL SHOCK WAVE LITHOTRIPSY (ESWL);  Surgeon: Cleon Gustin, MD;  Location[facility];  Service: Urology;  Laterality: Left;  . KNEE ARTHROSCOPY Bilateral   . POLYPECTOMY  01/21/2016   Procedure: POLYPECTOMY;  Surgeon: Daneil Dolin, MD;  [facility];  Service: Endoscopy;;  colon  . SHOULDER ARTHROSCOPY Left   . SHOULDER SURGERY Right 2016  . TOTAL KNEE ARTHROPLASTY Right 01/25/2019   Procedure: TOTAL KNEE ARTHROPLASTY;  Surgeon: Paralee Cancel, MD;  [facility];  Service: Orthopedics;  Laterality: Right;  70 mins  . TOTAL KNEE ARTHROPLASTY Left 03/22/2019   Procedure: TOTAL KNEE ARTHROPLASTY;  Surgeon: Paralee Cancel, MD;  [facility];  Service: Orthopedics;  Laterality: Left;  70 mins    Home  Medications:  Allergies as of 01/31/2020      Reactions   Other    Cats   Red Dye Hives   Pt's wife reported he gets hives with "food" red dye, but can take tablets that are red or pink.       Medication List       Accurate as of January 31, 2020  1:23 PM. If you have any questions, ask your nurse or doctor.        aspirin EC 81 MG tablet Take 81 mg by mouth daily.   enalapril 10 MG tablet Commonly known as: VASOTEC Take 1 tablet (10 mg total) by mouth daily.   tamsulosin 0.4 MG Caps capsule Commonly known as: FLOMAX TAKE 1 CAPSULE(0.4 MG) BY MOUTH DAILY   Turmeric 500 MG Caps Take 1,000 mg by mouth daily.       Allergies:  Allergies  Allergen Reactions  . Other     Cats  . Red Dye Hives    Pt's wife reported he gets hives with "food" red dye, but can take tablets that are red or pink.     Family History: Family History  Problem Relation Age of Onset  . Heart disease Father   . Diabetes Brother   . Hyperlipidemia Brother     Social History:  reports that he has never smoked. He has never used smokeless tobacco. He reports that  he does not drink alcohol and does not use drugs.  ROS: All other review of systems were reviewed and are negative except what is noted above in HPI  Physical Exam: BP 131/75   Pulse 80   Temp 98.6 F (37 C)   Ht 6' (1.829 m)   Wt 266 lb (120.7 kg)   BMI 36.08 kg/m   Constitutional:  Alert and oriented, No acute distress. HEENT: [place] AT, moist mucus membranes.  Trachea midline, no masses. Cardiovascular: No clubbing, cyanosis, or edema. Respiratory: Normal respiratory effort, no increased work of breathing. GI: Abdomen is soft, nontender, nondistended, no abdominal masses GU: No CVA tenderness.  Lymph: No cervical or inguinal lymphadenopathy. Skin: No rashes, bruises or suspicious lesions. Neurologic: Grossly intact, no focal deficits, moving all 4 extremities. Psychiatric: Normal mood and affect.  Laboratory Data: Lab  Results  Component Value Date   WBC 5.7 09/05/2019   HGB 14.3 09/05/2019   HCT 42.8 09/05/2019   MCV 96 09/05/2019   PLT 264 09/05/2019    Lab Results  Component Value Date   CREATININE 1.20 09/05/2019    Lab Results  Component Value Date   PSA 0.2 07/17/2014   PSA 0.31 07/11/2013    No results found for: TESTOSTERONE  No results found for: HGBA1C  Urinalysis    Component Value Date/Time   APPEARANCEUR Clear 01/09/2020 1549   GLUCOSEU Negative 01/09/2020 1549   BILIRUBINUR Negative 01/09/2020 1549   PROTEINUR 2+ (A) 01/09/2020 1549   UROBILINOGEN negative (A) 06/14/2019 1549   NITRITE Negative 01/09/2020 1549   LEUKOCYTESUR Negative 01/09/2020 1549    Lab Results  Component Value Date   LABMICR See below: 01/09/2020   WBCUA None seen 01/09/2020   LABEPIT 0-10 01/09/2020   MUCUS Present 01/09/2020   BACTERIA Many (A) 01/09/2020    Pertinent Imaging: KUB yesterday: Images reviewed and discussed with the patient Results for orders placed during the hospital encounter of 01/30/20  DG Abd 1 View  Narrative CLINICAL DATA:  Post lithotripsy  EXAM: ABDOMEN - 1 VIEW  COMPARISON:  01/17/2020, CT 01/09/2020  FINDINGS: Small right greater than left intrarenal stones, measuring up to 4 mm on the right and 4 mm on the left. Phleboliths in the left pelvis. The previously noted distal ureteral stone on the left on comparison CT is not seen on today's radiograph.  IMPRESSION: 1. The previously noted distal left ureteral stone on comparison CT is not seen on today's radiograph. 2. Bilateral intrarenal stones.   Electronically Signed By: Donavan Foil M.D. On: 01/30/2020 21:26  No results found for this or any previous visit.  No results found for this or any previous visit.  No results found for this or any previous visit.  No results found for this or any previous visit.  No results found for this or any previous visit.  No results found for this or  any previous visit.  Results for orders placed during the hospital encounter of 01/09/20  CT RENAL STONE STUDY  Narrative CLINICAL DATA:  Left-sided flank pain for several days  EXAM: CT ABDOMEN AND PELVIS WITHOUT CONTRAST  TECHNIQUE: Multidetector CT imaging of the abdomen and pelvis was performed following the standard protocol without IV contrast.  COMPARISON:  12/09/2007  FINDINGS: Lower chest: No acute abnormality.  Hepatobiliary: No focal liver abnormality is seen. No gallstones, gallbladder wall thickening, or biliary dilatation.  Pancreas: Unremarkable. No pancreatic ductal dilatation or surrounding inflammatory changes.  Spleen: Normal in size without  focal abnormality.  Adrenals/Urinary Tract: Adrenal glands are within normal limits. Kidneys are well visualized bilaterally with bilateral renal calculi increased in size and number when compared with the prior exam. The largest of these on the right measures 4 mm in the upper pole. Largest of these on the left measures 6 mm in the midportion of the left kidney. Mild fullness of the collecting system and left ureter is seen. Some Peri ureteral stranding is noted. Two distal left ureteral stones are noted. The dominant stone measures approximately 6 mm. The smaller adjacent stone measures 1-2 mm. This is best visualized on image number 81 of series 6. The bladder is decompressed.  Stomach/Bowel: The appendix is within normal limits. Colon shows no obstructive or inflammatory changes. Small bowel and stomach appear within normal limits as well.  Vascular/Lymphatic: Aortic atherosclerosis. No enlarged abdominal or pelvic lymph nodes.  Reproductive: Prostate is unremarkable.  Other: No abdominal wall hernia or abnormality. No abdominopelvic ascites.  Musculoskeletal: No acute or significant osseous findings.  IMPRESSION: Two distal left ureteral stones as described above causing mild hydronephrosis and  hydroureter.  Bilateral nonobstructing renal stones as described.   Electronically Signed By: Inez Catalina M.D. On: 01/09/2020 09:44   Assessment & Plan:    1. Right Nephrolithiasis -We discussed the management of kidney stones. These options include observation, ureteroscopy, shockwave lithotripsy (ESWL) and percutaneous nephrolithotomy (PCNL). We discussed which options are relevant to the patient's stone(s). We discussed the natural history of kidney stones as well as the complications of untreated stones and the impact on quality of life without treatment as well as with each of the above listed treatments. We also discussed the efficacy of each treatment in its ability to clear the stone burden. With any of these management options I discussed the signs and symptoms of infection and the need for emergent treatment should these be experienced. For each option we discussed the ability of each procedure to clear the patient of their stone burden.   For observation I described the risks which include but are not limited to silent renal damage, life-threatening infection, need for emergent surgery, failure to pass stone and pain.   For ureteroscopy I described the risks which include bleeding, infection, damage to contiguous structures, positioning injury, ureteral stricture, ureteral avulsion, ureteral injury, need for prolonged ureteral stent, inability to perform ureteroscopy, need for an interval procedure, inability to clear stone burden, stent discomfort/pain, heart attack, stroke, pulmonary embolus and the inherent risks with general anesthesia.   For shockwave lithotripsy I described the risks which include arrhythmia, kidney contusion, kidney hemorrhage, need for transfusion, pain, inability to adequately break up stone, inability to pass stone fragments, Steinstrasse, infection associated with obstructing stones, need for alternate surgical procedure, need for repeat shockwave  lithotripsy, MI, CVA, PE and the inherent risks with anesthesia/conscious sedation.   For PCNL I described the risks including positioning injury, pneumothorax, hydrothorax, need for chest tube, inability to clear stone burden, renal laceration, arterial venous fistula or malformation, need for embolization of kidney, loss of kidney or renal function, need for repeat procedure, need for prolonged nephrostomy tube, ureteral avulsion, MI, CVA, PE and the inherent risks of general anesthesia.   - The patient would like to proceed with Right ESWL - Urinalysis, Routine w reflex microscopic   No follow-ups on file.  Nicolette Bang, MD  [facility] [place]

## 2020-01-31 NOTE — Patient Instructions (Signed)

## 2020-02-07 ENCOUNTER — Encounter (HOSPITAL_COMMUNITY)
Admission: RE | Admit: 2020-02-07 | Discharge: 2020-02-07 | Disposition: A | Discharge status: Home / Self Care | Payer: Medicare Other | Source: Ambulatory Visit | Attending: Urology | Admitting: Urology

## 2020-02-07 ENCOUNTER — Other Ambulatory Visit: Payer: Self-pay

## 2020-02-07 ENCOUNTER — Encounter (HOSPITAL_COMMUNITY): Payer: Self-pay

## 2020-02-10 ENCOUNTER — Other Ambulatory Visit: Payer: Self-pay

## 2020-02-10 ENCOUNTER — Other Ambulatory Visit (HOSPITAL_COMMUNITY)
Admission: RE | Admit: 2020-02-10 | Discharge: 2020-02-10 | Disposition: A | Discharge status: Home / Self Care | Payer: Medicare Other | Source: Ambulatory Visit | Attending: Urology | Admitting: Urology

## 2020-02-10 DIAGNOSIS — Z20822 Contact with and (suspected) exposure to covid-19: Secondary | ICD-10-CM | POA: Insufficient documentation

## 2020-02-10 DIAGNOSIS — Z01812 Encounter for preprocedural laboratory examination: Secondary | ICD-10-CM | POA: Diagnosis present

## 2020-02-11 LAB — SARS CORONAVIRUS 2 (TAT 6-24 HRS): SARS Coronavirus 2: NEGATIVE

## 2020-02-14 ENCOUNTER — Other Ambulatory Visit: Payer: Self-pay | Admitting: Urology

## 2020-02-14 ENCOUNTER — Encounter (HOSPITAL_COMMUNITY)
Admission: RE | Disposition: A | Discharge status: Home / Self Care | Payer: Self-pay | Source: Home / Self Care | Attending: Urology

## 2020-02-14 ENCOUNTER — Ambulatory Visit (HOSPITAL_COMMUNITY)
Admission: RE | Admit: 2020-02-14 | Discharge: 2020-02-14 | Disposition: A | Discharge status: Home / Self Care | Payer: Medicare Other | Source: Home / Self Care | Attending: Urology | Admitting: Urology

## 2020-02-14 ENCOUNTER — Encounter (HOSPITAL_COMMUNITY): Payer: Self-pay | Admitting: Urology

## 2020-02-14 ENCOUNTER — Ambulatory Visit (HOSPITAL_COMMUNITY)
Admission: RE | Admit: 2020-02-14 | Discharge: 2020-02-14 | Disposition: A | Discharge status: Home / Self Care | Payer: Medicare Other | Attending: Urology | Admitting: Urology

## 2020-02-14 DIAGNOSIS — Z833 Family history of diabetes mellitus: Secondary | ICD-10-CM | POA: Diagnosis not present

## 2020-02-14 DIAGNOSIS — Z96652 Presence of left artificial knee joint: Secondary | ICD-10-CM | POA: Diagnosis not present

## 2020-02-14 DIAGNOSIS — Z8249 Family history of ischemic heart disease and other diseases of the circulatory system: Secondary | ICD-10-CM | POA: Diagnosis not present

## 2020-02-14 DIAGNOSIS — N132 Hydronephrosis with renal and ureteral calculous obstruction: Secondary | ICD-10-CM | POA: Insufficient documentation

## 2020-02-14 DIAGNOSIS — K760 Fatty (change of) liver, not elsewhere classified: Secondary | ICD-10-CM | POA: Insufficient documentation

## 2020-02-14 DIAGNOSIS — I10 Essential (primary) hypertension: Secondary | ICD-10-CM | POA: Insufficient documentation

## 2020-02-14 DIAGNOSIS — Z9102 Food additives allergy status: Secondary | ICD-10-CM | POA: Diagnosis not present

## 2020-02-14 DIAGNOSIS — N200 Calculus of kidney: Secondary | ICD-10-CM | POA: Diagnosis present

## 2020-02-14 DIAGNOSIS — Z9109 Other allergy status, other than to drugs and biological substances: Secondary | ICD-10-CM | POA: Insufficient documentation

## 2020-02-14 DIAGNOSIS — Z87442 Personal history of urinary calculi: Secondary | ICD-10-CM | POA: Insufficient documentation

## 2020-02-14 HISTORY — PX: EXTRACORPOREAL SHOCK WAVE LITHOTRIPSY: SHX1557

## 2020-02-14 SURGERY — LITHOTRIPSY, ESWL
Anesthesia: LOCAL | Laterality: Right

## 2020-02-14 MED ORDER — DIAZEPAM 5 MG PO TABS
10.0000 mg | ORAL_TABLET | Freq: Once | ORAL | Status: AC
  Administered 2020-02-14: 10 mg via ORAL
  Filled 2020-02-14: qty 2

## 2020-02-14 MED ORDER — OXYCODONE-ACETAMINOPHEN 5-325 MG PO TABS
1.0000 | ORAL_TABLET | ORAL | 0 refills | Status: DC | PRN
Start: 2020-02-14 — End: 2020-08-15

## 2020-02-14 MED ORDER — TAMSULOSIN HCL 0.4 MG PO CAPS
0.4000 mg | ORAL_CAPSULE | Freq: Every day | ORAL | 1 refills | Status: DC
Start: 2020-02-14 — End: 2020-02-14

## 2020-02-14 MED ORDER — DIPHENHYDRAMINE HCL 25 MG PO CAPS
25.0000 mg | ORAL_CAPSULE | Freq: Once | ORAL | Status: AC
  Administered 2020-02-14: 25 mg via ORAL
  Filled 2020-02-14: qty 1

## 2020-02-14 MED ORDER — ONDANSETRON HCL 4 MG PO TABS: 4.0000 mg | ORAL_TABLET | Freq: Every day | ORAL | 1 refills | Status: DC | PRN

## 2020-02-14 MED ORDER — LACTATED RINGERS IV SOLN: INTRAVENOUS | Status: DC

## 2020-02-14 NOTE — Discharge Instructions (Signed)
Lithotripsy, Care After This sheet gives you information about how to care for yourself after your procedure. Your health care provider may also give you more specific instructions. If you have problems or questions, contact your health care provider. What can I expect after the procedure? After the procedure, it is common to have:  Some blood in your urine. This should only last for a few days.  Soreness in your back, sides, or upper abdomen for a few days.  Blotches or bruises on your back where the pressure wave entered the skin.  Pain, discomfort, or nausea when pieces (fragments) of the kidney stone move through the tube that carries urine from the kidney to the bladder (ureter). Stone fragments may pass soon after the procedure, but they may continue to pass for up to 4-8 weeks. ? If you have severe pain or nausea, contact your health care provider. This may be caused by a large stone that was not broken up, and this may mean that you need more treatment.  Some pain or discomfort during urination.  Some pain or discomfort in the lower abdomen or (in men) at the base of the penis. Follow these instructions at home: Medicines  Take over-the-counter and prescription medicines only as told by your health care provider.  If you were prescribed an antibiotic medicine, take it as told by your health care provider. Do not stop taking the antibiotic even if you start to feel better.  Do not drive for 24 hours if you were given a medicine to help you relax (sedative).  Do not drive or use heavy machinery while taking prescription pain medicine. Eating and drinking      Drink enough water and fluids to keep your urine clear or pale yellow. This helps any remaining pieces of the stone to pass. It can also help prevent new stones from forming.  Eat plenty of fresh fruits and vegetables.  Follow instructions from your health care provider about eating and drinking restrictions. You may be  instructed: ? To reduce how much salt (sodium) you eat or drink. Check ingredients and nutrition facts on packaged foods and beverages. ? To reduce how much meat you eat.  Eat the recommended amount of calcium for your age and gender. Ask your health care provider how much calcium you should have. General instructions  Get plenty of rest.  Most people can resume normal activities 1-2 days after the procedure. Ask your health care provider what activities are safe for you.  Your health care provider may direct you to lie in a certain position (postural drainage) and tap firmly (percuss) over your kidney area to help stone fragments pass. Follow instructions as told by your health care provider.  If directed, strain all urine through the strainer that was provided by your health care provider. ? Keep all fragments for your health care provider to see. Any stones that are found may be sent to a medical lab for examination. The stone may be as small as a grain of salt.  Keep all follow-up visits as told by your health care provider. This is important. Contact a health care provider if:  You have pain that is severe or does not get better with medicine.  You have nausea that is severe or does not go away.  You have blood in your urine longer than your health care provider told you to expect.  You have more blood in your urine.  You have pain during urination that does   not go away.  You urinate more frequently than usual and this does not go away.  You develop a rash or any other possible signs of an allergic reaction. Get help right away if:  You have severe pain in your back, sides, or upper abdomen.  You have severe pain while urinating.  Your urine is very dark red.  You have blood in your stool (feces).  You cannot pass any urine at all.  You feel a strong urge to urinate after emptying your bladder.  You have a fever or chills.  You develop shortness of breath,  difficulty breathing, or chest pain.  You have severe nausea that leads to persistent vomiting.  You faint. Summary  After this procedure, it is common to have some pain, discomfort, or nausea when pieces (fragments) of the kidney stone move through the tube that carries urine from the kidney to the bladder (ureter). If this pain or nausea is severe, however, you should contact your health care provider.  Most people can resume normal activities 1-2 days after the procedure. Ask your health care provider what activities are safe for you.  Drink enough water and fluids to keep your urine clear or pale yellow. This helps any remaining pieces of the stone to pass, and it can help prevent new stones from forming.  If directed, strain your urine and keep all fragments for your health care provider to see. Fragments or stones may be as small as a grain of salt.  Get help right away if you have severe pain in your back, sides, or upper abdomen or have severe pain while urinating. This information is not intended to replace advice given to you by your health care provider. Make sure you discuss any questions you have with your health care provider. Document Revised: 07/12/2018 Document Reviewed: 02/20/2016 Elsevier Patient Education  2020 [employer]  

## 2020-02-14 NOTE — Interval H&P Note (Signed)
History and Physical Interval Note:  02/14/2020 8:07 AM  Fernando Riley  has presented today for surgery, with the diagnosis of right renal calculus.  The various methods of treatment have been discussed with the patient and family. After consideration of risks, benefits and other options for treatment, the patient has consented to  Procedure(s): EXTRACORPOREAL SHOCK WAVE LITHOTRIPSY (ESWL) (Right) as a surgical intervention.  The patient's history has been reviewed, patient examined, no change in status, stable for surgery.  I have reviewed the patient's chart and labs.  Questions were answered to the patient's satisfaction.     Nicolette Bang

## 2020-02-14 NOTE — Progress Notes (Signed)
Lithotripsy to right flank

## 2020-02-15 ENCOUNTER — Encounter (HOSPITAL_COMMUNITY): Payer: Self-pay | Admitting: Urology

## 2020-02-16 ENCOUNTER — Other Ambulatory Visit: Payer: Self-pay

## 2020-02-16 DIAGNOSIS — Z9889 Other specified postprocedural states: Secondary | ICD-10-CM

## 2020-02-28 ENCOUNTER — Ambulatory Visit (HOSPITAL_COMMUNITY)
Admission: RE | Admit: 2020-02-28 | Discharge: 2020-02-28 | Disposition: A | Discharge status: Home / Self Care | Payer: Medicare Other | Source: Ambulatory Visit | Attending: Urology | Admitting: Urology

## 2020-02-28 ENCOUNTER — Encounter: Payer: Self-pay | Admitting: Urology

## 2020-02-28 ENCOUNTER — Other Ambulatory Visit: Payer: Self-pay

## 2020-02-28 ENCOUNTER — Ambulatory Visit (INDEPENDENT_AMBULATORY_CARE_PROVIDER_SITE_OTHER): Payer: Medicare Other | Admitting: Urology

## 2020-02-28 VITALS — BP 154/82 | HR 86 | Temp 98.4°F

## 2020-02-28 DIAGNOSIS — Z9889 Other specified postprocedural states: Secondary | ICD-10-CM | POA: Diagnosis not present

## 2020-02-28 LAB — URINALYSIS, ROUTINE W REFLEX MICROSCOPIC
Bilirubin, UA: NEGATIVE
Glucose, UA: NEGATIVE
Ketones, UA: NEGATIVE
Leukocytes,UA: NEGATIVE
Nitrite, UA: NEGATIVE
Protein,UA: NEGATIVE
RBC, UA: NEGATIVE
Specific Gravity, UA: >1.03 — ABNORMAL HIGH (ref 1.005–1.030)
Urobilinogen, Ur: 0.2 mg/dL (ref 0.2–1.0)
pH, UA: 5 (ref 5.0–7.5)

## 2020-02-28 NOTE — Patient Instructions (Signed)

## 2020-02-28 NOTE — Progress Notes (Signed)
02/28/2020 1:58 PM   Fernando Riley 1950/11/03 [id number]  Referring provider: Erven Colla, DO [place],  [place] [address]  nephrolithiasis  HPI: Fernando Riley is a 69yo here for followup for nephrolithiasis. He had ESWl 2 weeks ago. He passed 4 fragments. He denies any pain. No LUTS.    PMH: Past Medical History:  Diagnosis Date  . Arthritis   . Cat allergies   . Fatty liver   . History of kidney stones   . History of migraine   . Hypertension   . Impaired fasting glucose   . Kidney stone   . Melanoma ([place])    lower legs bilaterally  . OSA (obstructive sleep apnea)    unable to sleep with use of CPAP  . Peyronie disease   . Pollen allergies   . Reactive airways dysfunction syndrome ([place])   . Venous stasis     Surgical History: Past Surgical History:  Procedure Laterality Date  . COLONOSCOPY N/A 01/21/2016   Procedure: COLONOSCOPY;  Surgeon: Daneil Dolin, MD;  [facility];  Service: Endoscopy;  Laterality: N/A;  1:00 PM - moved to 12:30 - office notified per Tretha Sciara  . EXTRACORPOREAL SHOCK WAVE LITHOTRIPSY Left 01/17/2020   Procedure: EXTRACORPOREAL SHOCK WAVE LITHOTRIPSY (ESWL);  Surgeon: Cleon Gustin, MD;  Location: [facility];  Service: Urology;  Laterality: Left;  . EXTRACORPOREAL SHOCK WAVE LITHOTRIPSY Right 02/14/2020   Procedure: EXTRACORPOREAL SHOCK WAVE LITHOTRIPSY (ESWL);  Surgeon: Cleon Gustin, MD;  Location: [facility];  Service: Urology;  Laterality: Right;  . KNEE ARTHROSCOPY Bilateral   . POLYPECTOMY  01/21/2016   Procedure: POLYPECTOMY;  Surgeon: Daneil Dolin, MD;  [facility];  Service: Endoscopy;;  colon  . SHOULDER ARTHROSCOPY Left   . SHOULDER SURGERY Right 2016  . TOTAL KNEE ARTHROPLASTY Right 01/25/2019   Procedure: TOTAL KNEE ARTHROPLASTY;  Surgeon: Paralee Cancel, MD;  [facility];  Service: Orthopedics;  Laterality: Right;  70 mins  . TOTAL KNEE ARTHROPLASTY Left 03/22/2019   Procedure: TOTAL KNEE  ARTHROPLASTY;  Surgeon: Paralee Cancel, MD;  [facility];  Service: Orthopedics;  Laterality: Left;  70 mins    Home Medications:  Allergies as of 02/28/2020      Reactions   Other    Cats   Red Dye Hives   Pt's wife reported he gets hives with "food" red dye, but can take tablets that are red or pink.       Medication List       Accurate as of February 28, 2020  1:58 PM. If you have any questions, ask your nurse or doctor.        acetaminophen 500 MG tablet Commonly known as: TYLENOL Take 500 mg by mouth daily.   aspirin EC 81 MG tablet Take 81 mg by mouth daily.   CeleBREX 200 MG capsule Generic drug: celecoxib Take 200 mg by mouth daily.   enalapril 10 MG tablet Commonly known as: VASOTEC Take 1 tablet (10 mg total) by mouth daily.   ondansetron 4 MG tablet Commonly known as: Zofran Take 1 tablet (4 mg total) by mouth daily as needed for nausea or vomiting.   oxyCODONE-acetaminophen 5-325 MG tablet Commonly known as: Percocet Take 1 tablet by mouth every 4 (four) hours as needed for moderate pain or severe pain.   tamsulosin 0.4 MG Caps capsule Commonly known as: FLOMAX TAKE 1 CAPSULE(0.4 MG) BY MOUTH DAILY   Turmeric 500  MG Caps Take 1,000 mg by mouth daily.       Allergies:  Allergies  Allergen Reactions  . Other     Cats  . Red Dye Hives    Pt's wife reported he gets hives with "food" red dye, but can take tablets that are red or pink.     Family History: Family History  Problem Relation Age of Onset  . Heart disease Father   . Diabetes Brother   . Hyperlipidemia Brother     Social History:  reports that he has never smoked. He has never used smokeless tobacco. He reports that he does not drink alcohol and does not use drugs.  ROS: All other review of systems were reviewed and are negative except what is noted above in HPI  Physical Exam: BP (!) 154/82   Pulse 86   Temp 98.4 F (36.9 C)   Constitutional:  Alert and oriented, No  acute distress. HEENT: [place] AT, moist mucus membranes.  Trachea midline, no masses. Cardiovascular: No clubbing, cyanosis, or edema. Respiratory: Normal respiratory effort, no increased work of breathing. GI: Abdomen is soft, nontender, nondistended, no abdominal masses GU: No CVA tenderness.  Lymph: No cervical or inguinal lymphadenopathy. Skin: No rashes, bruises or suspicious lesions. Neurologic: Grossly intact, no focal deficits, moving all 4 extremities. Psychiatric: Normal mood and affect.  Laboratory Data: Lab Results  Component Value Date   WBC 5.7 09/05/2019   HGB 14.3 09/05/2019   HCT 42.8 09/05/2019   MCV 96 09/05/2019   PLT 264 09/05/2019    Lab Results  Component Value Date   CREATININE 1.20 09/05/2019    Lab Results  Component Value Date   PSA 0.2 07/17/2014   PSA 0.31 07/11/2013    No results found for: TESTOSTERONE  No results found for: HGBA1C  Urinalysis    Component Value Date/Time   APPEARANCEUR Clear 01/31/2020 1337   GLUCOSEU Negative 01/31/2020 1337   BILIRUBINUR Negative 01/31/2020 1337   PROTEINUR Trace (A) 01/31/2020 1337   UROBILINOGEN negative (A) 06/14/2019 1549   NITRITE Negative 01/31/2020 1337   LEUKOCYTESUR Negative 01/31/2020 1337    Lab Results  Component Value Date   LABMICR Comment 01/31/2020   WBCUA None seen 01/09/2020   LABEPIT 0-10 01/09/2020   MUCUS Present 01/09/2020   BACTERIA Many (A) 01/09/2020    Pertinent Imaging:  Results for orders placed during the hospital encounter of 02/14/20  DG Abd 1 View  Narrative CLINICAL DATA:  Renal calculus, post lithotripsy  EXAM: ABDOMEN - 1 VIEW  COMPARISON:  01/17/20, 01/30/2020  FINDINGS: Multiple calculi overlying the upper and lower pole of the right kidney are unchanged. Punctate calculi overlying the left kidney noted on remote prior examination is no longer visualized. No definite ureteral calculi. Multiple phleboliths noted within the left hemipelvis.  Normal abdominal gas pattern.  IMPRESSION: Stable right nephrolithiasis.  No definite urolithiasis.   Electronically Signed By: Fidela Salisbury MD On: 02/14/2020 23:26  No results found for this or any previous visit.  No results found for this or any previous visit.  No results found for this or any previous visit.  No results found for this or any previous visit.  No results found for this or any previous visit.  No results found for this or any previous visit.  Results for orders placed during the hospital encounter of 01/09/20  CT RENAL STONE STUDY  Narrative CLINICAL DATA:  Left-sided flank pain for several days  EXAM: CT ABDOMEN  AND PELVIS WITHOUT CONTRAST  TECHNIQUE: Multidetector CT imaging of the abdomen and pelvis was performed following the standard protocol without IV contrast.  COMPARISON:  12/09/2007  FINDINGS: Lower chest: No acute abnormality.  Hepatobiliary: No focal liver abnormality is seen. No gallstones, gallbladder wall thickening, or biliary dilatation.  Pancreas: Unremarkable. No pancreatic ductal dilatation or surrounding inflammatory changes.  Spleen: Normal in size without focal abnormality.  Adrenals/Urinary Tract: Adrenal glands are within normal limits. Kidneys are well visualized bilaterally with bilateral renal calculi increased in size and number when compared with the prior exam. The largest of these on the right measures 4 mm in the upper pole. Largest of these on the left measures 6 mm in the midportion of the left kidney. Mild fullness of the collecting system and left ureter is seen. Some Peri ureteral stranding is noted. Two distal left ureteral stones are noted. The dominant stone measures approximately 6 mm. The smaller adjacent stone measures 1-2 mm. This is best visualized on image number 81 of series 6. The bladder is decompressed.  Stomach/Bowel: The appendix is within normal limits. Colon shows no obstructive  or inflammatory changes. Small bowel and stomach appear within normal limits as well.  Vascular/Lymphatic: Aortic atherosclerosis. No enlarged abdominal or pelvic lymph nodes.  Reproductive: Prostate is unremarkable.  Other: No abdominal wall hernia or abnormality. No abdominopelvic ascites.  Musculoskeletal: No acute or significant osseous findings.  IMPRESSION: Two distal left ureteral stones as described above causing mild hydronephrosis and hydroureter.  Bilateral nonobstructing renal stones as described.   Electronically Signed By: Inez Catalina M.D. On: 01/09/2020 09:44   Assessment & Plan:    1. Status post laser lithotripsy of ureteral calculus -KUB today, will call with results. If it shows no fragments I will see him in 1 month with KUB - Urinalysis, Routine w reflex microscopic   No follow-ups on file.  Nicolette Bang, MD  [facility] [place]

## 2020-02-28 NOTE — Progress Notes (Signed)

## 2020-03-12 ENCOUNTER — Other Ambulatory Visit: Payer: Self-pay | Admitting: Family Medicine

## 2020-03-28 ENCOUNTER — Other Ambulatory Visit: Payer: Self-pay

## 2020-03-28 ENCOUNTER — Ambulatory Visit (INDEPENDENT_AMBULATORY_CARE_PROVIDER_SITE_OTHER): Payer: Medicare Other | Admitting: Urology

## 2020-03-28 VITALS — BP 132/75 | HR 66 | Temp 98.9°F | Ht 72.0 in | Wt 266.1 lb

## 2020-03-28 DIAGNOSIS — N200 Calculus of kidney: Secondary | ICD-10-CM

## 2020-03-28 DIAGNOSIS — Z9889 Other specified postprocedural states: Secondary | ICD-10-CM

## 2020-03-28 LAB — URINALYSIS, ROUTINE W REFLEX MICROSCOPIC
Bilirubin, UA: NEGATIVE
Glucose, UA: NEGATIVE
Ketones, UA: NEGATIVE
Leukocytes,UA: NEGATIVE
Nitrite, UA: NEGATIVE
RBC, UA: NEGATIVE
Specific Gravity, UA: 1.025 (ref 1.005–1.030)
Urobilinogen, Ur: 0.2 mg/dL (ref 0.2–1.0)
pH, UA: 6 (ref 5.0–7.5)

## 2020-03-28 NOTE — Progress Notes (Signed)
03/28/2020 9:08 AM   Fernando Riley 12/31/1950 [id number]  Referring provider: Erven Colla, DO [address] [place],  [place] [address]  followup nephrolithiasis  HPI: Mr Fernando Riley is a 69yo here for followup for nephrolithiasis. No stone passage since last visit. He has a known right 62mm mid pole calculus. No significant LUTS. No fevers   PMH: Past Medical History:  Diagnosis Date  . Arthritis   . Cat allergies   . Fatty liver   . History of kidney stones   . History of migraine   . Hypertension   . Impaired fasting glucose   . Kidney stone   . Melanoma ([place])    lower legs bilaterally  . OSA (obstructive sleep apnea)    unable to sleep with use of CPAP  . Peyronie disease   . Pollen allergies   . Reactive airways dysfunction syndrome ([place])   . Venous stasis     Surgical History: Past Surgical History:  Procedure Laterality Date  . COLONOSCOPY N/A 01/21/2016   Procedure: COLONOSCOPY;  Surgeon: Daneil Dolin, MD;  [facility];  Service: Endoscopy;  Laterality: N/A;  1:00 PM - moved to 12:30 - office notified per Tretha Sciara  . EXTRACORPOREAL SHOCK WAVE LITHOTRIPSY Left 01/17/2020   Procedure: EXTRACORPOREAL SHOCK WAVE LITHOTRIPSY (ESWL);  Surgeon: Cleon Gustin, MD;  [facility];  Service: Urology;  Laterality: Left;  . EXTRACORPOREAL SHOCK WAVE LITHOTRIPSY Right 02/14/2020   Procedure: EXTRACORPOREAL SHOCK WAVE LITHOTRIPSY (ESWL);  Surgeon: Cleon Gustin, MD;  [facility];  Service: Urology;  Laterality: Right;  . KNEE ARTHROSCOPY Bilateral   . POLYPECTOMY  01/21/2016   Procedure: POLYPECTOMY;  Surgeon: Daneil Dolin, MD;  [facility];  Service: Endoscopy;;  colon  . SHOULDER ARTHROSCOPY Left   . SHOULDER SURGERY Right 2016  . TOTAL KNEE ARTHROPLASTY Right 01/25/2019   Procedure: TOTAL KNEE ARTHROPLASTY;  Surgeon: Paralee Cancel, MD;  [facility];  Service: Orthopedics;  Laterality: Right;  70 mins  . TOTAL KNEE  ARTHROPLASTY Left 03/22/2019   Procedure: TOTAL KNEE ARTHROPLASTY;  Surgeon: Paralee Cancel, MD;  [facility];  Service: Orthopedics;  Laterality: Left;  70 mins    Home Medications:  Allergies as of 03/28/2020      Reactions   Other    Cats   Red Dye Hives   Pt's wife reported he gets hives with "food" red dye, but can take tablets that are red or pink.       Medication List       Accurate as of March 28, 2020  9:08 AM. If you have any questions, ask your nurse or doctor.        acetaminophen 500 MG tablet Commonly known as: TYLENOL Take 500 mg by mouth daily.   aspirin EC 81 MG tablet Take 81 mg by mouth daily.   celecoxib 200 MG capsule Commonly known as: CELEBREX Take 200 mg by mouth daily.   enalapril 10 MG tablet Commonly known as: VASOTEC TAKE 1 TABLET EVERY DAY   ondansetron 4 MG tablet Commonly known as: Zofran Take 1 tablet (4 mg total) by mouth daily as needed for nausea or vomiting.   oxyCODONE-acetaminophen 5-325 MG tablet Commonly known as: Percocet Take 1 tablet by mouth every 4 (four) hours as needed for moderate pain or severe pain.   tamsulosin 0.4 MG Caps capsule Commonly known as: FLOMAX TAKE 1 CAPSULE(0.4 MG) BY MOUTH DAILY   Turmeric  500 MG Caps Take 1,000 mg by mouth daily.       Allergies:  Allergies  Allergen Reactions  . Other     Cats  . Red Dye Hives    Pt's wife reported he gets hives with "food" red dye, but can take tablets that are red or pink.     Family History: Family History  Problem Relation Age of Onset  . Heart disease Father   . Diabetes Brother   . Hyperlipidemia Brother     Social History:  reports that he has never smoked. He has never used smokeless tobacco. He reports that he does not drink alcohol and does not use drugs.  ROS: All other review of systems were reviewed and are negative except what is noted above in HPI  Physical Exam: BP 132/75   Pulse 66   Temp 98.9 F (37.2 C)   Ht 6'  (1.829 m)   Wt 266 lb 1.6 oz (120.7 kg)   BMI 36.09 kg/m   Constitutional:  Alert and oriented, No acute distress. HEENT: [place] AT, moist mucus membranes.  Trachea midline, no masses. Cardiovascular: No clubbing, cyanosis, or edema. Respiratory: Normal respiratory effort, no increased work of breathing. GI: Abdomen is soft, nontender, nondistended, no abdominal masses GU: No CVA tenderness.  Lymph: No cervical or inguinal lymphadenopathy. Skin: No rashes, bruises or suspicious lesions. Neurologic: Grossly intact, no focal deficits, moving all 4 extremities. Psychiatric: Normal mood and affect.  Laboratory Data: Lab Results  Component Value Date   WBC 5.7 09/05/2019   HGB 14.3 09/05/2019   HCT 42.8 09/05/2019   MCV 96 09/05/2019   PLT 264 09/05/2019    Lab Results  Component Value Date   CREATININE 1.20 09/05/2019    Lab Results  Component Value Date   PSA 0.2 07/17/2014   PSA 0.31 07/11/2013    No results found for: TESTOSTERONE  No results found for: HGBA1C  Urinalysis    Component Value Date/Time   APPEARANCEUR Clear 02/28/2020 1401   GLUCOSEU Negative 02/28/2020 1401   BILIRUBINUR Negative 02/28/2020 1401   PROTEINUR Negative 02/28/2020 1401   UROBILINOGEN negative (A) 06/14/2019 1549   NITRITE Negative 02/28/2020 1401   LEUKOCYTESUR Negative 02/28/2020 1401    Lab Results  Component Value Date   LABMICR Comment 02/28/2020   WBCUA None seen 01/09/2020   LABEPIT 0-10 01/09/2020   MUCUS Present 01/09/2020   BACTERIA Many (A) 01/09/2020    Pertinent Imaging: KUB 02/28/2020: Images reviewed and discussed with the patient Results for orders placed during the hospital encounter of 02/28/20  Abdomen 1 view (KUB)  Narrative CLINICAL DATA:  History of recent right lithotripsy.  EXAM: ABDOMEN - 1 VIEW  COMPARISON:  February 14, 2020.  FINDINGS: Overlying bowel gas obscures portion of the right kidney, but otherwise similar appearance of multiple  calculi projecting over the upper and lower poles of the right kidney. No definite ureteral calculi. Similar appearance of multiple phleboliths in the left anatomic pelvis. Gas within non dilated colon.  IMPRESSION: Similar right nephrolithiasis, although overlying bowel gas obscures portions of the kidney. No definite urolithiasis.   Electronically Signed By: Margaretha Sheffield MD On: 02/29/2020 14:26  No results found for this or any previous visit.  No results found for this or any previous visit.  No results found for this or any previous visit.  No results found for this or any previous visit.  No results found for this or any previous visit.  No  results found for this or any previous visit.  Results for orders placed during the hospital encounter of 01/09/20  CT RENAL STONE STUDY  Narrative CLINICAL DATA:  Left-sided flank pain for several days  EXAM: CT ABDOMEN AND PELVIS WITHOUT CONTRAST  TECHNIQUE: Multidetector CT imaging of the abdomen and pelvis was performed following the standard protocol without IV contrast.  COMPARISON:  12/09/2007  FINDINGS: Lower chest: No acute abnormality.  Hepatobiliary: No focal liver abnormality is seen. No gallstones, gallbladder wall thickening, or biliary dilatation.  Pancreas: Unremarkable. No pancreatic ductal dilatation or surrounding inflammatory changes.  Spleen: Normal in size without focal abnormality.  Adrenals/Urinary Tract: Adrenal glands are within normal limits. Kidneys are well visualized bilaterally with bilateral renal calculi increased in size and number when compared with the prior exam. The largest of these on the right measures 4 mm in the upper pole. Largest of these on the left measures 6 mm in the midportion of the left kidney. Mild fullness of the collecting system and left ureter is seen. Some Peri ureteral stranding is noted. Two distal left ureteral stones are noted. The dominant stone  measures approximately 6 mm. The smaller adjacent stone measures 1-2 mm. This is best visualized on image number 81 of series 6. The bladder is decompressed.  Stomach/Bowel: The appendix is within normal limits. Colon shows no obstructive or inflammatory changes. Small bowel and stomach appear within normal limits as well.  Vascular/Lymphatic: Aortic atherosclerosis. No enlarged abdominal or pelvic lymph nodes.  Reproductive: Prostate is unremarkable.  Other: No abdominal wall hernia or abnormality. No abdominopelvic ascites.  Musculoskeletal: No acute or significant osseous findings.  IMPRESSION: Two distal left ureteral stones as described above causing mild hydronephrosis and hydroureter.  Bilateral nonobstructing renal stones as described.   Electronically Signed By: Inez Catalina M.D. On: 01/09/2020 09:44   Assessment & Plan:    1. Nephrolithiasis -RTC 3 months with KUB  2. Status post laser lithotripsy of ureteral calculus RTC 3 months with KUB - Urinalysis, Routine w reflex microscopic   No follow-ups on file.  Nicolette Bang, MD  [facility] [place]

## 2020-03-28 NOTE — Progress Notes (Signed)
Urological Symptom Review  Patient is experiencing the following symptoms: Kidney stones   Review of Systems  Gastrointestinal (upper)  : Negative for upper GI symptoms  Gastrointestinal (lower) : Negative for lower GI symptoms  Constitutional : Negative for symptoms  Skin: Negative for skin symptoms  Eyes: Negative for eye symptoms  Ear/Nose/Throat : Negative for Ear/Nose/Throat symptoms  Hematologic/Lymphatic: Negative for Hematologic/Lymphatic symptoms  Cardiovascular : Negative for cardiovascular symptoms  Respiratory : Negative for respiratory symptoms  Endocrine: Negative for endocrine symptoms  Musculoskeletal: Negative for musculoskeletal symptoms  Neurological: Negative for neurological symptoms  Psychologic: Negative for psychiatric symptoms

## 2020-03-28 NOTE — Patient Instructions (Signed)
Dietary Guidelines to Help Prevent Kidney Stones Kidney stones are deposits of minerals and salts that form inside your kidneys. Your risk of developing kidney stones may be greater depending on your diet, your lifestyle, the medicines you take, and whether you have certain medical conditions. Most people can reduce their chances of developing kidney stones by following the instructions below. Depending on your overall health and the type of kidney stones you tend to develop, your dietitian may give you more specific instructions. What are tips for following this plan? Reading food labels  Choose foods with "no salt added" or "low-salt" labels. Limit your sodium intake to less than 1500 mg per day.  Choose foods with calcium for each meal and snack. Try to eat about 300 mg of calcium at each meal. Foods that contain 200-500 mg of calcium per serving include: ? 8 oz (237 ml) of milk, fortified nondairy milk, and fortified fruit juice. ? 8 oz (237 ml) of kefir, yogurt, and soy yogurt. ? 4 oz (118 ml) of tofu. ? 1 oz of cheese. ? 1 cup (300 g) of dried figs. ? 1 cup (91 g) of cooked broccoli. ? 1-3 oz can of sardines or mackerel.  Most people need 1000 to 1500 mg of calcium each day. Talk to your dietitian about how much calcium is recommended for you. Shopping  Buy plenty of fresh fruits and vegetables. Most people do not need to avoid fruits and vegetables, even if they contain nutrients that may contribute to kidney stones.  When shopping for convenience foods, choose: ? Whole pieces of fruit. ? Premade salads with dressing on the side. ? Low-fat fruit and yogurt smoothies.  Avoid buying frozen meals or prepared deli foods.  Look for foods with live cultures, such as yogurt and kefir. Cooking  Do not add salt to food when cooking. Place a salt shaker on the table and allow each person to add his or her own salt to taste.  Use vegetable protein, such as beans, textured vegetable  protein (TVP), or tofu instead of meat in pasta, casseroles, and soups. Meal planning   Eat less salt, if told by your dietitian. To do this: ? Avoid eating processed or premade food. ? Avoid eating fast food.  Eat less animal protein, including cheese, meat, poultry, or fish, if told by your dietitian. To do this: ? Limit the number of times you have meat, poultry, fish, or cheese each week. Eat a diet free of meat at least 2 days a week. ? Eat only one serving each day of meat, poultry, fish, or seafood. ? When you prepare animal protein, cut pieces into small portion sizes. For most meat and fish, one serving is about the size of one deck of cards.  Eat at least 5 servings of fresh fruits and vegetables each day. To do this: ? Keep fruits and vegetables on hand for snacks. ? Eat 1 piece of fruit or a handful of berries with breakfast. ? Have a salad and fruit at lunch. ? Have two kinds of vegetables at dinner.  Limit foods that are high in a substance called oxalate. These include: ? Spinach. ? Rhubarb. ? Beets. ? Potato chips and french fries. ? Nuts.  If you regularly take a diuretic medicine, make sure to eat at least 1-2 fruits or vegetables high in potassium each day. These include: ? Avocado. ? Banana. ? Orange, prune, carrot, or tomato juice. ? Baked potato. ? Cabbage. ? Beans and split   peas. General instructions   Drink enough fluid to keep your urine clear or pale yellow. This is the most important thing you can do.  Talk to your health care provider and dietitian about taking daily supplements. Depending on your health and the cause of your kidney stones, you may be advised: ? Not to take supplements with vitamin C. ? To take a calcium supplement. ? To take a daily probiotic supplement. ? To take other supplements such as magnesium, fish oil, or vitamin B6.  Take all medicines and supplements as told by your health care provider.  Limit alcohol intake to no  more than 1 drink a day for nonpregnant women and 2 drinks a day for men. One drink equals 12 oz of beer, 5 oz of wine, or 1 oz of hard liquor.  Lose weight if told by your health care provider. Work with your dietitian to find strategies and an eating plan that works best for you. What foods are not recommended? Limit your intake of the following foods, or as told by your dietitian. Talk to your dietitian about specific foods you should avoid based on the type of kidney stones and your overall health. Grains Breads. Bagels. Rolls. Baked goods. Salted crackers. Cereal. Pasta. Vegetables Spinach. Rhubarb. Beets. Canned vegetables. Pickles. Olives. Meats and other protein foods Nuts. Nut butters. Large portions of meat, poultry, or fish. Salted or cured meats. Deli meats. Hot dogs. Sausages. Dairy Cheese. Beverages Regular soft drinks. Regular vegetable juice. Seasonings and other foods Seasoning blends with salt. Salad dressings. Canned soups. Soy sauce. Ketchup. Barbecue sauce. Canned pasta sauce. Casseroles. Pizza. Lasagna. Frozen meals. Potato chips. French fries. Summary  You can reduce your risk of kidney stones by making changes to your diet.  The most important thing you can do is drink enough fluid. You should drink enough fluid to keep your urine clear or pale yellow.  Ask your health care provider or dietitian how much protein from animal sources you should eat each day, and also how much salt and calcium you should have each day. This information is not intended to replace advice given to you by your health care provider. Make sure you discuss any questions you have with your health care provider. Document Revised: 07/21/2018 Document Reviewed: 03/11/2016 Elsevier Patient Education  2020 [employer]  

## 2020-04-10 ENCOUNTER — Other Ambulatory Visit: Payer: Self-pay

## 2020-04-10 DIAGNOSIS — N200 Calculus of kidney: Secondary | ICD-10-CM

## 2020-04-10 MED ORDER — TAMSULOSIN HCL 0.4 MG PO CAPS: ORAL_CAPSULE | ORAL | 0 refills | Status: DC

## 2020-06-08 ENCOUNTER — Encounter: Payer: Self-pay | Admitting: Family Medicine

## 2020-06-08 ENCOUNTER — Ambulatory Visit (INDEPENDENT_AMBULATORY_CARE_PROVIDER_SITE_OTHER): Payer: Medicare Other | Admitting: Family Medicine

## 2020-06-08 ENCOUNTER — Other Ambulatory Visit: Payer: Self-pay

## 2020-06-08 VITALS — BP 128/82 | HR 84 | Temp 97.7°F | Ht 72.0 in | Wt 262.8 lb

## 2020-06-08 DIAGNOSIS — I10 Essential (primary) hypertension: Secondary | ICD-10-CM

## 2020-06-08 MED ORDER — ENALAPRIL MALEATE 10 MG PO TABS: 10.0000 mg | ORAL_TABLET | Freq: Every day | ORAL | 1 refills | Status: DC

## 2020-06-08 NOTE — Progress Notes (Signed)
Patient ID: Fernando Riley, male    DOB: [birth year], 70 y.o.   MRN: [id number]   Chief Complaint  Patient presents with  . Hypertension   Subjective:    HPI   F/u htn-  HTN Pt compliant with BP meds.  No SEs Denies chest pain, sob, LE swelling, or blurry vision.   Had labs done this am.  Not resulted yet.  Taking flomax for h/o kidney stones.  Seeing urology.  htn - taking enalapril.  Medical History Davelle has a past medical history of Arthritis, Cat allergies, Fatty liver, History of kidney stones, History of migraine, Hypertension, Impaired fasting glucose, Kidney stone, Melanoma ([place]), OSA (obstructive sleep apnea), Peyronie disease, Pollen allergies, Reactive airways dysfunction syndrome ([place]), and Venous stasis.   Outpatient Encounter Medications as of 06/08/2020  Medication Sig  . acetaminophen (TYLENOL) 500 MG tablet Take 500 mg by mouth daily.  Marland Kitchen aspirin EC 81 MG tablet Take 81 mg by mouth daily.  . celecoxib (CELEBREX) 200 MG capsule Take 200 mg by mouth daily.  . enalapril (VASOTEC) 10 MG tablet Take 1 tablet (10 mg total) by mouth daily.  . ondansetron (ZOFRAN) 4 MG tablet Take 1 tablet (4 mg total) by mouth daily as needed for nausea or vomiting. (Patient not taking: Reported on 03/28/2020)  . oxyCODONE-acetaminophen (PERCOCET) 5-325 MG tablet Take 1 tablet by mouth every 4 (four) hours as needed for moderate pain or severe pain. (Patient not taking: Reported on 03/28/2020)  . tamsulosin (FLOMAX) 0.4 MG CAPS capsule TAKE 1 CAPSULE(0.4 MG) BY MOUTH DAILY  . Turmeric 500 MG CAPS Take 1,000 mg by mouth daily.   . [DISCONTINUED] enalapril (VASOTEC) 10 MG tablet TAKE 1 TABLET EVERY DAY   No facility-administered encounter medications on file as of 06/08/2020.     Review of Systems  Constitutional: Negative for chills and fever.  HENT: Negative for congestion, rhinorrhea and sore throat.   Respiratory: Negative for cough, shortness of breath and wheezing.    Cardiovascular: Negative for chest pain and leg swelling.  Gastrointestinal: Negative for abdominal pain, diarrhea, nausea and vomiting.  Genitourinary: Negative for dysuria and frequency.  Skin: Negative for rash.  Neurological: Negative for dizziness, weakness and headaches.     Vitals BP 128/82   Pulse 84   Temp 97.7 F (36.5 C) (Oral)   Ht 6' (1.829 m)   Wt 262 lb 12.8 oz (119.2 kg)   SpO2 96%   BMI 35.64 kg/m   Objective:   Physical Exam Vitals and nursing note reviewed.  Constitutional:      General: He is not in acute distress.    Appearance: Normal appearance. He is not ill-appearing.  Cardiovascular:     Rate and Rhythm: Normal rate and regular rhythm.     Pulses: Normal pulses.     Heart sounds: Normal heart sounds. No murmur heard.   Pulmonary:     Effort: Pulmonary effort is normal. No respiratory distress.     Breath sounds: Normal breath sounds.  Musculoskeletal:        General: Normal range of motion.  Skin:    General: Skin is warm and dry.     Findings: No rash.  Neurological:     General: No focal deficit present.     Mental Status: He is alert and oriented to person, place, and time.  Psychiatric:        Mood and Affect: Mood normal.        Behavior: Behavior  normal.        Thought Content: Thought content normal.        Judgment: Judgment normal.      Assessment and Plan   1. Essential hypertension, benign - enalapril (VASOTEC) 10 MG tablet; Take 1 tablet (10 mg total) by mouth daily.  Dispense: 90 tablet; Refill: 1   Will call pt with lab results.   htn-controlled, cont meds.  F/u 61mo or prn.

## 2020-06-09 LAB — CMP14+EGFR
ALT: 12 IU/L (ref 0–44)
AST: 17 IU/L (ref 0–40)
Albumin/Globulin Ratio: 1.7 (ref 1.2–2.2)
Albumin: 4 g/dL (ref 3.8–4.8)
Alkaline Phosphatase: 97 IU/L (ref 44–121)
BUN/Creatinine Ratio: 17 (ref 10–24)
BUN: 17 mg/dL (ref 8–27)
Bilirubin Total: 0.7 mg/dL (ref 0.0–1.2)
CO2: 23 mmol/L (ref 20–29)
Calcium: 8.8 mg/dL (ref 8.6–10.2)
Chloride: 103 mmol/L (ref 96–106)
Creatinine, Ser: 1 mg/dL (ref 0.76–1.27)
GFR calc Af Amer: 90 mL/min/{1.73_m2} (ref >59)
GFR calc non Af Amer: 78 mL/min/{1.73_m2} (ref >59)
Globulin, Total: 2.4 g/dL (ref 1.5–4.5)
Glucose: 98 mg/dL (ref 65–99)
Potassium: 4.3 mmol/L (ref 3.5–5.2)
Sodium: 139 mmol/L (ref 134–144)
Total Protein: 6.4 g/dL (ref 6.0–8.5)

## 2020-06-09 LAB — LIPID PANEL
Chol/HDL Ratio: 3.6 ratio (ref 0.0–5.0)
Cholesterol, Total: 182 mg/dL (ref 100–199)
HDL: 51 mg/dL (ref >39)
LDL Chol Calc (NIH): 113 mg/dL — ABNORMAL HIGH (ref 0–99)
Triglycerides: 99 mg/dL (ref 0–149)
VLDL Cholesterol Cal: 18 mg/dL (ref 5–40)

## 2020-06-09 LAB — CBC
Hematocrit: 43.1 % (ref 37.5–51.0)
Hemoglobin: 14.6 g/dL (ref 13.0–17.7)
MCH: 31.3 pg (ref 26.6–33.0)
MCHC: 33.9 g/dL (ref 31.5–35.7)
MCV: 92 fL (ref 79–97)
Platelets: 241 10*3/uL (ref 150–450)
RBC: 4.67 x10E6/uL (ref 4.14–5.80)
RDW: 12.4 % (ref 11.6–15.4)
WBC: 6.3 10*3/uL (ref 3.4–10.8)

## 2020-06-09 LAB — PSA: Prostate Specific Ag, Serum: 0.2 ng/mL (ref 0.0–4.0)

## 2020-06-19 ENCOUNTER — Ambulatory Visit: Payer: Medicare Other | Admitting: Urology

## 2020-06-27 ENCOUNTER — Ambulatory Visit (INDEPENDENT_AMBULATORY_CARE_PROVIDER_SITE_OTHER): Payer: Medicare Other | Admitting: Urology

## 2020-06-27 ENCOUNTER — Encounter: Payer: Self-pay | Admitting: Urology

## 2020-06-27 ENCOUNTER — Other Ambulatory Visit: Payer: Self-pay

## 2020-06-27 ENCOUNTER — Ambulatory Visit (HOSPITAL_COMMUNITY)
Admission: RE | Admit: 2020-06-27 | Discharge: 2020-06-27 | Disposition: A | Discharge status: Home / Self Care | Payer: Medicare Other | Source: Ambulatory Visit | Attending: Urology | Admitting: Urology

## 2020-06-27 VITALS — BP 133/76 | HR 69 | Temp 97.9°F | Ht 72.0 in | Wt 262.0 lb

## 2020-06-27 DIAGNOSIS — N200 Calculus of kidney: Secondary | ICD-10-CM | POA: Insufficient documentation

## 2020-06-27 LAB — URINALYSIS, ROUTINE W REFLEX MICROSCOPIC
Bilirubin, UA: NEGATIVE
Glucose, UA: NEGATIVE
Ketones, UA: NEGATIVE
Leukocytes,UA: NEGATIVE
Nitrite, UA: NEGATIVE
Protein,UA: NEGATIVE
RBC, UA: NEGATIVE
Specific Gravity, UA: >1.03 — ABNORMAL HIGH (ref 1.005–1.030)
Urobilinogen, Ur: 0.2 mg/dL (ref 0.2–1.0)
pH, UA: 5.5 (ref 5.0–7.5)

## 2020-06-27 NOTE — Patient Instructions (Signed)

## 2020-06-27 NOTE — Progress Notes (Signed)
Urological Symptom Review  Patient is experiencing the following symptoms: Kidney stones   Review of Systems  Gastrointestinal (upper)  : Negative for upper GI symptoms  Gastrointestinal (lower) : Negative for lower GI symptoms  Constitutional : Negative for symptoms  Skin: Negative for skin symptoms  Eyes: Negative for eye symptoms  Ear/Nose/Throat : Negative for Ear/Nose/Throat symptoms  Hematologic/Lymphatic: Negative for Hematologic/Lymphatic symptoms  Cardiovascular : Negative for cardiovascular symptoms  Respiratory : Negative for respiratory symptoms  Endocrine: Negative for endocrine symptoms  Musculoskeletal: Negative for musculoskeletal symptoms  Neurological: Negative for neurological symptoms  Psychologic: Negative for psychiatric symptoms

## 2020-06-27 NOTE — Progress Notes (Signed)
06/27/2020 8:38 AM   Fernando Riley Mar 27, 1951 [id number]  Referring provider: Erven Colla, DO [address] [place],  Fernando Riley [address]  followup nephrolithiasis  HPI: Mr Fernando Riley is a 70yo here for followup for nephrolithiasis. He has not passed any stone fragments since last visit. No flank pain. No significant LUTS. KUB from today shows a stable rigth 1-12mm renal calculus.    PMH: Past Medical History:  Diagnosis Date  . Arthritis   . Cat allergies   . Fatty liver   . History of kidney stones   . History of migraine   . Hypertension   . Impaired fasting glucose   . Kidney stone   . Melanoma (Fernando Riley)    lower legs bilaterally  . OSA (obstructive sleep apnea)    unable to sleep with use of CPAP  . Peyronie disease   . Pollen allergies   . Reactive airways dysfunction syndrome (Fernando Riley)   . Venous stasis     Surgical History: Past Surgical History:  Procedure Laterality Date  . COLONOSCOPY N/A 01/21/2016   Procedure: COLONOSCOPY;  Surgeon: Daneil Dolin, MD;  Location: [facility];  Service: Endoscopy;  Laterality: N/A;  1:00 PM - moved to 12:30 - office notified per Tretha Sciara  . EXTRACORPOREAL SHOCK WAVE LITHOTRIPSY Left 01/17/2020   Procedure: EXTRACORPOREAL SHOCK WAVE LITHOTRIPSY (ESWL);  Surgeon: Cleon Gustin, MD;  Location: [facility];  Service: Urology;  Laterality: Left;  . EXTRACORPOREAL SHOCK WAVE LITHOTRIPSY Right 02/14/2020   Procedure: EXTRACORPOREAL SHOCK WAVE LITHOTRIPSY (ESWL);  Surgeon: Cleon Gustin, MD;  Location: [facility];  Service: Urology;  Laterality: Right;  . KNEE ARTHROSCOPY Bilateral   . POLYPECTOMY  01/21/2016   Procedure: POLYPECTOMY;  Surgeon: Daneil Dolin, MD;  Location: [facility];  Service: Endoscopy;;  colon  . SHOULDER ARTHROSCOPY Left   . SHOULDER SURGERY Right 2016  . TOTAL KNEE ARTHROPLASTY Right 01/25/2019   Procedure: TOTAL KNEE ARTHROPLASTY;  Surgeon: Paralee Cancel, MD;  Location: [facility];  Service: Orthopedics;  Laterality:  Right;  70 mins  . TOTAL KNEE ARTHROPLASTY Left 03/22/2019   Procedure: TOTAL KNEE ARTHROPLASTY;  Surgeon: Paralee Cancel, MD;  Location: [facility];  Service: Orthopedics;  Laterality: Left;  70 mins    Home Medications:  Allergies as of 06/27/2020      Reactions   Other    Cats   Red Dye Hives   Pt's wife reported he gets hives with "food" red dye, but can take tablets that are red or pink.       Medication List       Accurate as of June 27, 2020  8:38 AM. If you have any questions, ask your nurse or doctor.        acetaminophen 500 MG tablet Commonly known as: TYLENOL Take 500 mg by mouth daily.   aspirin EC 81 MG tablet Take 81 mg by mouth daily.   celecoxib 200 MG capsule Commonly known as: CELEBREX Take 200 mg by mouth daily.   enalapril 10 MG tablet Commonly known as: VASOTEC Take 1 tablet (10 mg total) by mouth daily.   ondansetron 4 MG tablet Commonly known as: Zofran Take 1 tablet (4 mg total) by mouth daily as needed for nausea or vomiting.   oxyCODONE-acetaminophen 5-325 MG tablet Commonly known as: Percocet Take 1 tablet by mouth every 4 (four) hours as needed for moderate pain or severe pain.   tamsulosin 0.4 MG Caps capsule Commonly known as:  FLOMAX TAKE 1 CAPSULE(0.4 MG) BY MOUTH DAILY   Turmeric 500 MG Caps Take 1,000 mg by mouth daily.       Allergies:  Allergies  Allergen Reactions  . Other     Cats  . Red Dye Hives    Pt's wife reported he gets hives with "food" red dye, but can take tablets that are red or pink.     Family History: Family History  Problem Relation Age of Onset  . Heart disease Father   . Diabetes Brother   . Hyperlipidemia Brother     Social History:  reports that he has never smoked. He has never used smokeless tobacco. He reports that he does not drink alcohol and does not use drugs.  ROS: All other review of systems were reviewed and are negative except what is noted above in HPI  Physical Exam: BP 133/76    Pulse 69   Temp 97.9 F (36.6 C)   Ht 6' (1.829 m)   Wt 262 lb (118.8 kg)   BMI 35.53 kg/m   Constitutional:  Alert and oriented, No acute distress. HEENT: [place] AT, moist mucus membranes.  Trachea midline, no masses. Cardiovascular: No clubbing, cyanosis, or edema. Respiratory: Normal respiratory effort, no increased work of breathing. GI: Abdomen is soft, nontender, nondistended, no abdominal masses GU: No CVA tenderness.  Lymph: No cervical or inguinal lymphadenopathy. Skin: No rashes, bruises or suspicious lesions. Neurologic: Grossly intact, no focal deficits, moving all 4 extremities. Psychiatric: Normal mood and affect.  Laboratory Data: Lab Results  Component Value Date   WBC 6.3 06/08/2020   HGB 14.6 06/08/2020   HCT 43.1 06/08/2020   MCV 92 06/08/2020   PLT 241 06/08/2020    Lab Results  Component Value Date   CREATININE 1.00 06/08/2020    Lab Results  Component Value Date   PSA 0.2 07/17/2014   PSA 0.31 07/11/2013    No results found for: TESTOSTERONE  No results found for: HGBA1C  Urinalysis    Component Value Date/Time   APPEARANCEUR Clear 03/28/2020 0900   GLUCOSEU Negative 03/28/2020 0900   BILIRUBINUR Negative 03/28/2020 0900   PROTEINUR Trace (A) 03/28/2020 0900   UROBILINOGEN negative (A) 06/14/2019 1549   NITRITE Negative 03/28/2020 0900   LEUKOCYTESUR Negative 03/28/2020 0900    Lab Results  Component Value Date   LABMICR Comment 03/28/2020   WBCUA None seen 01/09/2020   LABEPIT 0-10 01/09/2020   MUCUS Present 01/09/2020   BACTERIA Many (A) 01/09/2020    Pertinent Imaging: KUB today: Images reviewed and discussed with the patient Results for orders placed during the hospital encounter of 02/28/20  Abdomen 1 view (KUB)  Narrative CLINICAL DATA:  History of recent right lithotripsy.  EXAM: ABDOMEN - 1 VIEW  COMPARISON:  February 14, 2020.  FINDINGS: Overlying bowel gas obscures portion of the right kidney, but otherwise  similar appearance of multiple calculi projecting over the upper and lower poles of the right kidney. No definite ureteral calculi. Similar appearance of multiple phleboliths in the left anatomic pelvis. Gas within non dilated colon.  IMPRESSION: Similar right nephrolithiasis, although overlying bowel gas obscures portions of the kidney. No definite urolithiasis.   Electronically Signed By: Margaretha Sheffield MD On: 02/29/2020 14:26  No results found for this or any previous visit.  No results found for this or any previous visit.  No results found for this or any previous visit.  No results found for this or any previous visit.  No  results found for this or any previous visit.  No results found for this or any previous visit.  Results for orders placed during the hospital encounter of 01/09/20  CT RENAL STONE STUDY  Narrative CLINICAL DATA:  Left-sided flank pain for several days  EXAM: CT ABDOMEN AND PELVIS WITHOUT CONTRAST  TECHNIQUE: Multidetector CT imaging of the abdomen and pelvis was performed following the standard protocol without IV contrast.  COMPARISON:  12/09/2007  FINDINGS: Lower chest: No acute abnormality.  Hepatobiliary: No focal liver abnormality is seen. No gallstones, gallbladder wall thickening, or biliary dilatation.  Pancreas: Unremarkable. No pancreatic ductal dilatation or surrounding inflammatory changes.  Spleen: Normal in size without focal abnormality.  Adrenals/Urinary Tract: Adrenal glands are within normal limits. Kidneys are well visualized bilaterally with bilateral renal calculi increased in size and number when compared with the prior exam. The largest of these on the right measures 4 mm in the upper pole. Largest of these on the left measures 6 mm in the midportion of the left kidney. Mild fullness of the collecting system and left ureter is seen. Some Peri ureteral stranding is noted. Two distal left ureteral stones are  noted. The dominant stone measures approximately 6 mm. The smaller adjacent stone measures 1-2 mm. This is best visualized on image number 81 of series 6. The bladder is decompressed.  Stomach/Bowel: The appendix is within normal limits. Colon shows no obstructive or inflammatory changes. Small bowel and stomach appear within normal limits as well.  Vascular/Lymphatic: Aortic atherosclerosis. No enlarged abdominal or pelvic lymph nodes.  Reproductive: Prostate is unremarkable.  Other: No abdominal wall hernia or abnormality. No abdominopelvic ascites.  Musculoskeletal: No acute or significant osseous findings.  IMPRESSION: Two distal left ureteral stones as described above causing mild hydronephrosis and hydroureter.  Bilateral nonobstructing renal stones as described.   Electronically Signed By: Inez Catalina M.D. On: 01/09/2020 09:44   Assessment & Plan:    1. Nephrolithiasis -RTC 6 months with KUB. ditary handout given - Urinalysis, Routine w reflex microscopic   No follow-ups on file.  Nicolette Bang, MD  [facility]

## 2020-06-29 ENCOUNTER — Other Ambulatory Visit: Payer: Self-pay | Admitting: Urology

## 2020-06-29 DIAGNOSIS — N200 Calculus of kidney: Secondary | ICD-10-CM

## 2020-07-30 ENCOUNTER — Ambulatory Visit: Payer: Medicare Other | Admitting: Family Medicine

## 2020-08-13 ENCOUNTER — Ambulatory Visit: Payer: Medicare Other | Admitting: Family Medicine

## 2020-08-15 ENCOUNTER — Other Ambulatory Visit: Payer: Self-pay

## 2020-08-15 ENCOUNTER — Encounter: Payer: Self-pay | Admitting: Family Medicine

## 2020-08-15 ENCOUNTER — Ambulatory Visit (INDEPENDENT_AMBULATORY_CARE_PROVIDER_SITE_OTHER): Payer: Medicare Other | Admitting: Family Medicine

## 2020-08-15 VITALS — BP 138/82 | HR 69 | Temp 97.2°F | Ht 72.0 in | Wt 262.2 lb

## 2020-08-15 DIAGNOSIS — G4733 Obstructive sleep apnea (adult) (pediatric): Secondary | ICD-10-CM

## 2020-08-15 DIAGNOSIS — I10 Essential (primary) hypertension: Secondary | ICD-10-CM | POA: Diagnosis not present

## 2020-08-15 NOTE — Progress Notes (Signed)
Patient ID: Fernando Riley, male    DOB: Feb 12, 1951, 70 y.o.   MRN: [id number]   Chief Complaint  Patient presents with  . Hypertension   Subjective:    HPI Pt here for follow up on blood pressure. Pt states he is doing well. Checks blood pressure at home and takes meds as directed. Pt is wanting to know how he can his dx of sleep apnea "off of his record."   Pt went to have sleep study, had per pt "slight concern" for sleep apnea.  Has a cpap machine and not using it.  Pt feeling since wife not complaining of snoring that he doesn't have apnea anymore. They sent another mask and pt stating not tolerating it. Pt feeling drying him out or "suffocatting" feeling.  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 Pt not using cpap.  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  Last Dr. Arsenio Katz, neuro, read last sleep study in 2017.  Said moderate sleep apnea.  HTN Pt compliant with BP meds.  No SEs Denies chest pain, sob, LE swelling, or blurry vision.   Medical History Fernando Riley has a past medical history of Arthritis, Cat allergies, Fatty liver, History of kidney stones, History of migraine, Hypertension, Impaired fasting glucose, Kidney stone, Melanoma (Fernando Riley), OSA (obstructive sleep apnea), Peyronie disease, Pollen allergies, Reactive airways dysfunction syndrome (Fernando Riley), and Venous stasis.   Outpatient Encounter Medications as of 08/15/2020  Medication Sig  . acetaminophen (TYLENOL) 500 MG tablet Take 500 mg by mouth daily.  Marland Kitchen aspirin EC 81 MG tablet Take 81 mg by mouth daily.  . enalapril (VASOTEC) 10 MG tablet Take 1 tablet (10 mg total) by mouth daily.  . ondansetron (ZOFRAN) 4 MG tablet Take 1 tablet (4 mg total) by mouth daily as needed for nausea or vomiting.  . tamsulosin (FLOMAX) 0.4 MG CAPS capsule TAKE 1 CAPSULE EVERY DAY  . Turmeric 500 MG CAPS Take 1,000 mg by mouth daily.   . [DISCONTINUED] celecoxib  (CELEBREX) 200 MG capsule Take 200 mg by mouth daily.  . [DISCONTINUED] oxyCODONE-acetaminophen (PERCOCET) 5-325 MG tablet Take 1 tablet by mouth every 4 (four) hours as needed for moderate pain or severe pain. (Patient not taking: No sig reported)   No facility-administered encounter medications on file as of 08/15/2020.     Review of Systems  Constitutional: Negative for chills and fever.  HENT: Negative for congestion, rhinorrhea and sore throat.   Respiratory: Negative for cough, shortness of breath and wheezing.   Cardiovascular: Negative for chest pain and leg swelling.  Gastrointestinal: Negative for abdominal pain, diarrhea, nausea and vomiting.  Genitourinary: Negative for dysuria and frequency.  Skin: Negative for rash.  Neurological: Negative for dizziness, weakness and headaches.     Vitals BP 138/82   Pulse 69   Temp (!) 97.2 F (36.2 C)   Ht 6' (1.829 m)   Wt 262 lb 3.2 oz (118.9 kg)   SpO2 95%   BMI 35.56 kg/m   Objective:   Physical Exam Vitals and nursing note reviewed.  Constitutional:      General: He is not in acute distress.    Appearance: Normal appearance. He is not ill-appearing.  Cardiovascular:     Rate and Rhythm: Normal rate and regular rhythm.     Pulses: Normal pulses.     Heart sounds: Normal heart sounds.  Pulmonary:  Effort: Pulmonary effort is normal. No respiratory distress.     Breath sounds: Normal breath sounds.  Musculoskeletal:        General: Normal range of motion.  Skin:    General: Skin is warm and dry.     Findings: No rash.  Neurological:     General: No focal deficit present.     Mental Status: He is alert and oriented to person, place, and time.  Psychiatric:        Mood and Affect: Mood normal.        Behavior: Behavior normal.        Thought Content: Thought content normal.        Judgment: Judgment normal.      Assessment and Plan   1. Essential hypertension, benign  2. Obstructive sleep apnea    htn- suboptimal. Cont to dec salt in diet and exercise.  Cont meds.  OSA- Pt to call back in next 2-3 months if wanting to repeat sleep study or see Dr. Merlene Riley.  Discussed his OSA and if pt can increase exercising and lose about 10-20% weight could help improve his apnea.  Would require repeating sleep study or discussion with Dr. Merlene Riley about his sleep apnea status.  Not able to remove from chart unless has new study stating he does not have apnea. Discussed risk of pt not using cpap and driving heavy machinery could cause accident and harm himself or others.   Pt voiced understanding.   Return in about 6 months (around 02/15/2021) for f/u htn.

## 2020-08-26 ENCOUNTER — Encounter: Payer: Self-pay | Admitting: Family Medicine

## 2020-08-31 NOTE — Progress Notes (Signed)
Subjective:   MCCARTNEY MONTOUR is a 70 y.o. male who presents for Medicare Annual/Subsequent preventive examination.  I connected with Avinash Griffee today by telephone and verified that I am speaking with the correct person using two identifiers. Location patient: home Location provider: work Persons participating in the virtual visit: patient, provider.   I discussed the limitations, risks, security and privacy concerns of performing an evaluation and management service by telephone and the availability of in person appointments. I also discussed with the patient that there may be a patient responsible charge related to this service. The patient expressed understanding and verbally consented to this telephonic visit.    Interactive audio and video telecommunications were attempted between this provider and patient, however failed, due to patient having technical difficulties OR patient did not have access to video capability.  We continued and completed visit with audio only.      Review of Systems    N/A Cardiac Risk Factors include: advanced age (>45men, >51 women);male gender;hypertension     Objective:    Today's Vitals   09/04/20 0823  PainSc: 4    There is no height or weight on file to calculate BMI.  Advanced Directives 09/04/2020 02/14/2020 02/07/2020 03/22/2019 03/22/2019 03/21/2019 01/25/2019  Does Patient Have a Medical Advance Directive? Yes Yes Yes Yes Yes Yes Yes  Type of Paramedic of [place];Living will [place];Living will [place];Living will [place];Living will [place];Living will [place];Living will [place]  Does patient want to make changes to medical advance directive? No - Patient declined No - Patient declined No - Patient declined No - Patient declined No - Patient declined No - Patient declined No - Guardian  declined  Copy of [place] in Chart? No - copy requested No - copy requested No - copy requested No - copy requested No - copy requested No - copy requested No - copy requested    Current Medications (verified) Outpatient Encounter Medications as of 09/04/2020  Medication Sig  . acetaminophen (TYLENOL) 500 MG tablet Take 500 mg by mouth daily.  Marland Kitchen aspirin EC 81 MG tablet Take 81 mg by mouth daily.  . enalapril (VASOTEC) 10 MG tablet Take 1 tablet (10 mg total) by mouth daily.  . ondansetron (ZOFRAN) 4 MG tablet Take 1 tablet (4 mg total) by mouth daily as needed for nausea or vomiting.  . tamsulosin (FLOMAX) 0.4 MG CAPS capsule TAKE 1 CAPSULE EVERY DAY  . Turmeric 500 MG CAPS Take 1,000 mg by mouth daily.    No facility-administered encounter medications on file as of 09/04/2020.    Allergies (verified) Other and Red dye   History: Past Medical History:  Diagnosis Date  . Arthritis   . Cat allergies   . Fatty liver   . History of kidney stones   . History of migraine   . Hypertension   . Impaired fasting glucose   . Kidney stone   . Melanoma ([place])    lower legs bilaterally  . OSA (obstructive sleep apnea)    unable to sleep with use of CPAP  . Peyronie disease   . Pollen allergies   . Reactive airways dysfunction syndrome ([place])   . Venous stasis    Past Surgical History:  Procedure Laterality Date  . COLONOSCOPY N/A 01/21/2016   Procedure: COLONOSCOPY;  Surgeon: Daneil Dolin, MD;  [facility];  Service: Endoscopy;  Laterality: N/A;  1:00 PM - moved to 12:30 - office notified per Tretha Sciara  . EXTRACORPOREAL SHOCK WAVE LITHOTRIPSY Left 01/17/2020   Procedure: EXTRACORPOREAL SHOCK WAVE LITHOTRIPSY (ESWL);  Surgeon: Cleon Gustin, MD;  Location: [facility];  Service: Urology;  Laterality: Left;  . EXTRACORPOREAL SHOCK WAVE LITHOTRIPSY Right 02/14/2020   Procedure: EXTRACORPOREAL SHOCK WAVE LITHOTRIPSY (ESWL);  Surgeon: Cleon Gustin, MD;   Location: [facility];  Service: Urology;  Laterality: Right;  . KNEE ARTHROSCOPY Bilateral   . POLYPECTOMY  01/21/2016   Procedure: POLYPECTOMY;  Surgeon: Daneil Dolin, MD;  [facility];  Service: Endoscopy;;  colon  . SHOULDER ARTHROSCOPY Left   . SHOULDER SURGERY Right 2016  . TOOTH EXTRACTION    . TOTAL KNEE ARTHROPLASTY Right 01/25/2019   Procedure: TOTAL KNEE ARTHROPLASTY;  Surgeon: Paralee Cancel, MD;  Location: [facility];  Service: Orthopedics;  Laterality: Right;  70 mins  . TOTAL KNEE ARTHROPLASTY Left 03/22/2019   Procedure: TOTAL KNEE ARTHROPLASTY;  Surgeon: Paralee Cancel, MD;  Location: [facility];  Service: Orthopedics;  Laterality: Left;  70 mins   Family History  Problem Relation Age of Onset  . Heart disease Father   . Diabetes Brother   . Hyperlipidemia Brother    Social History   Socioeconomic History  . Marital status: Married    Spouse name: Not on file  . Number of children: Not on file  . Years of education: Not on file  . Highest education level: Not on file  Occupational History  . Not on file  Tobacco Use  . Smoking status: Never Smoker  . Smokeless tobacco: Never Used  Vaping Use  . Vaping Use: Never used  Substance and Sexual Activity  . Alcohol use: No  . Drug use: No  . Sexual activity: Not on file  Other Topics Concern  . Not on file  Social History Narrative  . Not on file   Social Determinants of Health   Financial Resource Strain: Low Risk   . Difficulty of Paying Living Expenses: Not hard at all  Food Insecurity: No Food Insecurity  . Worried About Charity fundraiser in the Last Year: Never true  . Ran Out of Food in the Last Year: Never true  Transportation Needs: No Transportation Needs  . Lack of Transportation (Medical): No  . Lack of Transportation (Non-Medical): No  Physical Activity: Inactive  . Days of Exercise per Week: 0 days  . Minutes of Exercise per Session: 0 min  Stress: No Stress Concern Present  . Feeling of  Stress : Not at all  Social Connections: Socially Integrated  . Frequency of Communication with Friends and Family: More than three times a week  . Frequency of Social Gatherings with Friends and Family: More than three times a week  . Attends Religious Services: More than 4 times per year  . Active Member of Clubs or Organizations: Yes  . Attends Archivist Meetings: More than 4 times per year  . Marital Status: Married    Tobacco Counseling Counseling given: Not Answered   Clinical Intake:  Pre-visit preparation completed: Yes  Pain : 0-10 Pain Score: 4  Pain Type: Acute pain Pain Location: Heel Pain Orientation: Left Pain Descriptors / Indicators: Aching Pain Onset: 1 to 4 weeks ago Pain Relieving Factors: Resting  Pain Relieving Factors: Resting  Nutritional Risks: None Diabetes: No  How often do you need to have someone help you when  you read instructions, pamphlets, or other written materials from your doctor or pharmacy?: 1 - Never  Diabetic?No  Interpreter Needed?: No  Information entered by :: [place] of Daily Living In your present state of health, do you have any difficulty performing the following activities: 09/04/2020 02/07/2020  Hearing? N N  Vision? N N  Difficulty concentrating or making decisions? N N  Walking or climbing stairs? N N  Dressing or bathing? N N  Doing errands, shopping? N N  Preparing Food and eating ? N -  Using the Toilet? N -  In the past six months, have you accidently leaked urine? N -  Do you have problems with loss of bowel control? N -  Managing your Medications? N -  Managing your Finances? N -  Housekeeping or managing your Housekeeping? N -  Some recent data might be hidden    Patient Care Team: Erven Colla, DO as PCP - General (Family Medicine)  Indicate any recent Medical Services you may have received from other than [facility] providers in the past year (date may be approximate).      Assessment:   This is a routine wellness examination for [place].  Hearing/Vision screen  Hearing Screening   125Hz  250Hz  500Hz  1000Hz  2000Hz  3000Hz  4000Hz  6000Hz  8000Hz   Right ear:           Left ear:           Vision Screening Comments: Patient states has not had eye exam in a few years. Currently wears glasses   Dietary issues and exercise activities discussed: Current Exercise Habits: The patient does not participate in regular exercise at present, Exercise limited by: None identified  Goals Addressed            This Visit's Progress   . Weight (lb) < 200 lb (90.7 kg)        Depression Screen PHQ 2/9 Scores 09/04/2020 08/15/2020 06/08/2020 08/29/2019 09/14/2017 08/04/2016  PHQ - 2 Score 0 0 0 0 0 0    Fall Risk Fall Risk  09/04/2020 08/15/2020 08/29/2019 09/14/2017 08/04/2016  Falls in the past year? 0 0 0 Yes No  Number falls in past yr: 0 0 - 1 -  Injury with Fall? 0 0 - Yes -  Risk for fall due to : No Fall Risks No Fall Risks - - -  Follow up Falls evaluation completed;Falls prevention discussed Falls evaluation completed Falls evaluation completed Education provided -    FALL RISK PREVENTION PERTAINING TO THE HOME:  Any stairs in or around the home? Yes  If so, are there any without handrails? No  Home free of loose throw rugs in walkways, pet beds, electrical cords, etc? Yes  Adequate lighting in your home to reduce risk of falls? Yes   ASSISTIVE DEVICES UTILIZED TO PREVENT FALLS:  Life alert? No  Use of a cane, walker or w/c? No  Grab bars in the bathroom? Yes  Shower chair or bench in shower? No  Elevated toilet seat or a handicapped toilet? Yes     Cognitive Function:   Normal cognitive status assessed by direct observation by this Nurse Health Advisor. No abnormalities found.        Immunizations Immunization History  Administered Date(s) Administered  . Influenza,inj,Quad PF,6+ Mos 01/12/2017, 01/16/2018, 01/12/2019  . Influenza,inj,quad, With  Preservative 01/16/2018  . Influenza-Unspecified 01/28/2016, 01/12/2017, 01/18/2020  . Pneumococcal Conjugate-13 01/12/2019  . Pneumococcal Polysaccharide-23 09/14/2017  . Td 02/08/2008  . Zoster  Recombinat (Shingrix) 06/16/2016    TDAP status: Due, Education has been provided regarding the importance of this vaccine. Advised may receive this vaccine at local pharmacy or Health Dept. Aware to provide a copy of the vaccination record if obtained from local pharmacy or Health Dept. Verbalized acceptance and understanding.  Flu Vaccine status: Up to date  Pneumococcal vaccine status: Up to date  Covid-19 vaccine status: Declined, Education has been provided regarding the importance of this vaccine but patient still declined. Advised may receive this vaccine at local pharmacy or Health Dept.or vaccine clinic. Aware to provide a copy of the vaccination record if obtained from local pharmacy or Health Dept. Verbalized acceptance and understanding.  Qualifies for Shingles Vaccine? Yes   Zostavax completed No   Shingrix Completed?: Yes  Screening Tests Health Maintenance  Topic Date Due  . COVID-19 Vaccine (1) Never done  . Hepatitis C Screening  Never done  . TETANUS/TDAP  02/07/2018  . INFLUENZA VACCINE  11/12/2020  . COLONOSCOPY (Pts 45-63yrs Insurance coverage will need to be confirmed)  01/20/2026  . PNA vac Low Risk Adult  Completed  . HPV VACCINES  Aged Out    Health Maintenance  Health Maintenance Due  Topic Date Due  . COVID-19 Vaccine (1) Never done  . Hepatitis C Screening  Never done  . TETANUS/TDAP  02/07/2018    Colorectal cancer screening: Type of screening: Colonoscopy. Completed 01/21/2016. Repeat every 10 years  Lung Cancer Screening: (Low Dose CT Chest recommended if Age 34-80 years, 30 pack-year currently smoking OR have quit w/in 15years.) does not qualify.   Lung Cancer Screening Referral: N/A   Additional Screening:  Hepatitis C Screening: does qualify;    Vision Screening: Recommended annual ophthalmology exams for early detection of glaucoma and other disorders of the eye. Is the patient up to date with their annual eye exam?  No  Who is the provider or what is the name of the office in which the patient attends annual eye exams? Dr. Teresa Coombs  If pt is not established with a provider, would they like to be referred to a provider to establish care? No .   Dental Screening: Recommended annual dental exams for proper oral hygiene  Community Resource Referral / Chronic Care Management: CRR required this visit?  No   CCM required this visit?  No      Plan:     I have personally reviewed and noted the following in the patient's chart:   . Medical and social history . Use of alcohol, tobacco or illicit drugs  . Current medications and supplements including opioid prescriptions. Patient is not currently taking opioid prescriptions. . Functional ability and status . Nutritional status . Physical activity . Advanced directives . List of other physicians . Hospitalizations, surgeries, and ER visits in previous 12 months . Vitals . Screenings to include cognitive, depression, and falls . Referrals and appointments  In addition, I have reviewed and discussed with patient certain preventive protocols, quality metrics, and best practice recommendations. A written personalized care plan for preventive services as well as general preventive health recommendations were provided to patient.     Ofilia Neas, LPN   [id number]   Nurse Notes: None

## 2020-09-04 ENCOUNTER — Ambulatory Visit (INDEPENDENT_AMBULATORY_CARE_PROVIDER_SITE_OTHER): Payer: Medicare Other

## 2020-09-04 DIAGNOSIS — Z0000 Encounter for general adult medical examination without abnormal findings: Secondary | ICD-10-CM

## 2020-09-04 NOTE — Patient Instructions (Addendum)
Fernando Riley , Thank you for taking time to come for your Medicare Wellness Visit. I appreciate your ongoing commitment to your health goals. Please review the following plan we discussed and let me know if I can assist you in the future.   Screening recommendations/referrals: Colonoscopy: Up to date, next due 01/20/2026 Recommended yearly ophthalmology/optometry visit for glaucoma screening and checkup Recommended yearly dental visit for hygiene and checkup  Vaccinations: Influenza vaccine: Up to date, next due fall 2022  Pneumococcal vaccine: Completed series  Tdap vaccine: Currently due, if you would like to receive we recommend that you do so at your local pharmacy Shingles vaccine: Currently due for your second shingrix, you may receive your second dose at your local pharmacy or you may check with [facility] to see if you had the second dose     Advanced directives: Please bring copies of your advanced medical directives so that we may scan them into your chart.    Conditions/risks identified: none   Next appointment: None   Preventive Care 44 Years and Older, Male Preventive care refers to lifestyle choices and visits with your health care provider that can promote health and wellness. What does preventive care include?  A yearly physical exam. This is also called an annual well check.  Dental exams once or twice a year.  Routine eye exams. Ask your health care provider how often you should have your eyes checked.  Personal lifestyle choices, including:  Daily care of your teeth and gums.  Regular physical activity.  Eating a healthy diet.  Avoiding tobacco and drug use.  Limiting alcohol use.  Practicing safe sex.  Taking low doses of aspirin every day.  Taking vitamin and mineral supplements as recommended by your health care provider. What happens during an annual well check? The services and screenings done by your health care provider during your annual well  check will depend on your age, overall health, lifestyle risk factors, and family history of disease. Counseling  Your health care provider may ask you questions about your:  Alcohol use.  Tobacco use.  Drug use.  Emotional well-being.  Home and relationship well-being.  Sexual activity.  Eating habits.  History of falls.  Memory and ability to understand (cognition).  Work and work Statistician. Screening  You may have the following tests or measurements:  Height, weight, and BMI.  Blood pressure.  Lipid and cholesterol levels. These may be checked every 5 years, or more frequently if you are over 72 years old.  Skin check.  Lung cancer screening. You may have this screening every year starting at age 36 if you have a 30-pack-year history of smoking and currently smoke or have quit within the past 15 years.  Fecal occult blood test (FOBT) of the stool. You may have this test every year starting at age 33.  Flexible sigmoidoscopy or colonoscopy. You may have a sigmoidoscopy every 5 years or a colonoscopy every 10 years starting at age 48.  Prostate cancer screening. Recommendations will vary depending on your family history and other risks.  Hepatitis C blood test.  Hepatitis B blood test.  Sexually transmitted disease (STD) testing.  Diabetes screening. This is done by checking your blood sugar (glucose) after you have not eaten for a while (fasting). You may have this done every 1-3 years.  Abdominal aortic aneurysm (AAA) screening. You may need this if you are a current or former smoker.  Osteoporosis. You may be screened starting at age 40 if  you are at high risk. Talk with your health care provider about your test results, treatment options, and if necessary, the need for more tests. Vaccines  Your health care provider may recommend certain vaccines, such as:  Influenza vaccine. This is recommended every year.  Tetanus, diphtheria, and acellular  pertussis (Tdap, Td) vaccine. You may need a Td booster every 10 years.  Zoster vaccine. You may need this after age 35.  Pneumococcal 13-valent conjugate (PCV13) vaccine. One dose is recommended after age 52.  Pneumococcal polysaccharide (PPSV23) vaccine. One dose is recommended after age 33. Talk to your health care provider about which screenings and vaccines you need and how often you need them. This information is not intended to replace advice given to you by your health care provider. Make sure you discuss any questions you have with your health care provider. Document Released: 04/27/2015 Document Revised: 12/19/2015 Document Reviewed: 01/30/2015 Elsevier Interactive Patient Education  2017 [place] Prevention in the Home Falls can cause injuries. They can happen to people of all ages. There are many things you can do to make your home safe and to help prevent falls. What can I do on the outside of my home?  Regularly fix the edges of walkways and driveways and fix any cracks.  Remove anything that might make you trip as you walk through a door, such as a raised step or threshold.  Trim any bushes or trees on the path to your home.  Use bright outdoor lighting.  Clear any walking paths of anything that might make someone trip, such as rocks or tools.  Regularly check to see if handrails are loose or broken. Make sure that both sides of any steps have handrails.  Any raised decks and porches should have guardrails on the edges.  Have any leaves, snow, or ice cleared regularly.  Use sand or salt on walking paths during winter.  Clean up any spills in your garage right away. This includes oil or grease spills. What can I do in the bathroom?  Use night lights.  Install grab bars by the toilet and in the tub and shower. Do not use towel bars as grab bars.  Use non-skid mats or decals in the tub or shower.  If you need to sit down in the shower, use a plastic,  non-slip stool.  Keep the floor dry. Clean up any water that spills on the floor as soon as it happens.  Remove soap buildup in the tub or shower regularly.  Attach bath mats securely with double-sided non-slip rug tape.  Do not have throw rugs and other things on the floor that can make you trip. What can I do in the bedroom?  Use night lights.  Make sure that you have a light by your bed that is easy to reach.  Do not use any sheets or blankets that are too big for your bed. They should not hang down onto the floor.  Have a firm chair that has side arms. You can use this for support while you get dressed.  Do not have throw rugs and other things on the floor that can make you trip. What can I do in the kitchen?  Clean up any spills right away.  Avoid walking on wet floors.  Keep items that you use a lot in easy-to-reach places.  If you need to reach something above you, use a strong step stool that has a grab bar.  Keep electrical cords  out of the way.  Do not use floor polish or wax that makes floors slippery. If you must use wax, use non-skid floor wax.  Do not have throw rugs and other things on the floor that can make you trip. What can I do with my stairs?  Do not leave any items on the stairs.  Make sure that there are handrails on both sides of the stairs and use them. Fix handrails that are broken or loose. Make sure that handrails are as long as the stairways.  Check any carpeting to make sure that it is firmly attached to the stairs. Fix any carpet that is loose or worn.  Avoid having throw rugs at the top or bottom of the stairs. If you do have throw rugs, attach them to the floor with carpet tape.  Make sure that you have a light switch at the top of the stairs and the bottom of the stairs. If you do not have them, ask someone to add them for you. What else can I do to help prevent falls?  Wear shoes that:  Do not have high heels.  Have rubber  bottoms.  Are comfortable and fit you well.  Are closed at the toe. Do not wear sandals.  If you use a stepladder:  Make sure that it is fully opened. Do not climb a closed stepladder.  Make sure that both sides of the stepladder are locked into place.  Ask someone to hold it for you, if possible.  Clearly mark and make sure that you can see:  Any grab bars or handrails.  First and last steps.  Where the edge of each step is.  Use tools that help you move around (mobility aids) if they are needed. These include:  Canes.  Walkers.  Scooters.  Crutches.  Turn on the lights when you go into a dark area. Replace any light bulbs as soon as they burn out.  Set up your furniture so you have a clear path. Avoid moving your furniture around.  If any of your floors are uneven, fix them.  If there are any pets around you, be aware of where they are.  Review your medicines with your doctor. Some medicines can make you feel dizzy. This can increase your chance of falling. Ask your doctor what other things that you can do to help prevent falls. This information is not intended to replace advice given to you by your health care provider. Make sure you discuss any questions you have with your health care provider. Document Released: 01/25/2009 Document Revised: 09/06/2015 Document Reviewed: 05/05/2014 Elsevier Interactive Patient Education  2017 [employer].

## 2020-09-12 ENCOUNTER — Ambulatory Visit (INDEPENDENT_AMBULATORY_CARE_PROVIDER_SITE_OTHER): Payer: Medicare Other | Admitting: Orthopedic Surgery

## 2020-09-12 ENCOUNTER — Ambulatory Visit (INDEPENDENT_AMBULATORY_CARE_PROVIDER_SITE_OTHER): Payer: Medicare Other

## 2020-09-12 DIAGNOSIS — M79672 Pain in left foot: Secondary | ICD-10-CM | POA: Diagnosis not present

## 2020-09-14 ENCOUNTER — Encounter: Payer: Self-pay | Admitting: Orthopedic Surgery

## 2020-09-14 NOTE — Progress Notes (Signed)
Office Visit Note   Patient: Fernando Riley           Date of Birth: 03/12/1951           MRN: [id number] Visit Date: 09/12/2020 Requested by: Erven Colla, DO [place],   [id number] PCP: Erven Colla, DO  Subjective: Chief Complaint  Patient presents with  . Left Foot - Pain    HPI: Fernando Riley is a 70 year old patient with left foot pain for 2 months atraumatic onset.  It has been worse over the past 2 weeks.  He has tried Celebrex prednisone and ibuprofen.  Reports primarily plantar pain on the medial aspect of the heel.  When he walks he has been rolling it a little bit to the outside to see if that helps.  Similar problem 20 years ago on the right foot was treated successfully with arch supports.  Symptoms are worse in the morning but gets better as he "walks and works without".  He does have new shoes which have fairly reasonable arches.  He has tried gel insert for the right foot as well as some heel pads for the left foot without too much relief.  He does a lot of outside projects.  Currently he is wearing flip-flops but he does have better shoes which he wears sometimes.              ROS: All systems reviewed are negative as they relate to the chief complaint within the history of present illness.  Patient denies  fevers or chills.   Assessment & Plan: Visit Diagnoses:  1. Pain in left foot     Plan: Impression is slightly atypical plantar fasciitis affecting primarily the medial side of the plantar heel.  Not too much pain or tenderness to palpation around the Achilles insertion on the posterior aspect of the calcaneus.  Calcification is present on radiographs in this region but it is asymptomatic.  Heel cord is mildly tight bilaterally.  Plan at this time is heel cord stretching along with gel inserts to be worn in shoes.  He needs to really avoid trying to walk around in flip-flops as well as barefooted.  Could consider ultrasound-guided cortisone injection  but hesitate to do that unless absolutely necessary because he does have 2 total knees present.  We will see how he is doing in 4 to 6 weeks.  Lauren can let me know if he requires further intervention.  I do think though changing his shoe wear and using arch supports would be helpful because he has a moderately high arch.  Follow-Up Instructions: No follow-ups on file.   Orders:  Orders Placed This Encounter  Procedures  . XR Foot Complete Left   No orders of the defined types were placed in this encounter.     Procedures: No procedures performed   Clinical Data: No additional findings.  Objective: Vital Signs: There were no vitals taken for this visit.  Physical Exam:   Constitutional: Patient appears well-developed HEENT:  Head: Normocephalic Eyes:EOM are normal Neck: Normal range of motion Cardiovascular: Normal rate Pulmonary/chest: Effort normal Neurologic: Patient is alert Skin: Skin is warm Psychiatric: Patient has normal mood and affect    Ortho Exam: Ortho exam demonstrates some tenderness to palpation along the medial aspect of the plantar heel on the left.  Ankle dorsiflexion passively with the knee flexed is about 15 degrees bilaterally.  No tenderness to palpation anywhere along the course of the Achilles tendon.  Subtalar tibiotalar transverse tarsal range of motion intact.  No tenderness to palpation of the Achilles posterior tib anterior tib or peroneal tendons.  Squeeze test equivocal on the left negative on the right.  No focal swelling.  Most of his maximal tenderness is right at the junction of the vertical and horizontal aspect of the heel on the medial side.  Specialty Comments:  No specialty comments available.  Imaging: No results found.   PMFS History: Patient Active Problem List   Diagnosis Date Noted  . Nephrolithiasis 01/09/2020  . S/P left TKA 03/22/2019  . Obesity (BMI 35.0-39.9 without comorbidity) 01/26/2019  . Status post total  right knee replacement 01/25/2019  . Peyronie's disease 09/14/2017  . Obstructive sleep apnea 08/20/2015  . Essential hypertension, benign 07/20/2013   Past Medical History:  Diagnosis Date  . Arthritis   . Cat allergies   . Fatty liver   . History of kidney stones   . History of migraine   . Hypertension   . Impaired fasting glucose   . Kidney stone   . Melanoma ([facility])    lower legs bilaterally  . OSA (obstructive sleep apnea)    unable to sleep with use of CPAP  . Peyronie disease   . Pollen allergies   . 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 Venous stasis     Family History  Problem Relation Age of Onset  . Heart disease Father   . Diabetes Brother   . Hyperlipidemia Brother     Past Surgical History:  Procedure Laterality Date  . COLONOSCOPY N/A 01/21/2016   Procedure: COLONOSCOPY;  Surgeon: Daneil Dolin, MD;  [facility];  Service: Endoscopy;  Laterality: N/A;  1:00 PM - moved to 12:30 - office notified per Tretha Sciara  . EXTRACORPOREAL SHOCK WAVE LITHOTRIPSY Left 01/17/2020   Procedure: EXTRACORPOREAL SHOCK WAVE LITHOTRIPSY (ESWL);  Surgeon: Cleon Gustin, MD;  Location: [facility];  Service: Urology;  Laterality: Left;  . EXTRACORPOREAL SHOCK WAVE LITHOTRIPSY Right 02/14/2020   Procedure: EXTRACORPOREAL SHOCK WAVE LITHOTRIPSY (ESWL);  Surgeon: Cleon Gustin, MD;  Location: [facility];  Service: Urology;  Laterality: Right;  . KNEE ARTHROSCOPY Bilateral   . POLYPECTOMY  01/21/2016   Procedure: POLYPECTOMY;  Surgeon: Daneil Dolin, MD;  [facility];  Service: Endoscopy;;  colon  . SHOULDER ARTHROSCOPY Left   . SHOULDER SURGERY Right 2016  . TOOTH EXTRACTION    . TOTAL KNEE ARTHROPLASTY Right 01/25/2019   Procedure: TOTAL KNEE ARTHROPLASTY;  Surgeon: Paralee Cancel, MD;  Location: [facility];  Service: Orthopedics;  Laterality: Right;  70 mins  . TOTAL KNEE ARTHROPLASTY Left 03/22/2019   Procedure: TOTAL KNEE ARTHROPLASTY;  Surgeon: Paralee Cancel, MD;  Location: [facility];  Service: Orthopedics;  Laterality: Left;  70 mins   Social History   Occupational History  . Not on file  Tobacco Use  . Smoking status: Never Smoker  . Smokeless tobacco: Never Used  Vaping Use  . Vaping Use: Never used  Substance and Sexual Activity  . Alcohol use: No  . Drug use: No  . Sexual activity: Not on file

## 2020-09-26 ENCOUNTER — Other Ambulatory Visit: Payer: Self-pay | Admitting: Urology

## 2020-09-26 DIAGNOSIS — N200 Calculus of kidney: Secondary | ICD-10-CM

## 2021-01-01 ENCOUNTER — Other Ambulatory Visit: Payer: Self-pay

## 2021-01-01 ENCOUNTER — Ambulatory Visit (HOSPITAL_COMMUNITY)
Admission: RE | Admit: 2021-01-01 | Discharge: 2021-01-01 | Disposition: A | Discharge status: Home / Self Care | Payer: Medicare Other | Source: Ambulatory Visit | Attending: Urology | Admitting: Urology

## 2021-01-01 DIAGNOSIS — N200 Calculus of kidney: Secondary | ICD-10-CM | POA: Insufficient documentation

## 2021-01-02 ENCOUNTER — Ambulatory Visit (INDEPENDENT_AMBULATORY_CARE_PROVIDER_SITE_OTHER): Payer: Medicare Other | Admitting: Urology

## 2021-01-02 ENCOUNTER — Encounter: Payer: Self-pay | Admitting: Urology

## 2021-01-02 VITALS — BP 145/85 | HR 64 | Ht 72.0 in | Wt 265.0 lb

## 2021-01-02 DIAGNOSIS — N200 Calculus of kidney: Secondary | ICD-10-CM

## 2021-01-02 LAB — URINALYSIS, ROUTINE W REFLEX MICROSCOPIC
Bilirubin, UA: NEGATIVE
Glucose, UA: NEGATIVE
Ketones, UA: NEGATIVE
Leukocytes,UA: NEGATIVE
Nitrite, UA: NEGATIVE
Protein,UA: NEGATIVE
RBC, UA: NEGATIVE
Specific Gravity, UA: 1.025 (ref 1.005–1.030)
Urobilinogen, Ur: 0.2 mg/dL (ref 0.2–1.0)
pH, UA: 5.5 (ref 5.0–7.5)

## 2021-01-02 NOTE — Progress Notes (Signed)
01/02/2021 9:39 AM   Valentino Hue 09/27/1950 [id number]  Referring provider: Erven Colla, DO [place],  [place] [id number]  nephrolithiasis   HPI: Mr Bellamy is a 70yo here for followup for nephrolithiasis. KUB from yesterday shows multiple clustered 102mm right upper pole calculi. He has intermittent right flank pain. No significant LUTS. 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[facility])    Venous stasis     Surgical History: Past Surgical History:  Procedure Laterality Date   COLONOSCOPY N/A 01/21/2016   Procedure: COLONOSCOPY;  Surgeon: Daneil Dolin, MD;  [facility];  Service: Endoscopy;  Laterality: N/A;  1:00 PM - moved to 12:30 - office notified per [place] LITHOTRIPSY Left 01/17/2020   Procedure: EXTRACORPOREAL SHOCK WAVE LITHOTRIPSY (ESWL);  Surgeon: Cleon Gustin, MD;  Location[facility];  Service: Urology;  Laterality: Left;   EXTRACORPOREAL SHOCK WAVE LITHOTRIPSY Right 02/14/2020   Procedure: EXTRACORPOREAL SHOCK WAVE LITHOTRIPSY (ESWL);  Surgeon: Cleon Gustin, MD;  Location[facility];  Service: Urology;  Laterality: Right;   KNEE ARTHROSCOPY Bilateral    POLYPECTOMY  01/21/2016   Procedure: POLYPECTOMY;  Surgeon: Daneil Dolin, MD;  [facility];  Service: Endoscopy;;  colon   SHOULDER ARTHROSCOPY Left    SHOULDER SURGERY Right 2016   TOOTH EXTRACTION     TOTAL KNEE ARTHROPLASTY Right 01/25/2019   Procedure: TOTAL KNEE ARTHROPLASTY;  Surgeon: Paralee Cancel, MD;  [facility];  Service: Orthopedics;  Laterality:  Right;  70 mins   TOTAL KNEE ARTHROPLASTY Left 03/22/2019   Procedure: TOTAL KNEE ARTHROPLASTY;  Surgeon: Paralee Cancel, MD;  [facility];  Service: Orthopedics;  Laterality: Left;  70 mins    Home Medications:  Allergies as of 01/02/2021       Reactions   Other    Cats   Red Dye Hives   Pt's wife reported he gets hives with "food" red dye, but can take tablets that are red or pink.         Medication List        Accurate as of January 02, 2021  9:39 AM. If you have any questions, ask your nurse or doctor.          acetaminophen 500 MG tablet Commonly known as: TYLENOL Take 500 mg by mouth daily.   aspirin EC 81 MG tablet Take 81 mg by mouth daily.   enalapril 10 MG tablet Commonly known as: VASOTEC Take 1 tablet (10 mg total) by mouth daily.   ondansetron 4 MG tablet Commonly known as: Zofran Take 1 tablet (4 mg total) by mouth daily as needed for nausea or vomiting.   tamsulosin 0.4 MG Caps capsule Commonly known as: FLOMAX TAKE 1 CAPSULE EVERY DAY   Turmeric 500 MG Caps Take 1,000 mg by mouth daily.        Allergies:  Allergies  Allergen Reactions   Other  Cats   Red Dye Hives    Pt's wife reported he gets hives with "food" red dye, but can take tablets that are red or pink.     Family History: Family History  Problem Relation Age of Onset   Heart disease Father    Diabetes Brother    Hyperlipidemia Brother     Social History:  reports that he has never smoked. He has never used smokeless tobacco. He reports that he does not drink alcohol and does not use drugs.  ROS: All other review of systems were reviewed and are negative except what is noted above in HPI  Physical Exam: BP (!) 145/85   Pulse 64   Ht 6' (1.829 m)   Wt 265 lb (120.2 kg)   BMI 35.94 kg/m   Constitutional:  Alert and oriented, No acute distress. HEENT: [place] AT, moist mucus membranes.  Trachea midline, no masses. Cardiovascular: No clubbing, cyanosis, or  edema. Respiratory: Normal respiratory effort, no increased work of breathing. GI: Abdomen is soft, nontender, nondistended, no abdominal masses GU: No CVA tenderness.  Lymph: No cervical or inguinal lymphadenopathy. Skin: No rashes, bruises or suspicious lesions. Neurologic: Grossly intact, no focal deficits, moving all 4 extremities. Psychiatric: Normal mood and affect.  Laboratory Data: Lab Results  Component Value Date   WBC 6.3 06/08/2020   HGB 14.6 06/08/2020   HCT 43.1 06/08/2020   MCV 92 06/08/2020   PLT 241 06/08/2020    Lab Results  Component Value Date   CREATININE 1.00 06/08/2020    Lab Results  Component Value Date   PSA 0.2 07/17/2014   PSA 0.31 07/11/2013    No results found for: TESTOSTERONE  No results found for: HGBA1C  Urinalysis    Component Value Date/Time   APPEARANCEUR Clear 06/27/2020 0835   GLUCOSEU Negative 06/27/2020 0835   BILIRUBINUR Negative 06/27/2020 0835   PROTEINUR Negative 06/27/2020 0835   UROBILINOGEN negative (A) 06/14/2019 1549   NITRITE Negative 06/27/2020 0835   LEUKOCYTESUR Negative 06/27/2020 0835    Lab Results  Component Value Date   LABMICR Comment 06/27/2020   WBCUA None seen 01/09/2020   LABEPIT 0-10 01/09/2020   MUCUS Present 01/09/2020   BACTERIA Many (A) 01/09/2020    Pertinent Imaging: KUb yesterday: Images reviewed and discussed with the patient Results for orders placed during the hospital encounter of 06/27/20  Abdomen 1 view (KUB)  Narrative CLINICAL DATA:  Nephrolithiasis  EXAM: ABDOMEN - 1 VIEW  COMPARISON:  02/28/2020  FINDINGS: Several small right renal calculi are again identified. Burden appears unchanged. Few possible punctate left renal calculi also present on the prior study. Bowel gas pattern is unremarkable.  IMPRESSION: Unchanged few small right renal calculi. Few possible punctate left renal calculi also appear unchanged.   Electronically Signed By: Macy Mis  M.D. On: 06/27/2020 14:53  No results found for this or any previous visit.  No results found for this or any previous visit.  No results found for this or any previous visit.  No results found for this or any previous visit.  No results found for this or any previous visit.  No results found for this or any previous visit.  Results for orders placed during the hospital encounter of 01/09/20  CT RENAL STONE STUDY  Narrative CLINICAL DATA:  Left-sided flank pain for several days  EXAM: CT ABDOMEN AND PELVIS WITHOUT CONTRAST  TECHNIQUE: Multidetector CT imaging of the abdomen and pelvis was performed following the standard protocol without  IV contrast.  COMPARISON:  12/09/2007  FINDINGS: Lower chest: No acute abnormality.  Hepatobiliary: No focal liver abnormality is seen. No gallstones, gallbladder wall thickening, or biliary dilatation.  Pancreas: Unremarkable. No pancreatic ductal dilatation or surrounding inflammatory changes.  Spleen: Normal in size without focal abnormality.  Adrenals/Urinary Tract: Adrenal glands are within normal limits. Kidneys are well visualized bilaterally with bilateral renal calculi increased in size and number when compared with the prior exam. The largest of these on the right measures 4 mm in the upper pole. Largest of these on the left measures 6 mm in the midportion of the left kidney. Mild fullness of the collecting system and left ureter is seen. Some Peri ureteral stranding is noted. Two distal left ureteral stones are noted. The dominant stone measures approximately 6 mm. The smaller adjacent stone measures 1-2 mm. This is best visualized on image number 81 of series 6. The bladder is decompressed.  Stomach/Bowel: The appendix is within normal limits. Colon shows no obstructive or inflammatory changes. Small bowel and stomach appear within normal limits as well.  Vascular/Lymphatic: Aortic atherosclerosis. No enlarged  abdominal or pelvic lymph nodes.  Reproductive: Prostate is unremarkable.  Other: No abdominal wall hernia or abnormality. No abdominopelvic ascites.  Musculoskeletal: No acute or significant osseous findings.  IMPRESSION: Two distal left ureteral stones as described above causing mild hydronephrosis and hydroureter.  Bilateral nonobstructing renal stones as described.   Electronically Signed By: Inez Catalina M.D. On: 01/09/2020 09:44   Assessment & Plan:    1. Nephrolithiasis --We discussed the management of kidney stones. These options include observation, ureteroscopy, shockwave lithotripsy (ESWL) and percutaneous nephrolithotomy (PCNL). We discussed which options are relevant to the patient's stone(s). We discussed the natural history of kidney stones as well as the complications of untreated stones and the impact on quality of life without treatment as well as with each of the above listed treatments. We also discussed the efficacy of each treatment in its ability to clear the stone burden. With any of these management options I discussed the signs and symptoms of infection and the need for emergent treatment should these be experienced. For each option we discussed the ability of each procedure to clear the patient of their stone burden.   For observation I described the risks which include but are not limited to silent renal damage, life-threatening infection, need for emergent surgery, failure to pass stone and pain.   For ureteroscopy I described the risks which include bleeding, infection, damage to contiguous structures, positioning injury, ureteral stricture, ureteral avulsion, ureteral injury, need for prolonged ureteral stent, inability to perform ureteroscopy, need for an interval procedure, inability to clear stone burden, stent discomfort/pain, heart attack, stroke, pulmonary embolus and the inherent risks with general anesthesia.   For shockwave lithotripsy I  described the risks which include arrhythmia, kidney contusion, kidney hemorrhage, need for transfusion, pain, inability to adequately break up stone, inability to pass stone fragments, Steinstrasse, infection associated with obstructing stones, need for alternate surgical procedure, need for repeat shockwave lithotripsy, MI, CVA, PE and the inherent risks with anesthesia/conscious sedation.   For PCNL I described the risks including positioning injury, pneumothorax, hydrothorax, need for chest tube, inability to clear stone burden, renal laceration, arterial venous fistula or malformation, need for embolization of kidney, loss of kidney or renal function, need for repeat procedure, need for prolonged nephrostomy tube, ureteral avulsion, MI, CVA, PE and the inherent risks of general anesthesia.   - The patient would like  to proceed with Right ESWL.  - Urinalysis, Routine w reflex microscopic   No follow-ups on file.  Nicolette Bang, MD  [facility] [place]

## 2021-01-02 NOTE — Progress Notes (Signed)
Urological Symptom Review  Patient is experiencing the following symptoms: Get up at night to urinate Kidney stones   Review of Systems  Gastrointestinal (upper)  : Negative for upper GI symptoms  Gastrointestinal (lower) : Negative for lower GI symptoms  Constitutional : Negative for symptoms  Skin: Itching  Eyes: Negative for eye symptoms  Ear/Nose/Throat : Negative for Ear/Nose/Throat symptoms  Hematologic/Lymphatic: Easy bruising  Cardiovascular : Negative for cardiovascular symptoms  Respiratory : Negative for respiratory symptoms  Endocrine: Negative for endocrine symptoms  Musculoskeletal: Negative for musculoskeletal symptoms  Neurological: Negative for neurological symptoms  Psychologic: Negative for psychiatric symptoms

## 2021-01-02 NOTE — H&P (View-Only) (Signed)
01/02/2021 9:39 AM   Fernando Riley Sep 25, 1950 [id number]  Referring provider: Erven Colla, DO [place],  [place] [id number]  nephrolithiasis   HPI: [name] is a 70yo here for followup for nephrolithiasis. KUB from yesterday shows multiple clustered 54mm right upper pole calculi. He has intermittent right flank pain. No significant LUTS. No hematuria or dysuria   PMH: Past Medical History:  Diagnosis Date   Arthritis    Cat allergies    Fatty liver    History of kidney stones    History of migraine    Hypertension    Impaired fasting glucose    Kidney stone    Melanoma ([place])    lower legs bilaterally   OSA (obstructive sleep apnea)    unable to sleep with use of CPAP   Peyronie disease    Pollen allergies    Reactive airways dysfunction syndrome (HCC)    Venous stasis     Surgical History: Past Surgical History:  Procedure Laterality Date   COLONOSCOPY N/A 01/21/2016   Procedure: COLONOSCOPY;  Surgeon: Daneil Dolin, MD;  [facility];  Service: Endoscopy;  Laterality: N/A;  1:00 PM - moved to 12:30 - office notified per [place] LITHOTRIPSY Left 01/17/2020   Procedure: EXTRACORPOREAL SHOCK WAVE LITHOTRIPSY (ESWL);  Surgeon: Cleon Gustin, MD;  Location: [facility];  Service: Urology;  Laterality: Left;   EXTRACORPOREAL SHOCK WAVE LITHOTRIPSY Right 02/14/2020   Procedure: EXTRACORPOREAL SHOCK WAVE LITHOTRIPSY (ESWL);  Surgeon: Cleon Gustin, MD;  Location: [facility];  Service: Urology;  Laterality: Right;   KNEE ARTHROSCOPY Bilateral    POLYPECTOMY  01/21/2016   Procedure: POLYPECTOMY;  Surgeon: Daneil Dolin, MD;  Location[facility];  Service: Endoscopy;;  colon   SHOULDER ARTHROSCOPY Left    SHOULDER SURGERY Right 2016   TOOTH EXTRACTION     TOTAL KNEE ARTHROPLASTY Right 01/25/2019   Procedure: TOTAL KNEE ARTHROPLASTY;  Surgeon: Paralee Cancel, MD;  Location: [facility];  Service: Orthopedics;  Laterality:  Right;  70 mins   TOTAL KNEE ARTHROPLASTY Left 03/22/2019   Procedure: TOTAL KNEE ARTHROPLASTY;  Surgeon: Paralee Cancel, MD;  Location: [facility];  Service: Orthopedics;  Laterality: Left;  70 mins    Home Medications:  Allergies as of 01/02/2021       Reactions   Other    Cats   Red Dye Hives   Pt's wife reported he gets hives with "food" red dye, but can take tablets that are red or pink.         Medication List        Accurate as of January 02, 2021  9:39 AM. If you have any questions, ask your nurse or doctor.          acetaminophen 500 MG tablet Commonly known as: TYLENOL Take 500 mg by mouth daily.   aspirin EC 81 MG tablet Take 81 mg by mouth daily.   enalapril 10 MG tablet Commonly known as: VASOTEC Take 1 tablet (10 mg total) by mouth daily.   ondansetron 4 MG tablet Commonly known as: Zofran Take 1 tablet (4 mg total) by mouth daily as needed for nausea or vomiting.   tamsulosin 0.4 MG Caps capsule Commonly known as: FLOMAX TAKE 1 CAPSULE EVERY DAY   Turmeric 500 MG Caps Take 1,000 mg by mouth daily.        Allergies:  Allergies  Allergen Reactions   Other  Cats   Red Dye Hives    Pt's wife reported he gets hives with "food" red dye, but can take tablets that are red or pink.     Family History: Family History  Problem Relation Age of Onset   Heart disease Father    Diabetes Brother    Hyperlipidemia Brother     Social History:  reports that he has never smoked. He has never used smokeless tobacco. He reports that he does not drink alcohol and does not use drugs.  ROS: All other review of systems were reviewed and are negative except what is noted above in HPI  Physical Exam: BP (!) 145/85   Pulse 64   Ht 6' (1.829 m)   Wt 265 lb (120.2 kg)   BMI 35.94 kg/m   Constitutional:  Alert and oriented, No acute distress. HEENT: [place] AT, moist mucus membranes.  Trachea midline, no masses. Cardiovascular: No clubbing, cyanosis, or  edema. Respiratory: Normal respiratory effort, no increased work of breathing. GI: Abdomen is soft, nontender, nondistended, no abdominal masses GU: No CVA tenderness.  Lymph: No cervical or inguinal lymphadenopathy. Skin: No rashes, bruises or suspicious lesions. Neurologic: Grossly intact, no focal deficits, moving all 4 extremities. Psychiatric: Normal mood and affect.  Laboratory Data: Lab Results  Component Value Date   WBC 6.3 06/08/2020   HGB 14.6 06/08/2020   HCT 43.1 06/08/2020   MCV 92 06/08/2020   PLT 241 06/08/2020    Lab Results  Component Value Date   CREATININE 1.00 06/08/2020    Lab Results  Component Value Date   PSA 0.2 07/17/2014   PSA 0.31 07/11/2013    No results found for: TESTOSTERONE  No results found for: HGBA1C  Urinalysis    Component Value Date/Time   APPEARANCEUR Clear 06/27/2020 0835   GLUCOSEU Negative 06/27/2020 0835   BILIRUBINUR Negative 06/27/2020 0835   PROTEINUR Negative 06/27/2020 0835   UROBILINOGEN negative (A) 06/14/2019 1549   NITRITE Negative 06/27/2020 0835   LEUKOCYTESUR Negative 06/27/2020 0835    Lab Results  Component Value Date   LABMICR Comment 06/27/2020   WBCUA None seen 01/09/2020   LABEPIT 0-10 01/09/2020   MUCUS Present 01/09/2020   BACTERIA Many (A) 01/09/2020    Pertinent Imaging: KUb yesterday: Images reviewed and discussed with the patient Results for orders placed during the hospital encounter of 06/27/20  Abdomen 1 view (KUB)  Narrative CLINICAL DATA:  Nephrolithiasis  EXAM: ABDOMEN - 1 VIEW  COMPARISON:  02/28/2020  FINDINGS: Several small right renal calculi are again identified. Burden appears unchanged. Few possible punctate left renal calculi also present on the prior study. Bowel gas pattern is unremarkable.  IMPRESSION: Unchanged few small right renal calculi. Few possible punctate left renal calculi also appear unchanged.   Electronically Signed By: Macy Mis  M.D. On: 06/27/2020 14:53  No results found for this or any previous visit.  No results found for this or any previous visit.  No results found for this or any previous visit.  No results found for this or any previous visit.  No results found for this or any previous visit.  No results found for this or any previous visit.  Results for orders placed during the hospital encounter of 01/09/20  CT RENAL STONE STUDY  Narrative CLINICAL DATA:  Left-sided flank pain for several days  EXAM: CT ABDOMEN AND PELVIS WITHOUT CONTRAST  TECHNIQUE: Multidetector CT imaging of the abdomen and pelvis was performed following the standard protocol without  IV contrast.  COMPARISON:  12/09/2007  FINDINGS: Lower chest: No acute abnormality.  Hepatobiliary: No focal liver abnormality is seen. No gallstones, gallbladder wall thickening, or biliary dilatation.  Pancreas: Unremarkable. No pancreatic ductal dilatation or surrounding inflammatory changes.  Spleen: Normal in size without focal abnormality.  Adrenals/Urinary Tract: Adrenal glands are within normal limits. Kidneys are well visualized bilaterally with bilateral renal calculi increased in size and number when compared with the prior exam. The largest of these on the right measures 4 mm in the upper pole. Largest of these on the left measures 6 mm in the midportion of the left kidney. Mild fullness of the collecting system and left ureter is seen. Some Peri ureteral stranding is noted. Two distal left ureteral stones are noted. The dominant stone measures approximately 6 mm. The smaller adjacent stone measures 1-2 mm. This is best visualized on image number 81 of series 6. The bladder is decompressed.  Stomach/Bowel: The appendix is within normal limits. Colon shows no obstructive or inflammatory changes. Small bowel and stomach appear within normal limits as well.  Vascular/Lymphatic: Aortic atherosclerosis. No enlarged  abdominal or pelvic lymph nodes.  Reproductive: Prostate is unremarkable.  Other: No abdominal wall hernia or abnormality. No abdominopelvic ascites.  Musculoskeletal: No acute or significant osseous findings.  IMPRESSION: Two distal left ureteral stones as described above causing mild hydronephrosis and hydroureter.  Bilateral nonobstructing renal stones as described.   Electronically Signed By: Inez Catalina M.D. On: 01/09/2020 09:44   Assessment & Plan:    1. Nephrolithiasis --We discussed the management of kidney stones. These options include observation, ureteroscopy, shockwave lithotripsy (ESWL) and percutaneous nephrolithotomy (PCNL). We discussed which options are relevant to the patient's stone(s). We discussed the natural history of kidney stones as well as the complications of untreated stones and the impact on quality of life without treatment as well as with each of the above listed treatments. We also discussed the efficacy of each treatment in its ability to clear the stone burden. With any of these management options I discussed the signs and symptoms of infection and the need for emergent treatment should these be experienced. For each option we discussed the ability of each procedure to clear the patient of their stone burden.   For observation I described the risks which include but are not limited to silent renal damage, life-threatening infection, need for emergent surgery, failure to pass stone and pain.   For ureteroscopy I described the risks which include bleeding, infection, damage to contiguous structures, positioning injury, ureteral stricture, ureteral avulsion, ureteral injury, need for prolonged ureteral stent, inability to perform ureteroscopy, need for an interval procedure, inability to clear stone burden, stent discomfort/pain, heart attack, stroke, pulmonary embolus and the inherent risks with general anesthesia.   For shockwave lithotripsy I  described the risks which include arrhythmia, kidney contusion, kidney hemorrhage, need for transfusion, pain, inability to adequately break up stone, inability to pass stone fragments, Steinstrasse, infection associated with obstructing stones, need for alternate surgical procedure, need for repeat shockwave lithotripsy, MI, CVA, PE and the inherent risks with anesthesia/conscious sedation.   For PCNL I described the risks including positioning injury, pneumothorax, hydrothorax, need for chest tube, inability to clear stone burden, renal laceration, arterial venous fistula or malformation, need for embolization of kidney, loss of kidney or renal function, need for repeat procedure, need for prolonged nephrostomy tube, ureteral avulsion, MI, CVA, PE and the inherent risks of general anesthesia.   - The patient would like  to proceed with Right ESWL.  - Urinalysis, Routine w reflex microscopic   No follow-ups on file.  Nicolette Bang, MD  [facility] [place]

## 2021-01-02 NOTE — Patient Instructions (Signed)
Lithotripsy Lithotripsy is a treatment that can help break up kidney stones that are too large to pass on their own. This is a nonsurgical procedure that crushes a kidney stone with shock waves. These shock waves pass through your body and focus on the kidney stone. They cause the kidney stone to break up into smaller pieces while it is still in the urinary tract. The smaller pieces of stone can pass more easily out of your body in the urine. Tell a health care provider about: Any allergies you have. All medicines you are taking, including vitamins, herbs, eye drops, creams, and over-the-counter medicines. Any problems you or family members have had with anesthetic medicines. Any blood disorders you have. Any surgeries you have had. Any medical conditions you have. Whether you are pregnant or may be pregnant. What are the risks? Generally, this is a safe procedure. However, problems may occur, including: Infection. Bleeding from the kidney. Bruising of the kidney or skin. Scarring of the kidney, which can lead to: Increased blood pressure. Poor kidney function. Return (recurrence) of kidney stones. Damage to other structures or organs, such as the liver, colon, spleen, or pancreas. Blockage (obstruction) of the tube that carries urine from the kidney to the bladder (ureter). Failure of the kidney stone to break into pieces (fragments). What happens before the procedure? Staying hydrated Follow instructions from your health care provider about hydration, which may include: Up to 2 hours before the procedure - you may continue to drink clear liquids, such as water, clear fruit juice, black coffee, and plain tea. Eating and drinking restrictions Follow instructions from your health care provider about eating and drinking, which may include: 8 hours before the procedure - stop eating heavy meals or foods, such as meat, fried foods, or fatty foods. 6 hours before the procedure - stop eating  light meals or foods, such as toast or cereal. 6 hours before the procedure - stop drinking milk or drinks that contain milk. 2 hours before the procedure - stop drinking clear liquids. Medicines Ask your health care provider about: Changing or stopping your regular medicines. This is especially important if you are taking diabetes medicines or blood thinners. Taking medicines such as aspirin and ibuprofen. These medicines can thin your blood. Do not take these medicines unless your health care provider tells you to take them. Taking over-the-counter medicines, vitamins, herbs, and supplements. Tests You may have tests, such as: Blood tests. Urine tests. Imaging tests, such as a CT scan. General instructions Plan to have someone take you home from the hospital or clinic. If you will be going home right after the procedure, plan to have someone with you for 24 hours. Ask your health care provider what steps will be taken to help prevent infection. These may include washing skin with a germ-killing soap. What happens during the procedure?  An IV will be inserted into one of your veins. You will be given one or more of the following: A medicine to help you relax (sedative). A medicine to make you fall asleep (general anesthetic). A water-filled cushion may be placed behind your kidney or on your abdomen. In some cases, you may be placed in a tub of lukewarm water. Your body will be positioned in a way that makes it easy to target the kidney stone. An X-ray or ultrasound exam will be done to locate your stone. Shock waves will be aimed at the stone. If you are awake, you may feel a tapping sensation as  the shock waves pass through your body. A flexible tube with holes in it (stent) may be placed in the ureter. This will help keep urine flowing from the kidney if the fragments of the stone have been blocking the ureter. The procedure may vary among health care providers and hospitals. What  happens after the procedure? You may have an X-ray to see whether the procedure was able to break up the kidney stone and how much of the stone has passed. If large stone fragments remain after treatment, you may need to have a second procedure at a later time. Your blood pressure, heart rate, breathing rate, and blood oxygen level will be monitored until you leave the hospital or clinic. You may be given antibiotics or pain medicine as needed. If a stent was placed in your ureter during surgery, it may stay in place for a few weeks. You may need to strain your urine to collect pieces of the kidney stone for testing. You will need to drink plenty of water. If you were given a sedative during the procedure, it can affect you for several hours. Do not drive or operate machinery until your health care provider says that it is safe. Summary Lithotripsy is a treatment that can help break up kidney stones that are too large to pass on their own. Lithotripsy is a nonsurgical procedure that crushes a kidney stone with shock waves. Generally, this is a safe procedure. However, problems may occur, including damage to the kidney or other organs, infection, or obstruction of the tube that carries urine from the kidney to the bladder (ureter). You may have a stent placed in your ureter to help drain your urine. This stent may stay in place for a few weeks. After the procedure, you will need to drink plenty of water. You may be asked to strain your urine to collect pieces of the kidney stone for testing. This information is not intended to replace advice given to you by your health care provider. Make sure you discuss any questions you have with your health care provider. Document Revised: 01/11/2019 Document Reviewed: 01/12/2019 Elsevier Patient Education  [place].

## 2021-01-09 ENCOUNTER — Other Ambulatory Visit: Payer: Self-pay | Admitting: Family Medicine

## 2021-01-09 ENCOUNTER — Encounter (HOSPITAL_COMMUNITY): Payer: Self-pay

## 2021-01-09 ENCOUNTER — Other Ambulatory Visit: Payer: Self-pay

## 2021-01-09 ENCOUNTER — Encounter (HOSPITAL_COMMUNITY)
Admission: RE | Admit: 2021-01-09 | Discharge: 2021-01-09 | Disposition: A | Discharge status: Home / Self Care | Payer: Medicare Other | Source: Ambulatory Visit | Attending: Urology | Admitting: Urology

## 2021-01-09 DIAGNOSIS — I10 Essential (primary) hypertension: Secondary | ICD-10-CM

## 2021-01-09 NOTE — Patient Instructions (Signed)
Fernando Riley  01/09/2021     @PREFPERIOPPHARMACY @   Your procedure is scheduled on 01/15/2021.   Report to [facility] at 6:15 A.M.   Call this number if you have problems the morning of surgery:  [phone]   Remember:  Do not eat or drink after midnight.      Take these medicines the morning of surgery with A SIP OF WATER   Zofran, Flomax    Do not wear jewelry, make-up or nail polish.  Do not wear lotions, powders, or perfumes, or deodorant.  Do not shave 48 hours prior to surgery.  Men may shave face and neck.  Do not bring valuables to the hospital.  [facility] is not responsible for any belongings or valuables.  Contacts, dentures or bridgework may not be worn into surgery.  Leave your suitcase in the car.  After surgery it may be brought to your room.  For patients admitted to the hospital, discharge time will be determined by your treatment team.  Patients discharged the day of surgery will not be allowed to drive home.   Name and phone number of your driver:   Olin Hauser- wife [phone] Special instructions:    Please read over the following fact sheets that you were given. Care and Recovery After Surgery      Lithotripsy, Care After This sheet gives you information about how to care for yourself after your procedure. Your health care provider may also give you more specific instructions. If you have problems or questions, contact your health care provider. What can I expect after the procedure? After the procedure, it is common to have: Some blood in your urine. This should only last for a few days. Soreness in your back, sides, or upper abdomen for a few days. Blotches or bruises on the area where the shock wave entered the skin. Pain, discomfort, or nausea when pieces (fragments) of the kidney stone move through the tube that carries urine from the kidney to the bladder (ureter). Stone fragments may pass soon after the procedure, but they may continue  to pass for up to 4-8 weeks. If you have severe pain or nausea, contact your health care provider. This may be caused by a large stone that was not broken up, and this may mean that you need more treatment. Some pain or discomfort during urination. Some pain or discomfort in the lower abdomen or (in men) at the base of the penis. Follow these instructions at home: Medicines Take over-the-counter and prescription medicines only as told by your health care provider. If you were prescribed an antibiotic medicine, take it as told by your health care provider. Do not stop taking the antibiotic even if you start to feel better. Ask your health care provider if the medicine prescribed to you requires you to avoid driving or using machinery. Eating and drinking   Drink enough fluid to keep your urine pale yellow. This helps any remaining pieces of the stone to pass. It can also help prevent new stones from forming. Eat plenty of fresh fruits and vegetables. Follow instructions from your health care provider about eating or drinking restrictions. You may be instructed to: Reduce how much salt (sodium) you eat or drink. Check ingredients and nutrition facts on packaged foods and beverages to see how much sodium they contain. Reduce how much meat you eat. Eat the recommended amount of calcium for your age and gender. Ask your health care provider how much calcium you  should have. General instructions Get plenty of rest. Return to your normal activities as told by your health care provider. Ask your health care provider what activities are safe for you. Most people can resume normal activities 1-2 days after the procedure. If you were given a sedative during the procedure, it can affect you for several hours. Do not drive or operate machinery until your health care provider says that it is safe. Your health care provider may direct you to lie in a certain position (postural drainage) and tap firmly  (percuss) over your kidney area to help stone fragments pass. Follow instructions as told by your health care provider. If directed, strain all urine through the strainer that was provided by your health care provider. Keep all fragments for your health care provider to see. Any stones that are found may be sent to a medical lab for examination. The stone may be as small as a grain of salt. Keep all follow-up visits as told by your health care provider. This is important. Contact a health care provider if: You have a fever or chills. You have nausea that is severe or does not go away. You have any of these urinary symptoms: Blood in your urine for longer than your health care provider told you to expect. Urine that smells bad or unusual. Feeling a strong urge to urinate after emptying your bladder. Pain or burning with urination that does not go away. Urinating more often than usual and this does not go away. You have a stent and it comes out. Get help right away if: You have severe pain in your back, sides, or upper abdomen. You have any of these urinary symptoms: Severe pain while urinating. More blood in your urine or having blood in your urine when you did not before. Passing blood clots in your urine. Passing only a small amount of urine or being unable to pass any urine at all. You have severe nausea that leads to persistent vomiting. You faint. Summary After this procedure, it is common to have some pain, discomfort, or nausea when pieces (fragments) of the kidney stone move through the tube that carries urine from the kidney to the bladder (ureter). If this pain or nausea is severe, however, you should contact your health care provider. Return to your normal activities as told by your health care provider. Ask your health care provider what activities are safe for you. Drink enough fluid to keep your urine pale yellow. This helps any remaining pieces of the stone to pass, and it can  help prevent new stones from forming. If directed, strain your urine and keep all fragments for your health care provider to see. Fragments or stones may be as small as a grain of salt. Get help right away if you have severe pain in your back, sides, or upper abdomen, or if you have severe pain while urinating. This information is not intended to replace advice given to you by your health care provider. Make sure you discuss any questions you have with your health care provider. Document Revised: 01/11/2019 Document Reviewed: 01/12/2019 Elsevier Patient Education  [place].

## 2021-01-15 ENCOUNTER — Encounter (HOSPITAL_COMMUNITY): Payer: Self-pay | Admitting: Urology

## 2021-01-15 ENCOUNTER — Ambulatory Visit (HOSPITAL_COMMUNITY)
Admission: RE | Admit: 2021-01-15 | Discharge: 2021-01-15 | Disposition: A | Discharge status: Home / Self Care | Payer: Medicare Other | Source: Ambulatory Visit | Attending: Urology | Admitting: Urology

## 2021-01-15 ENCOUNTER — Encounter (HOSPITAL_COMMUNITY)
Admission: RE | Disposition: A | Discharge status: Home / Self Care | Payer: Self-pay | Source: Ambulatory Visit | Attending: Urology

## 2021-01-15 ENCOUNTER — Ambulatory Visit (HOSPITAL_COMMUNITY): Payer: Medicare Other

## 2021-01-15 DIAGNOSIS — Z85820 Personal history of malignant melanoma of skin: Secondary | ICD-10-CM | POA: Insufficient documentation

## 2021-01-15 DIAGNOSIS — I10 Essential (primary) hypertension: Secondary | ICD-10-CM | POA: Insufficient documentation

## 2021-01-15 DIAGNOSIS — Z6835 Body mass index (BMI) 35.0-35.9, adult: Secondary | ICD-10-CM | POA: Diagnosis not present

## 2021-01-15 DIAGNOSIS — Z96653 Presence of artificial knee joint, bilateral: Secondary | ICD-10-CM | POA: Diagnosis not present

## 2021-01-15 DIAGNOSIS — Z87442 Personal history of urinary calculi: Secondary | ICD-10-CM | POA: Insufficient documentation

## 2021-01-15 DIAGNOSIS — N200 Calculus of kidney: Secondary | ICD-10-CM | POA: Diagnosis present

## 2021-01-15 DIAGNOSIS — E669 Obesity, unspecified: Secondary | ICD-10-CM | POA: Insufficient documentation

## 2021-01-15 DIAGNOSIS — G4730 Sleep apnea, unspecified: Secondary | ICD-10-CM | POA: Insufficient documentation

## 2021-01-15 HISTORY — PX: EXTRACORPOREAL SHOCK WAVE LITHOTRIPSY: SHX1557

## 2021-01-15 SURGERY — LITHOTRIPSY, ESWL
Anesthesia: LOCAL | Laterality: Right

## 2021-01-15 MED ORDER — OXYCODONE-ACETAMINOPHEN 5-325 MG PO TABS: 1.0000 | ORAL_TABLET | ORAL | 0 refills | Status: DC | PRN

## 2021-01-15 MED ORDER — TAMSULOSIN HCL 0.4 MG PO CAPS: 0.4000 mg | ORAL_CAPSULE | Freq: Every day | ORAL | 0 refills | Status: DC

## 2021-01-15 MED ORDER — DIAZEPAM 5 MG PO TABS
ORAL_TABLET | ORAL | Status: AC
  Filled 2021-01-15: qty 2

## 2021-01-15 MED ORDER — DIAZEPAM 5 MG PO TABS
10.0000 mg | ORAL_TABLET | Freq: Once | ORAL | Status: AC
  Administered 2021-01-15: 10 mg via ORAL

## 2021-01-15 MED ORDER — ONDANSETRON HCL 4 MG PO TABS: 4.0000 mg | ORAL_TABLET | Freq: Every day | ORAL | 1 refills | Status: DC | PRN

## 2021-01-15 MED ORDER — DIPHENHYDRAMINE HCL 25 MG PO CAPS
25.0000 mg | ORAL_CAPSULE | ORAL | Status: AC
  Administered 2021-01-15: 25 mg via ORAL
  Filled 2021-01-15: qty 1

## 2021-01-15 MED ORDER — SODIUM CHLORIDE 0.9 % IV SOLN
Freq: Once | INTRAVENOUS | Status: AC
  Administered 2021-01-15: 1000 mL via INTRAVENOUS

## 2021-01-15 NOTE — Interval H&P Note (Signed)
History and Physical Interval Note:  01/15/2021 8:40 AM  Fernando Riley  has presented today for surgery, with the diagnosis of right renal calculus.  The various methods of treatment have been discussed with the patient and family. After consideration of risks, benefits and other options for treatment, the patient has consented to  Procedure(s): EXTRACORPOREAL SHOCK WAVE LITHOTRIPSY (ESWL) (Right) as a surgical intervention.  The patient's history has been reviewed, patient examined, no change in status, stable for surgery.  I have reviewed the patient's chart and labs.  Questions were answered to the patient's satisfaction.     Nicolette Bang

## 2021-01-17 ENCOUNTER — Encounter (HOSPITAL_COMMUNITY): Payer: Self-pay | Admitting: Urology

## 2021-01-31 ENCOUNTER — Ambulatory Visit (HOSPITAL_COMMUNITY)
Admission: RE | Admit: 2021-01-31 | Discharge: 2021-01-31 | Disposition: A | Discharge status: Home / Self Care | Payer: Medicare Other | Source: Ambulatory Visit | Attending: Urology | Admitting: Urology

## 2021-01-31 ENCOUNTER — Other Ambulatory Visit: Payer: Self-pay

## 2021-01-31 DIAGNOSIS — Z9889 Other specified postprocedural states: Secondary | ICD-10-CM | POA: Diagnosis not present

## 2021-02-01 ENCOUNTER — Ambulatory Visit (INDEPENDENT_AMBULATORY_CARE_PROVIDER_SITE_OTHER): Payer: Medicare Other | Admitting: Urology

## 2021-02-01 ENCOUNTER — Encounter: Payer: Self-pay | Admitting: Urology

## 2021-02-01 VITALS — BP 146/86 | Temp 98.2°F | Wt 264.0 lb

## 2021-02-01 DIAGNOSIS — N200 Calculus of kidney: Secondary | ICD-10-CM

## 2021-02-01 LAB — MICROSCOPIC EXAMINATION
Renal Epithel, UA: NONE SEEN /hpf
WBC, UA: NONE SEEN /hpf (ref 0–5)

## 2021-02-01 LAB — URINALYSIS, ROUTINE W REFLEX MICROSCOPIC
Bilirubin, UA: NEGATIVE
Glucose, UA: NEGATIVE
Ketones, UA: NEGATIVE
Leukocytes,UA: NEGATIVE
Nitrite, UA: NEGATIVE
Protein,UA: NEGATIVE
Specific Gravity, UA: 1.025 (ref 1.005–1.030)
Urobilinogen, Ur: 0.2 mg/dL (ref 0.2–1.0)
pH, UA: 5.5 (ref 5.0–7.5)

## 2021-02-01 NOTE — Progress Notes (Signed)

## 2021-02-01 NOTE — Progress Notes (Signed)
02/01/2021 10:02 AM   Fernando Riley [birth year] [id number]  Referring provider: Erven Colla, DO [place],  [place] [address]  Followup nephrolithiasis   HPI: Fernando Riley is a 70yo here for followup for nephrolithiasis. He underwent ESWL 2 weeks ago and passed multiple fragments. KUB shows residual 1-40mm calculus. No LUTS. No complaints today   PMH: Past Medical History:  Diagnosis Date   Arthritis    Cat allergies    Fatty liver    History of kidney stones    History of migraine    Hypertension    Impaired fasting glucose    Kidney stone    Melanoma ([place])    lower legs bilaterally   OSA (obstructive sleep apnea)    unable to sleep with use of CPAP   Peyronie disease    Pollen allergies    Reactive airways dysfunction syndrome (HCC)    Venous stasis     Surgical History: Past Surgical History:  Procedure Laterality Date   COLONOSCOPY N/A 01/21/2016   Procedure: COLONOSCOPY;  Surgeon: Daneil Dolin, MD;  [facility];  Service: Endoscopy;  Laterality: N/A;  1:00 PM - moved to 12:30 - office notified per [place] LITHOTRIPSY Left 01/17/2020   Procedure: EXTRACORPOREAL SHOCK WAVE LITHOTRIPSY (ESWL);  Surgeon: Cleon Gustin, MD;  Location: [facility];  Service: Urology;  Laterality: Left;   EXTRACORPOREAL SHOCK WAVE LITHOTRIPSY Right 02/14/2020   Procedure: EXTRACORPOREAL SHOCK WAVE LITHOTRIPSY (ESWL);  Surgeon: Cleon Gustin, MD;  Location: [facility];  Service: Urology;  Laterality: Right;   EXTRACORPOREAL SHOCK WAVE LITHOTRIPSY Right 01/15/2021   Procedure: EXTRACORPOREAL SHOCK WAVE LITHOTRIPSY (ESWL);  Surgeon: Cleon Gustin, MD;  Location: [facility];  Service: Urology;  Laterality: Right;   KNEE ARTHROSCOPY Bilateral    POLYPECTOMY  01/21/2016   Procedure: POLYPECTOMY;  Surgeon: Daneil Dolin, MD;  [facility]: [facility];  Service: Endoscopy;;  colon   SHOULDER ARTHROSCOPY Left    SHOULDER SURGERY Right 2016    TOOTH EXTRACTION     TOTAL KNEE ARTHROPLASTY Right 01/25/2019   Procedure: TOTAL KNEE ARTHROPLASTY;  Surgeon: Paralee Cancel, MD;  Location: [facility];  Service: Orthopedics;  Laterality: Right;  70 mins   TOTAL KNEE ARTHROPLASTY Left 03/22/2019   Procedure: TOTAL KNEE ARTHROPLASTY;  Surgeon: Paralee Cancel, MD;  Location: [facility];  Service: Orthopedics;  Laterality: Left;  70 mins    Home Medications:  Allergies as of 02/01/2021       Reactions   Other    Cats   Red Dye Hives   Pt's wife reported he gets hives with "food" red dye, but can take tablets that are red or pink.         Medication List        Accurate as of February 01, 2021 10:02 AM. If you have any questions, ask your nurse or doctor.          acetaminophen 500 MG tablet Commonly known as: TYLENOL Take 500 mg by mouth daily.   aspirin EC 81 MG tablet Take 81 mg by mouth daily.   enalapril 10 MG tablet Commonly known as: VASOTEC TAKE 1 TABLET EVERY DAY   ondansetron 4 MG tablet Commonly known as: Zofran Take 1 tablet (4 mg total) by mouth daily as needed for nausea or vomiting.   oxyCODONE-acetaminophen 5-325 MG tablet Commonly known as: Percocet Take 1 tablet by mouth every 4 (four) hours as  needed for severe pain.   tamsulosin 0.4 MG Caps capsule Commonly known as: FLOMAX Take 1 capsule (0.4 mg total) by mouth daily.   Turmeric 500 MG Caps Take 1,000 mg by mouth daily.        Allergies:  Allergies  Allergen Reactions   Other     Cats   Red Dye Hives    Pt's wife reported he gets hives with "food" red dye, but can take tablets that are red or pink.     Family History: Family History  Problem Relation Age of Onset   Heart disease Father    Diabetes Brother    Hyperlipidemia Brother     Social History:  reports that he has never smoked. He has never used smokeless tobacco. He reports that he does not drink alcohol and does not use drugs.  ROS: All other review of systems were reviewed  and are negative except what is noted above in HPI  Physical Exam: BP (!) 146/86   Temp 98.2 F (36.8 C)   Wt 264 lb (119.7 kg)   BMI 35.80 kg/m   Constitutional:  Alert and oriented, No acute distress. HEENT: [place] AT, moist mucus membranes.  Trachea midline, no masses. Cardiovascular: No clubbing, cyanosis, or edema. Respiratory: Normal respiratory effort, no increased work of breathing. GI: Abdomen is soft, nontender, nondistended, no abdominal masses GU: No CVA tenderness.  Lymph: No cervical or inguinal lymphadenopathy. Skin: No rashes, bruises or suspicious lesions. Neurologic: Grossly intact, no focal deficits, moving all 4 extremities. Psychiatric: Normal mood and affect.  Laboratory Data: Lab Results  Component Value Date   WBC 6.3 06/08/2020   HGB 14.6 06/08/2020   HCT 43.1 06/08/2020   MCV 92 06/08/2020   PLT 241 06/08/2020    Lab Results  Component Value Date   CREATININE 1.00 06/08/2020    Lab Results  Component Value Date   PSA 0.2 07/17/2014   PSA 0.31 07/11/2013    No results found for: TESTOSTERONE  No results found for: HGBA1C  Urinalysis    Component Value Date/Time   APPEARANCEUR Clear 01/02/2021 0855   GLUCOSEU Negative 01/02/2021 0855   BILIRUBINUR Negative 01/02/2021 0855   PROTEINUR Negative 01/02/2021 0855   UROBILINOGEN negative (A) 06/14/2019 1549   NITRITE Negative 01/02/2021 0855   LEUKOCYTESUR Negative 01/02/2021 0855    Lab Results  Component Value Date   LABMICR Comment 06/27/2020   WBCUA None seen 01/09/2020   LABEPIT 0-10 01/09/2020   MUCUS Present 01/09/2020   BACTERIA Many (A) 01/09/2020    Pertinent Imaging: KUb today: Images reviewed and discussed with the patient Results for orders placed during the hospital encounter of 01/15/21  DG Abd 1 View  Narrative CLINICAL DATA:  Nephrolithiasis.  Right-sided abdominal pain.  EXAM: ABDOMEN - 1 VIEW  COMPARISON:  January 01, 2021.  FINDINGS: The bowel gas  pattern is normal. Two right renal calculi are noted. Phleboliths are pelvis.  IMPRESSION: Right nephrolithiasis.  No evidence of bowel obstruction or ileus.   Electronically Signed By: Marijo Conception M.D. On: 01/15/2021 09:25  No results found for this or any previous visit.  No results found for this or any previous visit.  No results found for this or any previous visit.  No results found for this or any previous visit.  No results found for this or any previous visit.  No results found for this or any previous visit.  Results for orders placed during the hospital encounter of  01/09/20  CT RENAL STONE STUDY  Narrative CLINICAL DATA:  Left-sided flank pain for several days  EXAM: CT ABDOMEN AND PELVIS WITHOUT CONTRAST  TECHNIQUE: Multidetector CT imaging of the abdomen and pelvis was performed following the standard protocol without IV contrast.  COMPARISON:  12/09/2007  FINDINGS: Lower chest: No acute abnormality.  Hepatobiliary: No focal liver abnormality is seen. No gallstones, gallbladder wall thickening, or biliary dilatation.  Pancreas: Unremarkable. No pancreatic ductal dilatation or surrounding inflammatory changes.  Spleen: Normal in size without focal abnormality.  Adrenals/Urinary Tract: Adrenal glands are within normal limits. Kidneys are well visualized bilaterally with bilateral renal calculi increased in size and number when compared with the prior exam. The largest of these on the right measures 4 mm in the upper pole. Largest of these on the left measures 6 mm in the midportion of the left kidney. Mild fullness of the collecting system and left ureter is seen. Some Peri ureteral stranding is noted. Two distal left ureteral stones are noted. The dominant stone measures approximately 6 mm. The smaller adjacent stone measures 1-2 mm. This is best visualized on image number 81 of series 6. The bladder is decompressed.  Stomach/Bowel: The  appendix is within normal limits. Colon shows no obstructive or inflammatory changes. Small bowel and stomach appear within normal limits as well.  Vascular/Lymphatic: Aortic atherosclerosis. No enlarged abdominal or pelvic lymph nodes.  Reproductive: Prostate is unremarkable.  Other: No abdominal wall hernia or abnormality. No abdominopelvic ascites.  Musculoskeletal: No acute or significant osseous findings.  IMPRESSION: Two distal left ureteral stones as described above causing mild hydronephrosis and hydroureter.  Bilateral nonobstructing renal stones as described.   Electronically Signed By: Inez Catalina M.D. On: 01/09/2020 09:44   Assessment & Plan:    1. Nephrolithiasis -RTC 6-8 weeks with Renal US - Urinalysis, Routine w reflex microscopic   No follow-ups on file.  Nicolette Bang, MD  [facility] [place]

## 2021-02-01 NOTE — Patient Instructions (Signed)
Dietary Guidelines to Help Prevent Kidney Stones Kidney stones are deposits of minerals and salts that form inside your kidneys. Your risk of developing kidney stones may be greater depending on your diet, your lifestyle, the medicines you take, and whether you have certain medical conditions. Most people can lower their chances of developing kidney stones by following the instructions below. Your dietitian may give you more specific instructions depending on your overall health and the type of kidney stones you tend to develop. What are tips for following this plan? Reading food labels  Choose foods with "no salt added" or "low-salt" labels. Limit your salt (sodium) intake to less than 1,500 mg a day. Choose foods with calcium for each meal and snack. Try to eat about 300 mg of calcium at each meal. Foods that contain 200-500 mg of calcium a serving include: 8 oz (237 mL) of milk, calcium-fortifiednon-dairy milk, and calcium-fortifiedfruit juice. Calcium-fortified means that calcium has been added to these drinks. 8 oz (237 mL) of kefir, yogurt, and soy yogurt. 4 oz (114 g) of tofu. 1 oz (28 g) of cheese. 1 cup (150 g) of dried figs. 1 cup (91 g) of cooked broccoli. One 3 oz (85 g) can of sardines or mackerel. Most people need 1,000-1,500 mg of calcium a day. Talk to your dietitian about how much calcium is recommended for you. Shopping Buy plenty of fresh fruits and vegetables. Most people do not need to avoid fruits and vegetables, even if these foods contain nutrients that may contribute to kidney stones. When shopping for convenience foods, choose: Whole pieces of fruit. Pre-made salads with dressing on the side. Low-fat fruit and yogurt smoothies. Avoid buying frozen meals or prepared deli foods. These can be high in sodium. Look for foods with live cultures, such as yogurt and kefir. Choose high-fiber grains, such as whole-wheat breads, oat bran, and wheat cereals. Cooking Do not add  salt to food when cooking. Place a salt shaker on the table and allow each person to add his or her own salt to taste. Use vegetable protein, such as beans, textured vegetable protein (TVP), or tofu, instead of meat in pasta, casseroles, and soups. Meal planning Eat less salt, if told by your dietitian. To do this: Avoid eating processed or pre-made food. Avoid eating fast food. Eat less animal protein, including cheese, meat, poultry, or fish, if told by your dietitian. To do this: Limit the number of times you have meat, poultry, fish, or cheese each week. Eat a diet free of meat at least 2 days a week. Eat only one serving each day of meat, poultry, fish, or seafood. When you prepare animal protein, cut pieces into small portion sizes. For most meat and fish, one serving is about the size of the palm of your hand. Eat at least five servings of fresh fruits and vegetables each day. To do this: Keep fruits and vegetables on hand for snacks. Eat one piece of fruit or a handful of berries with breakfast. Have a salad and fruit at lunch. Have two kinds of vegetables at dinner. Limit foods that are high in a substance called oxalate. These include: Spinach (cooked), rhubarb, beets, sweet potatoes, and Swiss chard. Peanuts. Potato chips, french fries, and baked potatoes with skin on. Nuts and nut products. Chocolate. If you regularly take a diuretic medicine, make sure to eat at least 1 or 2 servings of fruits or vegetables that are high in potassium each day. These include: Avocado. Banana. Orange, prune,   carrot, or tomato juice. Baked potato. Cabbage. Beans and split peas. Lifestyle  Drink enough fluid to keep your urine pale yellow. This is the most important thing you can do. Spread your fluid intake throughout the day. If you drink alcohol: Limit how much you use to: 0-1 drink a day for women who are not pregnant. 0-2 drinks a day for men. Be aware of how much alcohol is in your  drink. In the [place] one drink equals one 12 oz bottle of beer (355 mL), one 5 oz glass of wine (148 mL), or one 1 oz glass of hard liquor (44 mL). Lose weight if told by your health care provider. Work with your dietitian to find an eating plan and weight loss strategies that work best for you. General information Talk to your health care provider and dietitian about taking daily supplements. You may be told the following depending on your health and the cause of your kidney stones: Not to take supplements with vitamin C. To take a calcium supplement. To take a daily probiotic supplement. To take other supplements such as magnesium, fish oil, or vitamin B6. Take over-the-counter and prescription medicines only as told by your health care provider. These include supplements. What foods should I limit? Limit your intake of the following foods, or eat them as told by your dietitian. Vegetables Spinach. Rhubarb. Beets. Canned vegetables. Pickles. Olives. Baked potatoes with skin. Grains Wheat bran. Baked goods. Salted crackers. Cereals high in sugar. Meats and other proteins Nuts. Nut butters. Large portions of meat, poultry, or fish. Salted, precooked, or cured meats, such as sausages, meat loaves, and hot dogs. Dairy Cheese. Beverages Regular soft drinks. Regular vegetable juice. Seasonings and condiments Seasoning blends with salt. Salad dressings. Soy sauce. Ketchup. Barbecue sauce. Other foods Canned soups. Canned pasta sauce. Casseroles. Pizza. Lasagna. Frozen meals. Potato chips. French fries. The items listed above may not be a complete list of foods and beverages you should limit. Contact a dietitian for more information. What foods should I avoid? Talk to your dietitian about specific foods you should avoid based on the type of kidney stones you have and your overall health. Fruits Grapefruit. The item listed above may not be a complete list of foods and beverages you should  avoid. Contact a dietitian for more information. Summary Kidney stones are deposits of minerals and salts that form inside your kidneys. You can lower your risk of kidney stones by making changes to your diet. The most important thing you can do is drink enough fluid. Drink enough fluid to keep your urine pale yellow. Talk to your dietitian about how much calcium you should have each day, and eat less salt and animal protein as told by your dietitian. This information is not intended to replace advice given to you by your health care provider. Make sure you discuss any questions you have with your health care provider. Document Revised: 03/24/2019 Document Reviewed: 03/24/2019 Elsevier Patient Education  2022 Elsevier Inc.  

## 2021-03-02 ENCOUNTER — Telehealth: Payer: Self-pay | Admitting: Nurse Practitioner

## 2021-03-02 DIAGNOSIS — I10 Essential (primary) hypertension: Secondary | ICD-10-CM

## 2021-03-19 ENCOUNTER — Ambulatory Visit (INDEPENDENT_AMBULATORY_CARE_PROVIDER_SITE_OTHER): Payer: Medicare Other | Admitting: Family Medicine

## 2021-03-19 ENCOUNTER — Other Ambulatory Visit: Payer: Self-pay

## 2021-03-19 ENCOUNTER — Telehealth: Payer: Self-pay | Admitting: Family Medicine

## 2021-03-19 DIAGNOSIS — I10 Essential (primary) hypertension: Secondary | ICD-10-CM

## 2021-03-19 DIAGNOSIS — Z79899 Other long term (current) drug therapy: Secondary | ICD-10-CM

## 2021-03-19 DIAGNOSIS — E7800 Pure hypercholesterolemia, unspecified: Secondary | ICD-10-CM

## 2021-03-19 DIAGNOSIS — Z0000 Encounter for general adult medical examination without abnormal findings: Secondary | ICD-10-CM

## 2021-03-19 DIAGNOSIS — E785 Hyperlipidemia, unspecified: Secondary | ICD-10-CM | POA: Insufficient documentation

## 2021-03-19 DIAGNOSIS — Z125 Encounter for screening for malignant neoplasm of prostate: Secondary | ICD-10-CM

## 2021-03-19 MED ORDER — ENALAPRIL MALEATE 10 MG PO TABS: 10.0000 mg | ORAL_TABLET | Freq: Every day | ORAL | 3 refills | Status: DC

## 2021-03-19 NOTE — Telephone Encounter (Signed)
Patient requesting if he needs to have lab before his 6 month follow up. He is scheduled for June 23  CB# [phone]

## 2021-03-19 NOTE — Patient Instructions (Signed)
Continue your medications.  Follow up in 6 months.   Merry Christmas  Dr. Neal Trulson 

## 2021-03-19 NOTE — Telephone Encounter (Signed)
To early to place lab orders in (labs only good for 6 months). Will place this message in reminder file and will place labs in closer to appt time. Left message to return call

## 2021-03-19 NOTE — Assessment & Plan Note (Signed)
Stable/fairly well-controlled.  Continue enalapril.

## 2021-03-19 NOTE — Assessment & Plan Note (Signed)
We discussed his ASCVD risk score. He does not want to start lipid-lowering therapy at this time.  He elects to continue to monitor.  Advised lifestyle changes. Stopping aspirin therapy.

## 2021-03-19 NOTE — Progress Notes (Signed)
Subjective:  Patient ID: Fernando Riley, male    DOB: 14-Oct-1950  Age: 70 y.o. MRN: [id number]  CC: Chief Complaint  Patient presents with   Hypertension    Follow up -needs refill of medication No problems or concerns    HPI:  70 year old male with hypertension, hyperlipidemia presents for follow-up.  Hypertension is stable/well-controlled on enalapril.  He states that he is in need of a refill in the near future.  Patient takes Flomax regularly due to longstanding history of kidney stones.  No current issues.  Had a lithotripsy in October.  Follows with Dr. Alyson Ingles.  Regarding the patient's hyperlipidemia, he is currently not on any lipid-lowering therapy.  His ASCVD 10-year risk score is 18.5%.  Intermediate risk.  We will discuss today.  Patient is currently on aspirin therapy daily.  Based on recent trials, I do not think that this is warranted.  We will discuss discontinuation today.  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/2019   Peyronie's disease 09/14/2017   Obstructive sleep apnea 08/20/2015   Essential hypertension, benign 07/20/2013    Social Hx   Social History   Socioeconomic History   Marital status: Married    Spouse name: Not on file   Number of children: Not on file   Years of education: Not on file   Highest education level: Not on file  Occupational History   Not on file  Tobacco Use   Smoking status: Never   Smokeless tobacco: Never  Vaping Use   Vaping Use: Never used  Substance and Sexual Activity   Alcohol use: No   Drug use: No   Sexual activity: Not on file  Other Topics Concern   Not on file  Social History Narrative   Not on file   Social Determinants of Health   Financial Resource Strain: Low Risk    Difficulty of Paying  Living Expenses: Not hard at all  Food Insecurity: No Food Insecurity   Worried About Charity fundraiser in the Last Year: Never true   [place] in the Last Year: Never true  Transportation Needs: No Transportation Needs   Lack of Transportation (Medical): No   Lack of Transportation (Non-Medical): No  Physical Activity: Inactive   Days of Exercise per Week: 0 days   Minutes of Exercise per Session: 0 min  Stress: No Stress Concern Present   Feeling of Stress : Not at all  Social Connections: Socially Integrated   Frequency of Communication with Friends and Family: More than three times a week   Frequency of Social Gatherings with Friends and Family: More than three times a week   Attends Religious Services: More than 4 times per year   Active Member of [employer] or Organizations: Yes   Attends Music therapist: More than 4 times per year   Marital Status: Married    Review of Systems  Constitutional: Negative.   Respiratory: Negative.    Cardiovascular: Negative.     Objective:  BP 134/86   Pulse 69   Temp 98.2 F (36.8 C) (Oral)   Ht 6' (1.829 m)   Wt 268 lb 3.2 oz (121.7 kg)   SpO2 99%   BMI 36.37 kg/m   BP/Weight 03/19/2021 02/01/2021 68/06/7288  Systolic BP 211  633 354  Diastolic BP 86 86 79  Wt. (Lbs) 268.2 264 -  BMI 36.37 35.8 -    Physical Exam Vitals and nursing note reviewed.  Constitutional:      General: He is not in acute distress.    Appearance: Normal appearance. He is not ill-appearing.  HENT:     Head: Normocephalic and atraumatic.  Eyes:     General:        Right eye: No discharge.        Left eye: No discharge.     Conjunctiva/sclera: Conjunctivae normal.  Cardiovascular:     Rate and Rhythm: Normal rate and regular rhythm.     Heart sounds: No murmur heard. Pulmonary:     Effort: Pulmonary effort is normal.     Breath sounds: Normal breath sounds. No wheezing, rhonchi or rales.  Neurological:     Mental Status: He is  alert.  Psychiatric:        Mood and Affect: Mood normal.        Behavior: Behavior normal.    Lab Results  Component Value Date   WBC 6.3 06/08/2020   HGB 14.6 06/08/2020   HCT 43.1 06/08/2020   PLT 241 06/08/2020   GLUCOSE 98 06/08/2020   CHOL 182 06/08/2020   TRIG 99 06/08/2020   HDL 51 06/08/2020   LDLCALC 113 (H) 06/08/2020   ALT 12 06/08/2020   AST 17 06/08/2020   NA 139 06/08/2020   K 4.3 06/08/2020   CL 103 06/08/2020   CREATININE 1.00 06/08/2020   BUN 17 06/08/2020   CO2 23 06/08/2020   PSA 0.2 07/17/2014     Assessment & Plan:   Problem List Items Addressed This Visit       Cardiovascular and Mediastinum   Essential hypertension, benign    Stable/fairly well-controlled.  Continue enalapril.      Relevant Medications   enalapril (VASOTEC) 10 MG tablet     Other   Hyperlipidemia    We discussed his ASCVD risk score. He does not want to start lipid-lowering therapy at this time.  He elects to continue to monitor.  Advised lifestyle changes. Stopping aspirin therapy.      Relevant Medications   enalapril (VASOTEC) 10 MG tablet    Meds ordered this encounter  Medications   enalapril (VASOTEC) 10 MG tablet    Sig: Take 1 tablet (10 mg total) by mouth daily. PLEASE SCHEDULE FOLLOW UP APPT    Dispense:  90 tablet    Refill:  3    Follow-up:  Return in about 6 months (around 09/17/2021) for Follow up Chronic medical issues.  [place]

## 2021-03-23 ENCOUNTER — Other Ambulatory Visit: Payer: Self-pay | Admitting: Urology

## 2021-03-23 DIAGNOSIS — N200 Calculus of kidney: Secondary | ICD-10-CM

## 2021-03-25 ENCOUNTER — Ambulatory Visit (HOSPITAL_COMMUNITY)
Admission: RE | Admit: 2021-03-25 | Discharge: 2021-03-25 | Disposition: A | Discharge status: Home / Self Care | Payer: Medicare Other | Source: Ambulatory Visit | Attending: Urology | Admitting: Urology

## 2021-03-25 ENCOUNTER — Other Ambulatory Visit: Payer: Self-pay

## 2021-03-25 DIAGNOSIS — N200 Calculus of kidney: Secondary | ICD-10-CM

## 2021-03-26 NOTE — Telephone Encounter (Signed)
Last labs completed 06/08/20 CBC, CMP14+EGFR, Lipid and PSA. Please advise. Thank you

## 2021-03-27 NOTE — Telephone Encounter (Signed)
Not letting me order PSA due to not having elevated PSA or any issues with PSA. OK not to order? Please advise. Thank you!

## 2021-03-28 NOTE — Telephone Encounter (Signed)
Please see message. °

## 2021-04-01 ENCOUNTER — Ambulatory Visit (HOSPITAL_COMMUNITY)
Admission: RE | Admit: 2021-04-01 | Discharge: 2021-04-01 | Disposition: A | Discharge status: Home / Self Care | Payer: Medicare Other | Source: Ambulatory Visit | Attending: Urology | Admitting: Urology

## 2021-04-01 ENCOUNTER — Other Ambulatory Visit: Payer: Self-pay

## 2021-04-01 DIAGNOSIS — N200 Calculus of kidney: Secondary | ICD-10-CM | POA: Diagnosis not present

## 2021-04-01 NOTE — Telephone Encounter (Signed)
Sent my chart message on 04/01/21

## 2021-04-05 ENCOUNTER — Ambulatory Visit: Payer: Medicare Other | Admitting: Urology

## 2021-04-12 ENCOUNTER — Encounter: Payer: Self-pay | Admitting: Urology

## 2021-04-12 ENCOUNTER — Ambulatory Visit (INDEPENDENT_AMBULATORY_CARE_PROVIDER_SITE_OTHER): Payer: Medicare Other | Admitting: Urology

## 2021-04-12 ENCOUNTER — Ambulatory Visit: Payer: Medicare Other | Admitting: Urology

## 2021-04-12 ENCOUNTER — Other Ambulatory Visit: Payer: Self-pay

## 2021-04-12 VITALS — BP 134/74 | HR 80 | Wt 265.0 lb

## 2021-04-12 DIAGNOSIS — N200 Calculus of kidney: Secondary | ICD-10-CM

## 2021-04-12 LAB — URINALYSIS, ROUTINE W REFLEX MICROSCOPIC
Bilirubin, UA: NEGATIVE
Glucose, UA: NEGATIVE
Leukocytes,UA: NEGATIVE
Nitrite, UA: NEGATIVE
RBC, UA: NEGATIVE
Specific Gravity, UA: >1.03 — ABNORMAL HIGH (ref 1.005–1.030)
Urobilinogen, Ur: 0.2 mg/dL (ref 0.2–1.0)
pH, UA: 5.5 (ref 5.0–7.5)

## 2021-04-12 LAB — MICROSCOPIC EXAMINATION
Epithelial Cells (non renal): NONE SEEN /hpf (ref 0–10)
RBC, Urine: NONE SEEN /hpf (ref 0–2)
Renal Epithel, UA: NONE SEEN /hpf
WBC, UA: NONE SEEN /hpf (ref 0–5)

## 2021-04-12 MED ORDER — TAMSULOSIN HCL 0.4 MG PO CAPS: 0.4000 mg | ORAL_CAPSULE | Freq: Every day | ORAL | 3 refills | Status: DC

## 2021-04-12 NOTE — Progress Notes (Signed)
Urological Symptom Review  Patient is experiencing the following symptoms: none   Review of Systems  Gastrointestinal (upper)  : Negative for upper GI symptoms  Gastrointestinal (lower) : Negative for lower GI symptoms  Constitutional : Negative for symptoms  Skin: Negative for skin symptoms  Eyes: Negative for eye symptoms  Ear/Nose/Throat : Negative for Ear/Nose/Throat symptoms  Hematologic/Lymphatic: Negative for Hematologic/Lymphatic symptoms  Cardiovascular : Negative for cardiovascular symptoms  Respiratory : Negative for respiratory symptoms  Endocrine: Negative for endocrine symptoms  Musculoskeletal: Joint pain  Neurological: Negative for neurological symptoms  Psychologic: Negative for psychiatric symptoms

## 2021-04-12 NOTE — Patient Instructions (Signed)
Dietary Guidelines to Help Prevent Kidney Stones Kidney stones are deposits of minerals and salts that form inside your kidneys. Your risk of developing kidney stones may be greater depending on your diet, your lifestyle, the medicines you take, and whether you have certain medical conditions. Most people can lower their chances of developing kidney stones by following the instructions below. Your dietitian may give you more specific instructions depending on your overall health and the type of kidney stones you tend to develop. What are tips for following this plan? Reading food labels  Choose foods with "no salt added" or "low-salt" labels. Limit your salt (sodium) intake to less than 1,500 mg a day. Choose foods with calcium for each meal and snack. Try to eat about 300 mg of calcium at each meal. Foods that contain 200-500 mg of calcium a serving include: 8 oz (237 mL) of milk, calcium-fortifiednon-dairy milk, and calcium-fortifiedfruit juice. Calcium-fortified means that calcium has been added to these drinks. 8 oz (237 mL) of kefir, yogurt, and soy yogurt. 4 oz (114 g) of tofu. 1 oz (28 g) of cheese. 1 cup (150 g) of dried figs. 1 cup (91 g) of cooked broccoli. One 3 oz (85 g) can of sardines or mackerel. Most people need 1,000-1,500 mg of calcium a day. Talk to your dietitian about how much calcium is recommended for you. Shopping Buy plenty of fresh fruits and vegetables. Most people do not need to avoid fruits and vegetables, even if these foods contain nutrients that may contribute to kidney stones. When shopping for convenience foods, choose: Whole pieces of fruit. Pre-made salads with dressing on the side. Low-fat fruit and yogurt smoothies. Avoid buying frozen meals or prepared deli foods. These can be high in sodium. Look for foods with live cultures, such as yogurt and kefir. Choose high-fiber grains, such as whole-wheat breads, oat bran, and wheat cereals. Cooking Do not add  salt to food when cooking. Place a salt shaker on the table and allow each person to add his or her own salt to taste. Use vegetable protein, such as beans, textured vegetable protein (TVP), or tofu, instead of meat in pasta, casseroles, and soups. Meal planning Eat less salt, if told by your dietitian. To do this: Avoid eating processed or pre-made food. Avoid eating fast food. Eat less animal protein, including cheese, meat, poultry, or fish, if told by your dietitian. To do this: Limit the number of times you have meat, poultry, fish, or cheese each week. Eat a diet free of meat at least 2 days a week. Eat only one serving each day of meat, poultry, fish, or seafood. When you prepare animal protein, cut pieces into small portion sizes. For most meat and fish, one serving is about the size of the palm of your hand. Eat at least five servings of fresh fruits and vegetables each day. To do this: Keep fruits and vegetables on hand for snacks. Eat one piece of fruit or a handful of berries with breakfast. Have a salad and fruit at lunch. Have two kinds of vegetables at dinner. Limit foods that are high in a substance called oxalate. These include: Spinach (cooked), rhubarb, beets, sweet potatoes, and Swiss chard. Peanuts. Potato chips, french fries, and baked potatoes with skin on. Nuts and nut products. Chocolate. If you regularly take a diuretic medicine, make sure to eat at least 1 or 2 servings of fruits or vegetables that are high in potassium each day. These include: Avocado. Banana. Orange, prune,   carrot, or tomato juice. Baked potato. Cabbage. Beans and split peas. Lifestyle  Drink enough fluid to keep your urine pale yellow. This is the most important thing you can do. Spread your fluid intake throughout the day. If you drink alcohol: Limit how much you use to: 0-1 drink a day for women who are not pregnant. 0-2 drinks a day for men. Be aware of how much alcohol is in your  drink. In the [place] one drink equals one 12 oz bottle of beer (355 mL), one 5 oz glass of wine (148 mL), or one 1 oz glass of hard liquor (44 mL). Lose weight if told by your health care provider. Work with your dietitian to find an eating plan and weight loss strategies that work best for you. General information Talk to your health care provider and dietitian about taking daily supplements. You may be told the following depending on your health and the cause of your kidney stones: Not to take supplements with vitamin C. To take a calcium supplement. To take a daily probiotic supplement. To take other supplements such as magnesium, fish oil, or vitamin B6. Take over-the-counter and prescription medicines only as told by your health care provider. These include supplements. What foods should I limit? Limit your intake of the following foods, or eat them as told by your dietitian. Vegetables Spinach. Rhubarb. Beets. Canned vegetables. Pickles. Olives. Baked potatoes with skin. Grains Wheat bran. Baked goods. Salted crackers. Cereals high in sugar. Meats and other proteins Nuts. Nut butters. Large portions of meat, poultry, or fish. Salted, precooked, or cured meats, such as sausages, meat loaves, and hot dogs. Dairy Cheese. Beverages Regular soft drinks. Regular vegetable juice. Seasonings and condiments Seasoning blends with salt. Salad dressings. Soy sauce. Ketchup. Barbecue sauce. Other foods Canned soups. Canned pasta sauce. Casseroles. Pizza. Lasagna. Frozen meals. Potato chips. French fries. The items listed above may not be a complete list of foods and beverages you should limit. Contact a dietitian for more information. What foods should I avoid? Talk to your dietitian about specific foods you should avoid based on the type of kidney stones you have and your overall health. Fruits Grapefruit. The item listed above may not be a complete list of foods and beverages you should  avoid. Contact a dietitian for more information. Summary Kidney stones are deposits of minerals and salts that form inside your kidneys. You can lower your risk of kidney stones by making changes to your diet. The most important thing you can do is drink enough fluid. Drink enough fluid to keep your urine pale yellow. Talk to your dietitian about how much calcium you should have each day, and eat less salt and animal protein as told by your dietitian. This information is not intended to replace advice given to you by your health care provider. Make sure you discuss any questions you have with your health care provider. Document Revised: 03/24/2019 Document Reviewed: 03/24/2019 Elsevier Patient Education  2022 Elsevier Inc.  

## 2021-04-12 NOTE — Progress Notes (Signed)
04/12/2021 11:43 AM   Fernando Riley March 24, 1951 [id number]  Referring provider: Coral Spikes, DO [place],  [place] [address]  Followup nephrolithiasis   HPI: Mr Frazer is a 70yo here for followup for nephrolithiasis. No stone events since last visit. KUB and renal US 12/11 shows bilateral 3-59mm calculi. No flank pain. No significant LUTS. No complaints today   PMH: Past Medical History:  Diagnosis Date   Arthritis    Cat allergies    Fatty liver    History of kidney stones    History of migraine    Hypertension    Impaired fasting glucose    Kidney stone    Melanoma ([place])    lower legs bilaterally   OSA (obstructive sleep apnea)    unable to sleep with use of CPAP   Peyronie disease    Pollen allergies    Reactive airways dysfunction syndrome (HCC)    Venous stasis     Surgical History: Past Surgical History:  Procedure Laterality Date   COLONOSCOPY N/A 01/21/2016   Procedure: COLONOSCOPY;  Surgeon: Daneil Dolin, MD;  [facility];  Service: Endoscopy;  Laterality: N/A;  1:00 PM - moved to 12:30 - office notified per Amelia LITHOTRIPSY Left 01/17/2020   Procedure: EXTRACORPOREAL SHOCK WAVE LITHOTRIPSY (ESWL);  Surgeon: Cleon Gustin, MD;  Location: [facility];  Service: Urology;  Laterality: Left;   EXTRACORPOREAL SHOCK WAVE LITHOTRIPSY Right 02/14/2020   Procedure: EXTRACORPOREAL SHOCK WAVE LITHOTRIPSY (ESWL);  Surgeon: Cleon Gustin, MD;  Location: [facility];  Service: Urology;  Laterality: Right;   EXTRACORPOREAL SHOCK WAVE LITHOTRIPSY Right 01/15/2021   Procedure: EXTRACORPOREAL SHOCK WAVE LITHOTRIPSY (ESWL);  Surgeon: Cleon Gustin, MD;  Location: [facility];  Service: Urology;  Laterality: Right;   KNEE ARTHROSCOPY Bilateral    POLYPECTOMY  01/21/2016   Procedure: POLYPECTOMY;  Surgeon: Daneil Dolin, MD;  Location: [facility];  Service: Endoscopy;;  colon   SHOULDER ARTHROSCOPY Left    SHOULDER  SURGERY Right 2016   TOOTH EXTRACTION     TOTAL KNEE ARTHROPLASTY Right 01/25/2019   Procedure: TOTAL KNEE ARTHROPLASTY;  Surgeon: Paralee Cancel, MD;  Location: [facility];  Service: Orthopedics;  Laterality: Right;  70 mins   TOTAL KNEE ARTHROPLASTY Left 03/22/2019   Procedure: TOTAL KNEE ARTHROPLASTY;  Surgeon: Paralee Cancel, MD;  Location: [facility];  Service: Orthopedics;  Laterality: Left;  70 mins    Home Medications:  Allergies as of 04/12/2021       Reactions   Other    Cats   Red Dye Hives   Pt's wife reported he gets hives with "food" red dye, but can take tablets that are red or pink.         Medication List        Accurate as of April 12, 2021 11:43 AM. If you have any questions, ask your nurse or doctor.          acetaminophen 500 MG tablet Commonly known as: TYLENOL Take 500 mg by mouth daily.   enalapril 10 MG tablet Commonly known as: VASOTEC Take 1 tablet (10 mg total) by mouth daily. PLEASE SCHEDULE FOLLOW UP APPT   ondansetron 4 MG tablet Commonly known as: Zofran Take 1 tablet (4 mg total) by mouth daily as needed for nausea or vomiting.   tamsulosin 0.4 MG Caps capsule Commonly known as: FLOMAX TAKE 1 CAPSULE EVERY DAY  Turmeric 500 MG Caps Take 1,000 mg by mouth daily.        Allergies:  Allergies  Allergen Reactions   Other     Cats   Red Dye Hives    Pt's wife reported he gets hives with "food" red dye, but can take tablets that are red or pink.     Family History: Family History  Problem Relation Age of Onset   Heart disease Father    Diabetes Brother    Hyperlipidemia Brother     Social History:  reports that he has never smoked. He has never used smokeless tobacco. He reports that he does not drink alcohol and does not use drugs.  ROS: All other review of systems were reviewed and are negative except what is noted above in HPI  Physical Exam: There were no vitals taken for this visit.  Constitutional:  Alert and  oriented, No acute distress. HEENT: [place] AT, moist mucus membranes.  Trachea midline, no masses. Cardiovascular: No clubbing, cyanosis, or edema. Respiratory: Normal respiratory effort, no increased work of breathing. GI: Abdomen is soft, nontender, nondistended, no abdominal masses GU: No CVA tenderness.  Lymph: No cervical or inguinal lymphadenopathy. Skin: No rashes, bruises or suspicious lesions. Neurologic: Grossly intact, no focal deficits, moving all 4 extremities. Psychiatric: Normal mood and affect.  Laboratory Data: Lab Results  Component Value Date   WBC 6.3 06/08/2020   HGB 14.6 06/08/2020   HCT 43.1 06/08/2020   MCV 92 06/08/2020   PLT 241 06/08/2020    Lab Results  Component Value Date   CREATININE 1.00 06/08/2020    Lab Results  Component Value Date   PSA 0.2 07/17/2014   PSA 0.31 07/11/2013    No results found for: TESTOSTERONE  No results found for: HGBA1C  Urinalysis    Component Value Date/Time   APPEARANCEUR Clear 02/01/2021 1006   GLUCOSEU Negative 02/01/2021 1006   BILIRUBINUR Negative 02/01/2021 1006   PROTEINUR Negative 02/01/2021 1006   UROBILINOGEN negative (A) 06/14/2019 1549   NITRITE Negative 02/01/2021 1006   LEUKOCYTESUR Negative 02/01/2021 1006    Lab Results  Component Value Date   LABMICR See below: 02/01/2021   WBCUA None seen 02/01/2021   LABEPIT 0-10 02/01/2021   MUCUS Present 02/01/2021   BACTERIA Few 02/01/2021    Pertinent Imaging: Renal US and KUB 03/24/2021: Images reivewed and discussed with the patient Results for orders placed in visit on 04/01/21  DG Abd 1 View  Narrative CLINICAL DATA:  Nephrolithiasis  EXAM: ABDOMEN - 1 VIEW  COMPARISON:  01/31/2021  FINDINGS: 2 supine frontal views of the abdomen and pelvis excludes the hemidiaphragms and bilateral flanks by collimation. The faint calcifications projecting over the bilateral renal silhouettes on prior study are again identified and unchanged. No  evidence of ureteral or bladder calculi. Stable phleboliths. Bowel gas pattern is unremarkable. No acute bony abnormalities.  IMPRESSION: 1. Stable faint bilateral renal calculi.   Electronically Signed By: Randa Ngo M.D. On: 04/01/2021 13:12  No results found for this or any previous visit.  No results found for this or any previous visit.  No results found for this or any previous visit.  Results for orders placed during the hospital encounter of 03/25/21  US RENAL  Narrative CLINICAL DATA:  History of nephrolithiasis. History of prior lithotripsy.  EXAM: RENAL / URINARY TRACT ULTRASOUND COMPLETE  COMPARISON:  Abdomen 01/31/2021. CT 01/09/2020. Ultrasound 11/07/2008.  FINDINGS: Right Kidney:  Renal measurements: 12.9 x 6.4 x  6.9 cm = volume: 264 mL. Echogenicity within normal limits. No mass or hydronephrosis visualized. 6 mm echogenicity consistent with nonobstructing calyceal stone versus vascular calcification.  Left Kidney:  Renal measurements: 12.9 x 6.1 x 6.4 cm = volume: 264 mL. Echogenicity within normal limits. No mass or hydronephrosis visualized. 5 mm echogenicity consistent with nonobstructing calyceal stone versus vascular calcification.  Bladder:  Appears normal for degree of bladder distention.  Other:  None.  IMPRESSION: 1. Single tiny echogenicity noted in both kidneys consistent with tiny nonobstructing stone or vascular calcification. No hydronephrosis. 2. No acute abnormality.   Electronically Signed By: Marcello Moores  Register M.D. On: 03/26/2021 06:16  No results found for this or any previous visit.  No results found for this or any previous visit.  Results for orders placed during the hospital encounter of 01/09/20  CT RENAL STONE STUDY  Narrative CLINICAL DATA:  Left-sided flank pain for several days  EXAM: CT ABDOMEN AND PELVIS WITHOUT CONTRAST  TECHNIQUE: Multidetector CT imaging of the abdomen and pelvis was  performed following the standard protocol without IV contrast.  COMPARISON:  12/09/2007  FINDINGS: Lower chest: No acute abnormality.  Hepatobiliary: No focal liver abnormality is seen. No gallstones, gallbladder wall thickening, or biliary dilatation.  Pancreas: Unremarkable. No pancreatic ductal dilatation or surrounding inflammatory changes.  Spleen: Normal in size without focal abnormality.  Adrenals/Urinary Tract: Adrenal glands are within normal limits. Kidneys are well visualized bilaterally with bilateral renal calculi increased in size and number when compared with the prior exam. The largest of these on the right measures 4 mm in the upper pole. Largest of these on the left measures 6 mm in the midportion of the left kidney. Mild fullness of the collecting system and left ureter is seen. Some Peri ureteral stranding is noted. Two distal left ureteral stones are noted. The dominant stone measures approximately 6 mm. The smaller adjacent stone measures 1-2 mm. This is best visualized on image number 81 of series 6. The bladder is decompressed.  Stomach/Bowel: The appendix is within normal limits. Colon shows no obstructive or inflammatory changes. Small bowel and stomach appear within normal limits as well.  Vascular/Lymphatic: Aortic atherosclerosis. No enlarged abdominal or pelvic lymph nodes.  Reproductive: Prostate is unremarkable.  Other: No abdominal wall hernia or abnormality. No abdominopelvic ascites.  Musculoskeletal: No acute or significant osseous findings.  IMPRESSION: Two distal left ureteral stones as described above causing mild hydronephrosis and hydroureter.  Bilateral nonobstructing renal stones as described.   Electronically Signed By: Inez Catalina M.D. On: 01/09/2020 09:44   Assessment & Plan:    1. Nephrolithiasis -We discussed the management of kidney stones. These options include observation, ureteroscopy, shockwave lithotripsy  (ESWL) and percutaneous nephrolithotomy (PCNL). We discussed which options are relevant to the patient's stone(s). We discussed the natural history of kidney stones as well as the complications of untreated stones and the impact on quality of life without treatment as well as with each of the above listed treatments. We also discussed the efficacy of each treatment in its ability to clear the stone burden. With any of these management options I discussed the signs and symptoms of infection and the need for emergent treatment should these be experienced. For each option we discussed the ability of each procedure to clear the patient of their stone burden.   For observation I described the risks which include but are not limited to silent renal damage, life-threatening infection, need for emergent surgery, failure to pass stone  and pain.   For ureteroscopy I described the risks which include bleeding, infection, damage to contiguous structures, positioning injury, ureteral stricture, ureteral avulsion, ureteral injury, need for prolonged ureteral stent, inability to perform ureteroscopy, need for an interval procedure, inability to clear stone burden, stent discomfort/pain, heart attack, stroke, pulmonary embolus and the inherent risks with general anesthesia.   For shockwave lithotripsy I described the risks which include arrhythmia, kidney contusion, kidney hemorrhage, need for transfusion, pain, inability to adequately break up stone, inability to pass stone fragments, Steinstrasse, infection associated with obstructing stones, need for alternate surgical procedure, need for repeat shockwave lithotripsy, MI, CVA, PE and the inherent risks with anesthesia/conscious sedation.   For PCNL I described the risks including positioning injury, pneumothorax, hydrothorax, need for chest tube, inability to clear stone burden, renal laceration, arterial venous fistula or malformation, need for embolization of kidney,  loss of kidney or renal function, need for repeat procedure, need for prolonged nephrostomy tube, ureteral avulsion, MI, CVA, PE and the inherent risks of general anesthesia.   - The patient would like to proceed with observation. RTC 6 months - Urinalysis, Routine w reflex microscopic   No follow-ups on file.  Nicolette Bang, MD  [facility] [place]

## 2021-04-17 ENCOUNTER — Ambulatory Visit: Payer: Medicare Other | Admitting: Physician Assistant

## 2021-04-17 NOTE — Telephone Encounter (Signed)
Call patient left message 04/17/2021

## 2021-04-26 NOTE — Telephone Encounter (Signed)
Had appointment 03/19/21 for medication followup

## 2021-06-27 DIAGNOSIS — L570 Actinic keratosis: Secondary | ICD-10-CM | POA: Diagnosis not present

## 2021-06-27 DIAGNOSIS — L820 Inflamed seborrheic keratosis: Secondary | ICD-10-CM | POA: Diagnosis not present

## 2021-06-27 DIAGNOSIS — L821 Other seborrheic keratosis: Secondary | ICD-10-CM | POA: Diagnosis not present

## 2021-07-10 DIAGNOSIS — H25013 Cortical age-related cataract, bilateral: Secondary | ICD-10-CM | POA: Diagnosis not present

## 2021-07-10 DIAGNOSIS — H11432 Conjunctival hyperemia, left eye: Secondary | ICD-10-CM | POA: Diagnosis not present

## 2021-07-10 DIAGNOSIS — H02885 Meibomian gland dysfunction left lower eyelid: Secondary | ICD-10-CM | POA: Diagnosis not present

## 2021-07-10 DIAGNOSIS — H0015 Chalazion left lower eyelid: Secondary | ICD-10-CM | POA: Diagnosis not present

## 2021-07-30 ENCOUNTER — Other Ambulatory Visit: Payer: Self-pay | Admitting: *Deleted

## 2021-07-30 ENCOUNTER — Telehealth: Payer: Self-pay

## 2021-07-30 DIAGNOSIS — I10 Essential (primary) hypertension: Secondary | ICD-10-CM

## 2021-07-30 DIAGNOSIS — N200 Calculus of kidney: Secondary | ICD-10-CM

## 2021-07-30 MED ORDER — TAMSULOSIN HCL 0.4 MG PO CAPS
0.4000 mg | ORAL_CAPSULE | Freq: Every day | ORAL | 3 refills | Status: DC
Start: 2021-07-30 — End: 2021-10-21

## 2021-07-30 MED ORDER — ENALAPRIL MALEATE 10 MG PO TABS: 10.0000 mg | ORAL_TABLET | Freq: Every day | ORAL | 0 refills | Status: DC

## 2021-07-30 NOTE — Telephone Encounter (Signed)
Patient states his insurance has changed and he needs [facility] ? ?Patient has follow up visit scheduled 09/17/21 ?

## 2021-07-30 NOTE — Telephone Encounter (Signed)
Left message to return call 

## 2021-07-30 NOTE — Telephone Encounter (Signed)
Patient called to request mail order for flomax.  ? ?Medication sent per patient request.  ?

## 2021-07-31 NOTE — Telephone Encounter (Signed)
Pt returned phone call.  

## 2021-08-05 ENCOUNTER — Telehealth: Payer: Self-pay | Admitting: *Deleted

## 2021-08-05 DIAGNOSIS — Z79899 Other long term (current) drug therapy: Secondary | ICD-10-CM

## 2021-08-05 DIAGNOSIS — Z125 Encounter for screening for malignant neoplasm of prostate: Secondary | ICD-10-CM

## 2021-08-05 DIAGNOSIS — E7800 Pure hypercholesterolemia, unspecified: Secondary | ICD-10-CM

## 2021-08-05 DIAGNOSIS — I10 Essential (primary) hypertension: Secondary | ICD-10-CM

## 2021-08-05 NOTE — Telephone Encounter (Signed)
From reminder file:  Patient wants to know if he will needs labs for his appt in June ?

## 2021-08-06 IMAGING — DX DG ABDOMEN 1V
2 series · 2 of 2 positions shown · non-contrast
Comparison: 01/17/2020, CT 01/09/2020

CLINICAL DATA: Post lithotripsy

EXAM:
ABDOMEN - 1 VIEW

[abdomen kub (1 of 2)]
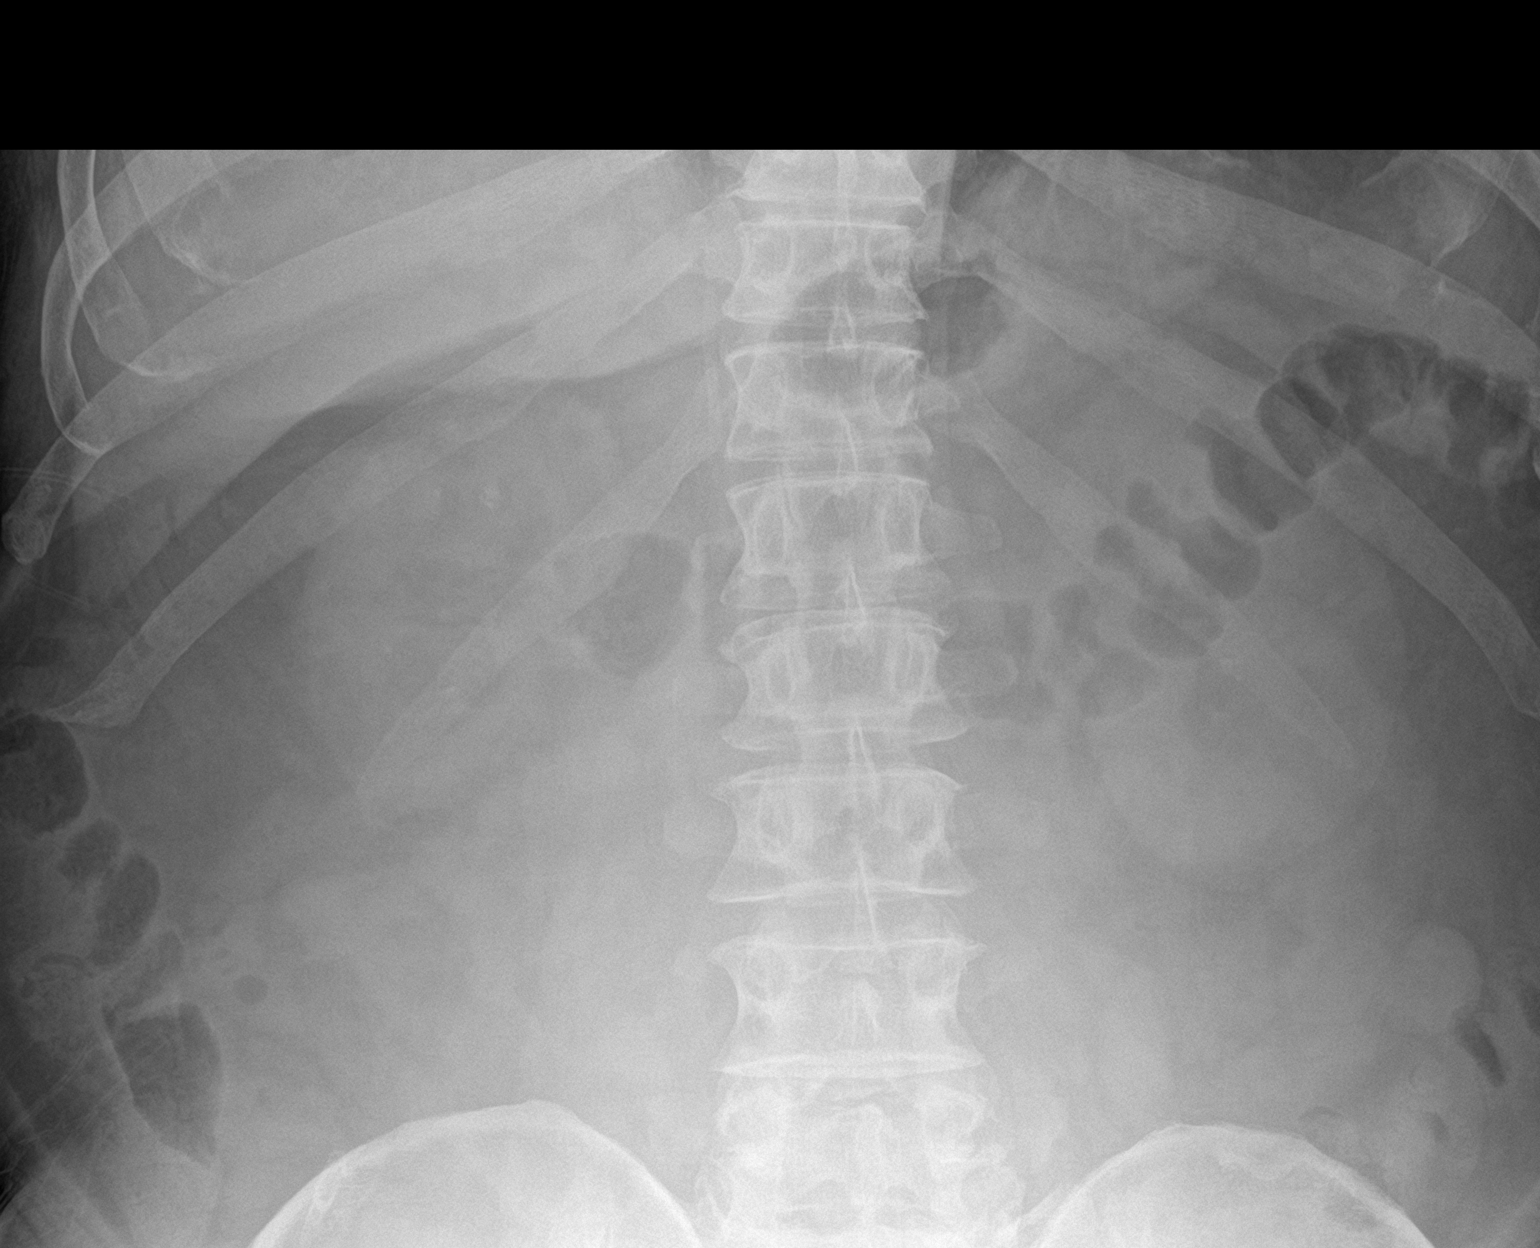

[abdomen kub (2 of 2)]
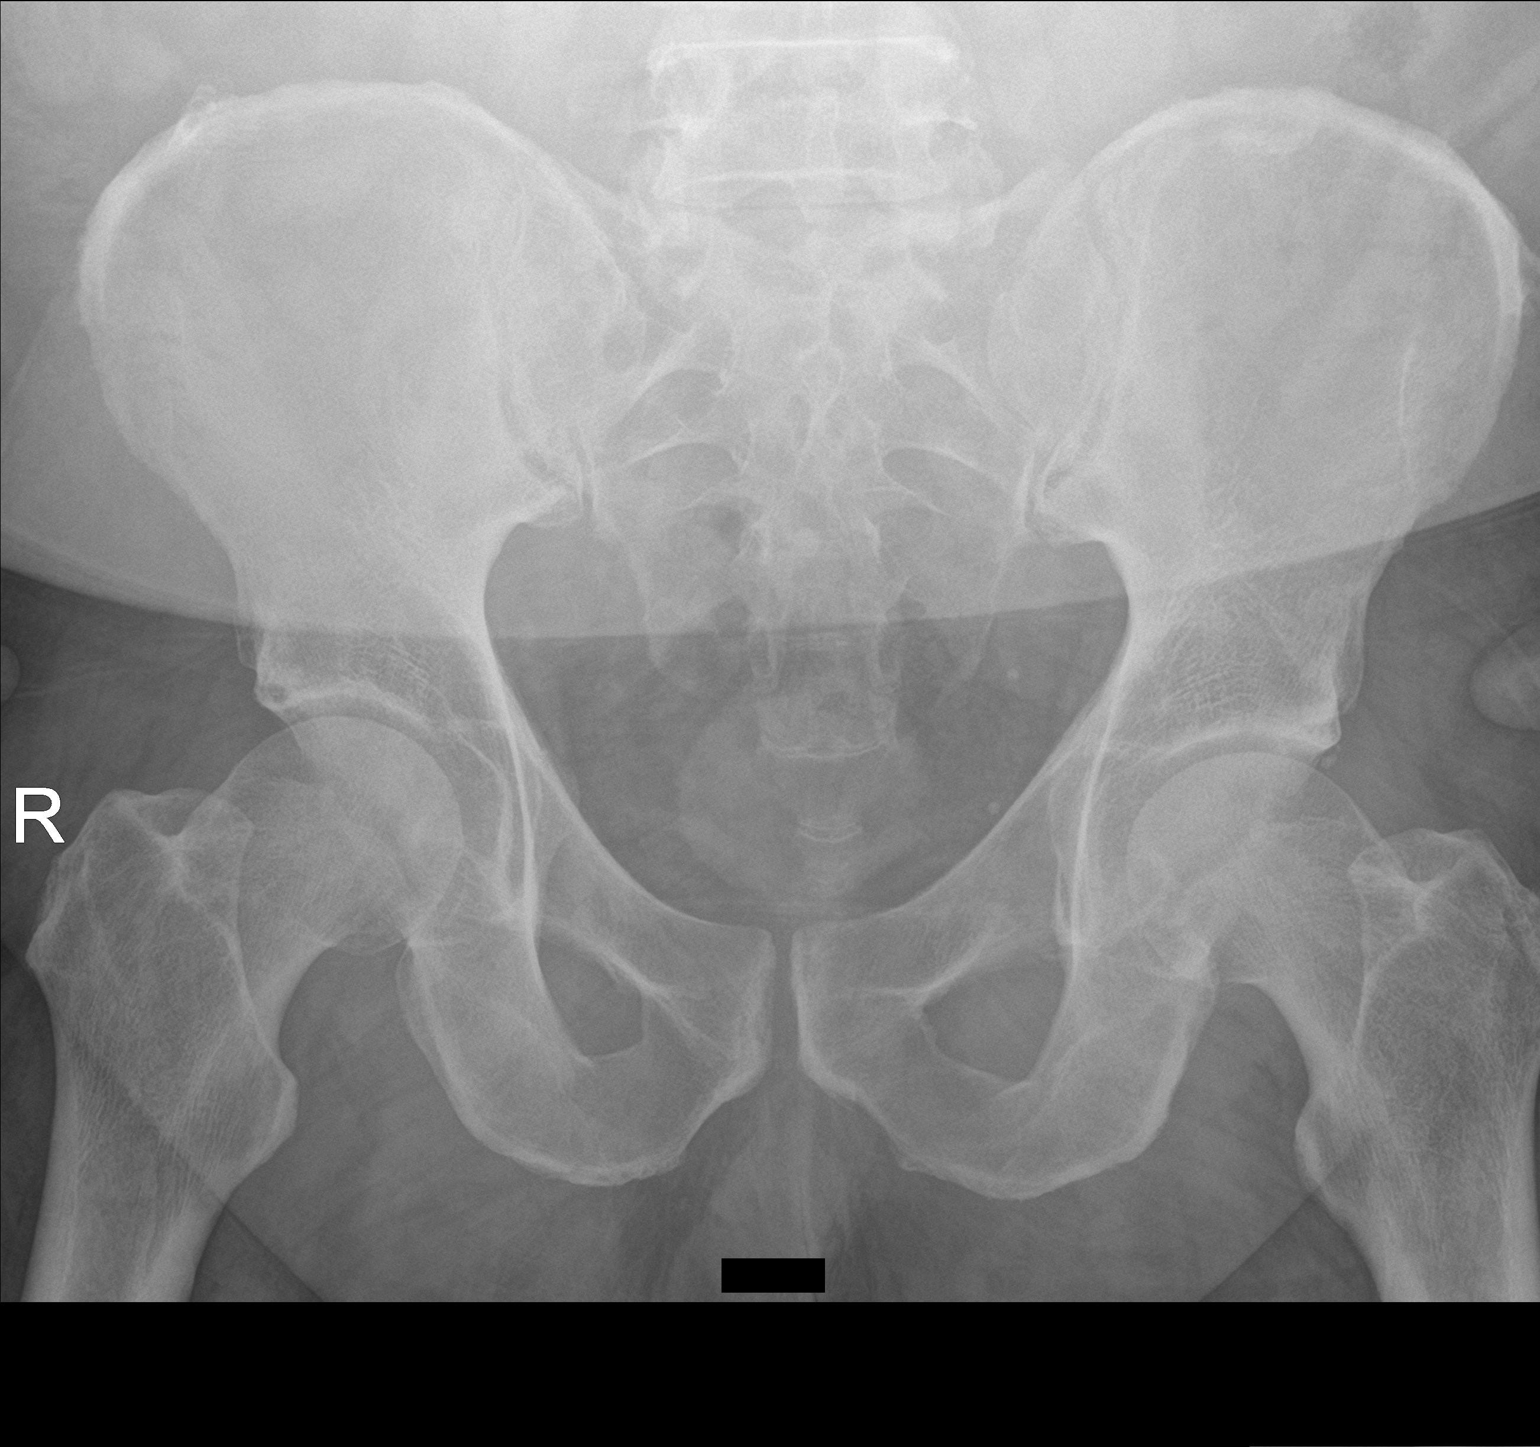

[2 of 2 positions shown; findings below may reference images not displayed]

FINDINGS: Small right greater than left intrarenal stones, measuring up to 4
mm on the right and 4 mm on the left. Phleboliths in the left
pelvis. The previously noted distal ureteral stone on the left on
comparison CT is not seen on today's radiograph.
IMPRESSION: 1. The previously noted distal left ureteral stone on comparison CT
is not seen on today's radiograph.
2. Bilateral intrarenal stones.

## 2021-08-06 NOTE — Telephone Encounter (Signed)
Coral Spikes, DO   ? ?Please order CBC, CMP w/ GFR, Lipid, PSA.   ? ?

## 2021-08-06 NOTE — Telephone Encounter (Signed)
Blood work ordered in Epic. Patient notified. 

## 2021-08-16 DIAGNOSIS — H2513 Age-related nuclear cataract, bilateral: Secondary | ICD-10-CM | POA: Diagnosis not present

## 2021-08-16 DIAGNOSIS — H0102A Squamous blepharitis right eye, upper and lower eyelids: Secondary | ICD-10-CM | POA: Diagnosis not present

## 2021-08-16 DIAGNOSIS — H0102B Squamous blepharitis left eye, upper and lower eyelids: Secondary | ICD-10-CM | POA: Diagnosis not present

## 2021-08-16 DIAGNOSIS — H0015 Chalazion left lower eyelid: Secondary | ICD-10-CM | POA: Diagnosis not present

## 2021-09-16 DIAGNOSIS — Z79899 Other long term (current) drug therapy: Secondary | ICD-10-CM | POA: Diagnosis not present

## 2021-09-16 DIAGNOSIS — E7800 Pure hypercholesterolemia, unspecified: Secondary | ICD-10-CM | POA: Diagnosis not present

## 2021-09-16 DIAGNOSIS — I10 Essential (primary) hypertension: Secondary | ICD-10-CM | POA: Diagnosis not present

## 2021-09-16 DIAGNOSIS — Z125 Encounter for screening for malignant neoplasm of prostate: Secondary | ICD-10-CM | POA: Diagnosis not present

## 2021-09-17 ENCOUNTER — Ambulatory Visit (INDEPENDENT_AMBULATORY_CARE_PROVIDER_SITE_OTHER): Payer: Medicare Other | Admitting: Family Medicine

## 2021-09-17 DIAGNOSIS — E7800 Pure hypercholesterolemia, unspecified: Secondary | ICD-10-CM

## 2021-09-17 DIAGNOSIS — I10 Essential (primary) hypertension: Secondary | ICD-10-CM

## 2021-09-17 LAB — LIPID PANEL
Chol/HDL Ratio: 3.9 ratio (ref 0.0–5.0)
Cholesterol, Total: 199 mg/dL (ref 100–199)
HDL: 51 mg/dL (ref >39)
LDL Chol Calc (NIH): 132 mg/dL — ABNORMAL HIGH (ref 0–99)
Triglycerides: 89 mg/dL (ref 0–149)
VLDL Cholesterol Cal: 16 mg/dL (ref 5–40)

## 2021-09-17 LAB — CBC WITH DIFFERENTIAL/PLATELET
Basophils Absolute: 0.1 10*3/uL (ref 0.0–0.2)
Basos: 1 %
EOS (ABSOLUTE): 0.5 10*3/uL — ABNORMAL HIGH (ref 0.0–0.4)
Eos: 6 %
Hematocrit: 41.1 % (ref 37.5–51.0)
Hemoglobin: 13.7 g/dL (ref 13.0–17.7)
Immature Grans (Abs): 0 10*3/uL (ref 0.0–0.1)
Immature Granulocytes: 0 %
Lymphocytes Absolute: 2.9 10*3/uL (ref 0.7–3.1)
Lymphs: 36 %
MCH: 31.9 pg (ref 26.6–33.0)
MCHC: 33.3 g/dL (ref 31.5–35.7)
MCV: 96 fL (ref 79–97)
Monocytes Absolute: 0.7 10*3/uL (ref 0.1–0.9)
Monocytes: 8 %
Neutrophils Absolute: 4 10*3/uL (ref 1.4–7.0)
Neutrophils: 49 %
Platelets: 227 10*3/uL (ref 150–450)
RBC: 4.3 x10E6/uL (ref 4.14–5.80)
RDW: 12.5 % (ref 11.6–15.4)
WBC: 8.2 10*3/uL (ref 3.4–10.8)

## 2021-09-17 LAB — CMP14+EGFR
ALT: 17 IU/L (ref 0–44)
AST: 20 IU/L (ref 0–40)
Albumin/Globulin Ratio: 1.8 (ref 1.2–2.2)
Albumin: 4.1 g/dL (ref 3.8–4.8)
Alkaline Phosphatase: 95 IU/L (ref 44–121)
BUN/Creatinine Ratio: 18 (ref 10–24)
BUN: 21 mg/dL (ref 8–27)
Bilirubin Total: 0.7 mg/dL (ref 0.0–1.2)
CO2: 25 mmol/L (ref 20–29)
Calcium: 9 mg/dL (ref 8.6–10.2)
Chloride: 106 mmol/L (ref 96–106)
Creatinine, Ser: 1.16 mg/dL (ref 0.76–1.27)
Globulin, Total: 2.3 g/dL (ref 1.5–4.5)
Glucose: 105 mg/dL — ABNORMAL HIGH (ref 70–99)
Potassium: 4.2 mmol/L (ref 3.5–5.2)
Sodium: 143 mmol/L (ref 134–144)
Total Protein: 6.4 g/dL (ref 6.0–8.5)
eGFR: 68 mL/min/{1.73_m2} (ref >59)

## 2021-09-17 LAB — PSA: Prostate Specific Ag, Serum: 0.2 ng/mL (ref 0.0–4.0)

## 2021-09-17 MED ORDER — ENALAPRIL MALEATE 10 MG PO TABS: 10.0000 mg | ORAL_TABLET | Freq: Every day | ORAL | 1 refills | Status: DC

## 2021-09-17 NOTE — Assessment & Plan Note (Signed)
Well-controlled.  Continue enalapril.  Refilled today.

## 2021-09-17 NOTE — Assessment & Plan Note (Addendum)
Patient is 10-year ASCVD risk score is 19.8%.  We discussed this today. Patient wants to pursue lifestyle changes.  Declines pharmacotherapy at this time.  We will continue to monitor.

## 2021-09-17 NOTE — Progress Notes (Signed)
Subjective:  Patient ID: Fernando Riley, male    DOB: [birth year]  Age: 71 y.o. MRN: [id number]  CC: Chief Complaint  Patient presents with   Hypertension    Follow up for chronic medical     HPI:  71 year old male with hypertension, OSA, obesity, hyperlipidemia presents for follow-up.  Patient states that he is doing well.  He has no complaints at this time.  Hypertension is at goal on enalapril.  He needs refill today.  Patient's recent lab work has returned and shows elevated LDL of 132.  We will discuss this today.   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/2019   Peyronie's disease 09/14/2017   Obstructive sleep apnea 08/20/2015   Essential hypertension, benign 07/20/2013    Social Hx   Social History   Socioeconomic History   Marital status: Married    Spouse name: Not on file   Number of children: Not on file   Years of education: Not on file   Highest education level: Not on file  Occupational History   Not on file  Tobacco Use   Smoking status: Never   Smokeless tobacco: Never  Vaping Use   Vaping Use: Never used  Substance and Sexual Activity   Alcohol use: No   Drug use: No   Sexual activity: Not on file  Other Topics Concern   Not on file  Social History Narrative   Not on file   Social Determinants of Health   Financial Resource Strain: Not on file  Food Insecurity: Not on file  Transportation Needs: Not on file  Physical Activity: Not on file  Stress: Not on file  Social Connections: Not on file    Review of Systems Per HPI  Objective:  BP 130/64   Pulse 71   Temp 97.9 F (36.6 C)   Ht 6' (1.829 m)   Wt 264 lb (119.7 kg)   SpO2 96%   BMI 35.80 kg/m      09/17/2021    8:33 AM 04/12/2021   11:43 AM 03/19/2021   10:07 AM   BP/Weight  Systolic BP 128 786 767  Diastolic BP 64 74 86  Wt. (Lbs) 264 265 268.2  BMI 35.8 kg/m2 35.94 kg/m2 36.37 kg/m2    Physical Exam Vitals and nursing note reviewed.  Constitutional:      General: He is not in acute distress.    Appearance: Normal appearance. He is obese.  HENT:     Head: Normocephalic and atraumatic.  Eyes:     General:        Right eye: No discharge.        Left eye: No discharge.     Conjunctiva/sclera: Conjunctivae normal.  Cardiovascular:     Rate and Rhythm: Normal rate and regular rhythm.  Pulmonary:     Effort: Pulmonary effort is normal.     Breath sounds: Normal breath sounds. No wheezing, rhonchi or rales.  Neurological:     Mental Status: He is alert.  Psychiatric:        Mood and Affect: Mood normal.        Behavior: Behavior normal.    Lab Results  Component Value Date   WBC 8.2 09/16/2021   HGB 13.7 09/16/2021   HCT 41.1 09/16/2021   PLT  227 09/16/2021   GLUCOSE 105 (H) 09/16/2021   CHOL 199 09/16/2021   TRIG 89 09/16/2021   HDL 51 09/16/2021   LDLCALC 132 (H) 09/16/2021   ALT 17 09/16/2021   AST 20 09/16/2021   NA 143 09/16/2021   K 4.2 09/16/2021   CL 106 09/16/2021   CREATININE 1.16 09/16/2021   BUN 21 09/16/2021   CO2 25 09/16/2021   PSA 0.2 07/17/2014     Assessment & Plan:   Problem List Items Addressed This Visit       Cardiovascular and Mediastinum   Essential hypertension, benign    Well-controlled.  Continue enalapril.  Refilled today.       Relevant Medications   enalapril (VASOTEC) 10 MG tablet     Other   Hyperlipidemia    Patient is 10-year ASCVD risk score is 19.8%.  We discussed this today. Patient wants to pursue lifestyle changes.  Declines pharmacotherapy at this time.  We will continue to monitor.       Relevant Medications   enalapril (VASOTEC) 10 MG tablet    Meds ordered this encounter  Medications   enalapril (VASOTEC) 10 MG tablet    Sig: Take 1 tablet (10 mg total) by  mouth daily.    Dispense:  90 tablet    Refill:  1    Follow-up:  6 months  [place]

## 2021-09-17 NOTE — Patient Instructions (Signed)
Continue your current medications.  Watch diet closely.  Follow up in 6 months.  Take care  Dr. Lacinda Axon

## 2021-09-30 ENCOUNTER — Telehealth: Payer: Self-pay | Admitting: Family Medicine

## 2021-09-30 NOTE — Telephone Encounter (Signed)
Patient is requesting a prescription for a C-Pap mask/machine.

## 2021-10-01 NOTE — Telephone Encounter (Signed)
Patient informed of md message. Verbalized understanding. ?

## 2021-10-04 DIAGNOSIS — G4733 Obstructive sleep apnea (adult) (pediatric): Secondary | ICD-10-CM | POA: Diagnosis not present

## 2021-10-19 ENCOUNTER — Ambulatory Visit (HOSPITAL_COMMUNITY)
Admission: RE | Admit: 2021-10-19 | Discharge: 2021-10-19 | Disposition: A | Discharge status: Home / Self Care | Payer: Medicare Other | Source: Ambulatory Visit | Attending: Urology | Admitting: Urology

## 2021-10-19 DIAGNOSIS — Z87442 Personal history of urinary calculi: Secondary | ICD-10-CM | POA: Diagnosis not present

## 2021-10-19 DIAGNOSIS — N200 Calculus of kidney: Secondary | ICD-10-CM | POA: Diagnosis not present

## 2021-10-21 ENCOUNTER — Encounter: Payer: Self-pay | Admitting: Urology

## 2021-10-21 ENCOUNTER — Ambulatory Visit (INDEPENDENT_AMBULATORY_CARE_PROVIDER_SITE_OTHER): Payer: Medicare Other | Admitting: Urology

## 2021-10-21 VITALS — BP 129/64 | HR 64

## 2021-10-21 DIAGNOSIS — N200 Calculus of kidney: Secondary | ICD-10-CM

## 2021-10-21 LAB — URINALYSIS, ROUTINE W REFLEX MICROSCOPIC
Bilirubin, UA: NEGATIVE
Glucose, UA: NEGATIVE
Leukocytes,UA: NEGATIVE
Nitrite, UA: NEGATIVE
Protein,UA: NEGATIVE
RBC, UA: NEGATIVE
Specific Gravity, UA: >1.03 — ABNORMAL HIGH (ref 1.005–1.030)
Urobilinogen, Ur: 0.2 mg/dL (ref 0.2–1.0)
pH, UA: 5 (ref 5.0–7.5)

## 2021-10-21 MED ORDER — TAMSULOSIN HCL 0.4 MG PO CAPS: 0.4000 mg | ORAL_CAPSULE | Freq: Every day | ORAL | 3 refills | Status: DC

## 2021-10-21 NOTE — Progress Notes (Signed)
10/21/2021 9:17 AM   Valentino Hue [birth year] [id number]  Referring provider: Coral Spikes, DO [place],  [place] [address]  nephrolithiasis   HPI: Mr Fernando Riley is a 49WG here for followup for nephrolithiasis. No stone events since last visit. No flank pain. KUB from 10/19/2021 shows no calculi. NO significant.    PMH: Past Medical History:  Diagnosis Date   Arthritis    Cat allergies    Fatty liver    History of kidney stones    History of migraine    Hypertension    Impaired fasting glucose    Kidney stone    Melanoma ([place])    lower legs bilaterally   OSA (obstructive sleep apnea)    unable to sleep with use of CPAP   Peyronie disease    Pollen allergies    Reactive airways dysfunction syndrome (HCC)    Venous stasis     Surgical History: Past Surgical History:  Procedure Laterality Date   COLONOSCOPY N/A 01/21/2016   Procedure: COLONOSCOPY;  Surgeon: Daneil Dolin, MD;  [facility]: [facility];  Service: Endoscopy;  Laterality: N/A;  1:00 PM - moved to 12:30 - office notified per [place] LITHOTRIPSY Left 01/17/2020   Procedure: EXTRACORPOREAL SHOCK WAVE LITHOTRIPSY (ESWL);  Surgeon: Cleon Gustin, MD;  Location: [facility];  Service: Urology;  Laterality: Left;   EXTRACORPOREAL SHOCK WAVE LITHOTRIPSY Right 02/14/2020   Procedure: EXTRACORPOREAL SHOCK WAVE LITHOTRIPSY (ESWL);  Surgeon: Cleon Gustin, MD;  Location: [facility];  Service: Urology;  Laterality: Right;   EXTRACORPOREAL SHOCK WAVE LITHOTRIPSY Right 01/15/2021   Procedure: EXTRACORPOREAL SHOCK WAVE LITHOTRIPSY (ESWL);  Surgeon: Cleon Gustin, MD;  Location: [facility];  Service: Urology;  Laterality: Right;   KNEE ARTHROSCOPY Bilateral    POLYPECTOMY  01/21/2016   Procedure: POLYPECTOMY;  Surgeon: Daneil Dolin, MD;  Location: [facility];  Service: Endoscopy;;  colon   SHOULDER ARTHROSCOPY Left    SHOULDER SURGERY Right 2016   TOOTH EXTRACTION     TOTAL  KNEE ARTHROPLASTY Right 01/25/2019   Procedure: TOTAL KNEE ARTHROPLASTY;  Surgeon: Paralee Cancel, MD;  Location: [facility];  Service: Orthopedics;  Laterality: Right;  70 mins   TOTAL KNEE ARTHROPLASTY Left 03/22/2019   Procedure: TOTAL KNEE ARTHROPLASTY;  Surgeon: Paralee Cancel, MD;  Location: [facility];  Service: Orthopedics;  Laterality: Left;  70 mins    Home Medications:  Allergies as of 10/21/2021       Reactions   Food Color Red    Other    Cats   Red Dye Hives   Pt's wife reported he gets hives with "food" red dye, but can take tablets that are red or pink.         Medication List        Accurate as of October 21, 2021  9:17 AM. If you have any questions, ask your nurse or doctor.          acetaminophen 500 MG tablet Commonly known as: TYLENOL Take 500 mg by mouth daily.   enalapril 10 MG tablet Commonly known as: VASOTEC Take 1 tablet (10 mg total) by mouth daily.   tamsulosin 0.4 MG Caps capsule Commonly known as: FLOMAX Take 1 capsule (0.4 mg total) by mouth daily.   Turmeric 500 MG Caps Take 1,000 mg by mouth daily.        Allergies:  Allergies  Allergen Reactions   Food  Color Red    Other     Cats   Red Dye Hives    Pt's wife reported he gets hives with "food" red dye, but can take tablets that are red or pink.     Family History: Family History  Problem Relation Age of Onset   Heart disease Father    Diabetes Brother    Hyperlipidemia Brother     Social History:  reports that he has never smoked. He has never used smokeless tobacco. He reports that he does not drink alcohol and does not use drugs.  ROS: All other review of systems were reviewed and are negative except what is noted above in HPI  Physical Exam: BP 129/64   Pulse 64   Constitutional:  Alert and oriented, No acute distress. HEENT: [place] AT, moist mucus membranes.  Trachea midline, no masses. Cardiovascular: No clubbing, cyanosis, or edema. Respiratory: Normal respiratory  effort, no increased work of breathing. GI: Abdomen is soft, nontender, nondistended, no abdominal masses GU: No CVA tenderness.  Lymph: No cervical or inguinal lymphadenopathy. Skin: No rashes, bruises or suspicious lesions. Neurologic: Grossly intact, no focal deficits, moving all 4 extremities. Psychiatric: Normal mood and affect.  Laboratory Data: Lab Results  Component Value Date   WBC 8.2 09/16/2021   HGB 13.7 09/16/2021   HCT 41.1 09/16/2021   MCV 96 09/16/2021   PLT 227 09/16/2021    Lab Results  Component Value Date   CREATININE 1.16 09/16/2021    Lab Results  Component Value Date   PSA 0.2 07/17/2014   PSA 0.31 07/11/2013    No results found for: "TESTOSTERONE"  No results found for: "HGBA1C"  Urinalysis    Component Value Date/Time   APPEARANCEUR Clear 04/12/2021 1150   GLUCOSEU Negative 04/12/2021 1150   BILIRUBINUR Negative 04/12/2021 1150   PROTEINUR 1+ (A) 04/12/2021 1150   UROBILINOGEN negative (A) 06/14/2019 1549   NITRITE Negative 04/12/2021 1150   LEUKOCYTESUR Negative 04/12/2021 1150    Lab Results  Component Value Date   LABMICR See below: 04/12/2021   WBCUA None seen 04/12/2021   LABEPIT None seen 04/12/2021   MUCUS Present 04/12/2021   BACTERIA Few (A) 04/12/2021    Pertinent Imaging: KUb today: Images reviewed and discussed with the patient Results for orders placed during the hospital encounter of 10/19/21  Abdomen 1 view (KUB)  Narrative CLINICAL DATA:  Follow-up renal stones.  No pain.  EXAM: ABDOMEN - 1 VIEW  COMPARISON:  April 01, 2021  FINDINGS: Both kidneys are partially obscured by bowel contents but no renal stones are identified. No ureteral stones noted. Calcifications in the left pelvis are stable, consistent with phleboliths. No other abnormalities.  IMPRESSION: No renal or ureteral stones identified.   Electronically Signed By: Dorise Bullion III M.D. On: 10/20/2021 10:39  No results found for  this or any previous visit.  No results found for this or any previous visit.  No results found for this or any previous visit.  Results for orders placed during the hospital encounter of 03/25/21  US RENAL  Narrative CLINICAL DATA:  History of nephrolithiasis. History of prior lithotripsy.  EXAM: RENAL / URINARY TRACT ULTRASOUND COMPLETE  COMPARISON:  Abdomen 01/31/2021. CT 01/09/2020. Ultrasound 11/07/2008.  FINDINGS: Right Kidney:  Renal measurements: 12.9 x 6.4 x 6.9 cm = volume: 264 mL. Echogenicity within normal limits. No mass or hydronephrosis visualized. 6 mm echogenicity consistent with nonobstructing calyceal stone versus vascular calcification.  Left Kidney:  Renal measurements:  12.9 x 6.1 x 6.4 cm = volume: 264 mL. Echogenicity within normal limits. No mass or hydronephrosis visualized. 5 mm echogenicity consistent with nonobstructing calyceal stone versus vascular calcification.  Bladder:  Appears normal for degree of bladder distention.  Other:  None.  IMPRESSION: 1. Single tiny echogenicity noted in both kidneys consistent with tiny nonobstructing stone or vascular calcification. No hydronephrosis. 2. No acute abnormality.   Electronically Signed By: Marcello Moores  Register M.D. On: 03/26/2021 06:16  No results found for this or any previous visit.  No results found for this or any previous visit.  Results for orders placed during the hospital encounter of 01/09/20  CT RENAL STONE STUDY  Narrative CLINICAL DATA:  Left-sided flank pain for several days  EXAM: CT ABDOMEN AND PELVIS WITHOUT CONTRAST  TECHNIQUE: Multidetector CT imaging of the abdomen and pelvis was performed following the standard protocol without IV contrast.  COMPARISON:  12/09/2007  FINDINGS: Lower chest: No acute abnormality.  Hepatobiliary: No focal liver abnormality is seen. No gallstones, gallbladder wall thickening, or biliary dilatation.  Pancreas:  Unremarkable. No pancreatic ductal dilatation or surrounding inflammatory changes.  Spleen: Normal in size without focal abnormality.  Adrenals/Urinary Tract: Adrenal glands are within normal limits. Kidneys are well visualized bilaterally with bilateral renal calculi increased in size and number when compared with the prior exam. The largest of these on the right measures 4 mm in the upper pole. Largest of these on the left measures 6 mm in the midportion of the left kidney. Mild fullness of the collecting system and left ureter is seen. Some Peri ureteral stranding is noted. Two distal left ureteral stones are noted. The dominant stone measures approximately 6 mm. The smaller adjacent stone measures 1-2 mm. This is best visualized on image number 81 of series 6. The bladder is decompressed.  Stomach/Bowel: The appendix is within normal limits. Colon shows no obstructive or inflammatory changes. Small bowel and stomach appear within normal limits as well.  Vascular/Lymphatic: Aortic atherosclerosis. No enlarged abdominal or pelvic lymph nodes.  Reproductive: Prostate is unremarkable.  Other: No abdominal wall hernia or abnormality. No abdominopelvic ascites.  Musculoskeletal: No acute or significant osseous findings.  IMPRESSION: Two distal left ureteral stones as described above causing mild hydronephrosis and hydroureter.  Bilateral nonobstructing renal stones as described.   Electronically Signed By: Inez Catalina M.D. On: 01/09/2020 09:44   Assessment & Plan:    1. Nephrolithiasis -RTC 1 year with KUB - Urinalysis, Routine w reflex microscopic   No follow-ups on file.  Nicolette Bang, MD  [facility] [place]

## 2021-10-21 NOTE — Patient Instructions (Signed)
Dietary Guidelines to Help Prevent Kidney Stones Kidney stones are deposits of minerals and salts that form inside your kidneys. Your risk of developing kidney stones may be greater depending on your diet, your lifestyle, the medicines you take, and whether you have certain medical conditions. Most people can lower their chances of developing kidney stones by following the instructions below. Your dietitian may give you more specific instructions depending on your overall health and the type of kidney stones you tend to develop. What are tips for following this plan? Reading food labels  Choose foods with "no salt added" or "low-salt" labels. Limit your salt (sodium) intake to less than 1,500 mg a day. Choose foods with calcium for each meal and snack. Try to eat about 300 mg of calcium at each meal. Foods that contain 200-500 mg of calcium a serving include: 8 oz (237 mL) of milk, calcium-fortifiednon-dairy milk, and calcium-fortifiedfruit juice. Calcium-fortified means that calcium has been added to these drinks. 8 oz (237 mL) of kefir, yogurt, and soy yogurt. 4 oz (114 g) of tofu. 1 oz (28 g) of cheese. 1 cup (150 g) of dried figs. 1 cup (91 g) of cooked broccoli. One 3 oz (85 g) can of sardines or mackerel. Most people need 1,000-1,500 mg of calcium a day. Talk to your dietitian about how much calcium is recommended for you. Shopping Buy plenty of fresh fruits and vegetables. Most people do not need to avoid fruits and vegetables, even if these foods contain nutrients that may contribute to kidney stones. When shopping for convenience foods, choose: Whole pieces of fruit. Pre-made salads with dressing on the side. Low-fat fruit and yogurt smoothies. Avoid buying frozen meals or prepared deli foods. These can be high in sodium. Look for foods with live cultures, such as yogurt and kefir. Choose high-fiber grains, such as whole-wheat breads, oat bran, and wheat cereals. Cooking Do not add  salt to food when cooking. Place a salt shaker on the table and allow each person to add his or her own salt to taste. Use vegetable protein, such as beans, textured vegetable protein (TVP), or tofu, instead of meat in pasta, casseroles, and soups. Meal planning Eat less salt, if told by your dietitian. To do this: Avoid eating processed or pre-made food. Avoid eating fast food. Eat less animal protein, including cheese, meat, poultry, or fish, if told by your dietitian. To do this: Limit the number of times you have meat, poultry, fish, or cheese each week. Eat a diet free of meat at least 2 days a week. Eat only one serving each day of meat, poultry, fish, or seafood. When you prepare animal protein, cut pieces into small portion sizes. For most meat and fish, one serving is about the size of the palm of your hand. Eat at least five servings of fresh fruits and vegetables each day. To do this: Keep fruits and vegetables on hand for snacks. Eat one piece of fruit or a handful of berries with breakfast. Have a salad and fruit at lunch. Have two kinds of vegetables at dinner. Limit foods that are high in a substance called oxalate. These include: Spinach (cooked), rhubarb, beets, sweet potatoes, and Swiss chard. Peanuts. Potato chips, french fries, and baked potatoes with skin on. Nuts and nut products. Chocolate. If you regularly take a diuretic medicine, make sure to eat at least 1 or 2 servings of fruits or vegetables that are high in potassium each day. These include: Avocado. Banana. Orange, prune,   carrot, or tomato juice. Baked potato. Cabbage. Beans and split peas. Lifestyle  Drink enough fluid to keep your urine pale yellow. This is the most important thing you can do. Spread your fluid intake throughout the day. If you drink alcohol: Limit how much you use to: 0-1 drink a day for women who are not pregnant. 0-2 drinks a day for men. Be aware of how much alcohol is in your  drink. In the [place] one drink equals one 12 oz bottle of beer (355 mL), one 5 oz glass of wine (148 mL), or one 1 oz glass of hard liquor (44 mL). Lose weight if told by your health care provider. Work with your dietitian to find an eating plan and weight loss strategies that work best for you. General information Talk to your health care provider and dietitian about taking daily supplements. You may be told the following depending on your health and the cause of your kidney stones: Not to take supplements with vitamin C. To take a calcium supplement. To take a daily probiotic supplement. To take other supplements such as magnesium, fish oil, or vitamin B6. Take over-the-counter and prescription medicines only as told by your health care provider. These include supplements. What foods should I limit? Limit your intake of the following foods, or eat them as told by your dietitian. Vegetables Spinach. Rhubarb. Beets. Canned vegetables. Pickles. Olives. Baked potatoes with skin. Grains Wheat bran. Baked goods. Salted crackers. Cereals high in sugar. Meats and other proteins Nuts. Nut butters. Large portions of meat, poultry, or fish. Salted, precooked, or cured meats, such as sausages, meat loaves, and hot dogs. Dairy Cheese. Beverages Regular soft drinks. Regular vegetable juice. Seasonings and condiments Seasoning blends with salt. Salad dressings. Soy sauce. Ketchup. Barbecue sauce. Other foods Canned soups. Canned pasta sauce. Casseroles. Pizza. Lasagna. Frozen meals. Potato chips. French fries. The items listed above may not be a complete list of foods and beverages you should limit. Contact a dietitian for more information. What foods should I avoid? Talk to your dietitian about specific foods you should avoid based on the type of kidney stones you have and your overall health. Fruits Grapefruit. The item listed above may not be a complete list of foods and beverages you should  avoid. Contact a dietitian for more information. Summary Kidney stones are deposits of minerals and salts that form inside your kidneys. You can lower your risk of kidney stones by making changes to your diet. The most important thing you can do is drink enough fluid. Drink enough fluid to keep your urine pale yellow. Talk to your dietitian about how much calcium you should have each day, and eat less salt and animal protein as told by your dietitian. This information is not intended to replace advice given to you by your health care provider. Make sure you discuss any questions you have with your health care provider. Document Revised: 12/10/2020 Document Reviewed: 12/10/2020 [employer]vier Patient Education  2023 [employer]  

## 2021-11-05 DIAGNOSIS — H25013 Cortical age-related cataract, bilateral: Secondary | ICD-10-CM | POA: Diagnosis not present

## 2021-11-05 DIAGNOSIS — H18413 Arcus senilis, bilateral: Secondary | ICD-10-CM | POA: Diagnosis not present

## 2021-11-05 DIAGNOSIS — H2513 Age-related nuclear cataract, bilateral: Secondary | ICD-10-CM | POA: Diagnosis not present

## 2021-11-05 DIAGNOSIS — H25043 Posterior subcapsular polar age-related cataract, bilateral: Secondary | ICD-10-CM | POA: Diagnosis not present

## 2021-11-25 DIAGNOSIS — L821 Other seborrheic keratosis: Secondary | ICD-10-CM | POA: Diagnosis not present

## 2021-11-25 DIAGNOSIS — L570 Actinic keratosis: Secondary | ICD-10-CM | POA: Diagnosis not present

## 2021-11-25 DIAGNOSIS — L820 Inflamed seborrheic keratosis: Secondary | ICD-10-CM | POA: Diagnosis not present

## 2022-01-15 ENCOUNTER — Ambulatory Visit (INDEPENDENT_AMBULATORY_CARE_PROVIDER_SITE_OTHER): Payer: Medicare Other

## 2022-01-15 VITALS — Ht 72.0 in | Wt 264.0 lb

## 2022-01-15 DIAGNOSIS — Z0000 Encounter for general adult medical examination without abnormal findings: Secondary | ICD-10-CM | POA: Diagnosis not present

## 2022-01-15 NOTE — Progress Notes (Signed)
Virtual Visit via Telephone Note  I connected with  Fernando Riley on 01/15/22 at  8:45 AM EDT by telephone and verified that I am speaking with the correct person using two identifiers.  Location: Patient: home Provider: [facility] Persons participating in the virtual visit: patient/Nurse Health Advisor   I discussed the limitations, risks, security and privacy concerns of performing an evaluation and management service by telephone and the availability of in person appointments. The patient expressed understanding and agreed to proceed.  Interactive audio and video telecommunications were attempted between this nurse and patient, however failed, due to patient having technical difficulties OR patient did not have access to video capability.  We continued and completed visit with audio only.  Some vital signs may be absent or patient reported.   Dionisio David, LPN  Subjective:   Fernando Riley is a 71 y.o. male who presents for Medicare Annual/Subsequent preventive examination.  Review of Systems     Cardiac Risk Factors include: advanced age (>6mn, >>50women);male gender;hypertension     Objective:    There were no vitals filed for this visit. There is no height or weight on file to calculate BMI.     01/15/2022    8:49 AM 01/15/2021    6:55 AM 09/04/2020    8:27 AM 02/14/2020    7:14 AM 02/07/2020   10:49 AM 03/22/2019   11:28 AM 03/22/2019    6:01 AM  Advanced Directives  Does Patient Have a Medical Advance Directive? No No Yes Yes Yes Yes Yes  Type of AComptrollerLiving will HRiberaLiving will HTen BroeckLiving will HWoodlakeLiving will HSt. John the BaptistLiving will  Does patient want to make changes to medical advance directive?   No - Patient declined No - Patient declined No - Patient declined No - Patient declined No - Patient declined  Copy of HLeithin Chart?   No - copy requested No - copy requested No - copy requested No - copy requested No - copy requested  Would patient like information on creating a medical advance directive? No - Patient declined No - Patient declined         Current Medications (verified) Outpatient Encounter Medications as of 01/15/2022  Medication Sig   acetaminophen (TYLENOL) 500 MG tablet Take 500 mg by mouth daily.   celecoxib (CELEBREX) 200 MG capsule 1po qd   enalapril (VASOTEC) 10 MG tablet Take 1 tablet (10 mg total) by mouth daily.   tamsulosin (FLOMAX) 0.4 MG CAPS capsule Take 1 capsule (0.4 mg total) by mouth daily.   Turmeric 500 MG CAPS Take 1,000 mg by mouth daily.    doxycycline (ADOXA) 50 MG tablet Take 50 mg by mouth 2 (two) times daily. (Patient not taking: Reported on 01/15/2022)   No facility-administered encounter medications on file as of 01/15/2022.    Allergies (verified) Cat hair extract, Food color red, Other, and Red dye   History: Past Medical History:  Diagnosis Date   Arthritis    Cat allergies    Fatty liver    History of kidney stones    History of migraine    Hypertension    Impaired fasting glucose    Kidney stone    Melanoma (HFerguson    lower legs bilaterally   OSA (obstructive sleep apnea)    unable to sleep with use of CPAP   Peyronie disease  Pollen allergies    Reactive airways dysfunction syndrome (HCC)    Venous stasis    Past Surgical History:  Procedure Laterality Date   COLONOSCOPY N/A 01/21/2016   Procedure: COLONOSCOPY;  Surgeon: Daneil Dolin, MD;  [facility];  Service: Endoscopy;  Laterality: N/A;  1:00 PM - moved to 12:30 - office notified per [facility] LITHOTRIPSY Left 01/17/2020   Procedure: EXTRACORPOREAL SHOCK WAVE LITHOTRIPSY (ESWL);  Surgeon: Cleon Gustin, MD;  Location: [facility];  Service: Urology;  Laterality: Left;   EXTRACORPOREAL SHOCK WAVE LITHOTRIPSY Right 02/14/2020   Procedure:  EXTRACORPOREAL SHOCK WAVE LITHOTRIPSY (ESWL);  Surgeon: Cleon Gustin, MD;  Location: [facility];  Service: Urology;  Laterality: Right;   EXTRACORPOREAL SHOCK WAVE LITHOTRIPSY Right 01/15/2021   Procedure: EXTRACORPOREAL SHOCK WAVE LITHOTRIPSY (ESWL);  Surgeon: Cleon Gustin, MD;  Location: [facility];  Service: Urology;  Laterality: Right;   KNEE ARTHROSCOPY Bilateral    POLYPECTOMY  01/21/2016   Procedure: POLYPECTOMY;  Surgeon: Daneil Dolin, MD;  [facility]: [facility];  Service: Endoscopy;;  colon   SHOULDER ARTHROSCOPY Left    SHOULDER SURGERY Right 2016   TOOTH EXTRACTION     TOTAL KNEE ARTHROPLASTY Right 01/25/2019   Procedure: TOTAL KNEE ARTHROPLASTY;  Surgeon: Paralee Cancel, MD;  Location: [facility];  Service: Orthopedics;  Laterality: Right;  70 mins   TOTAL KNEE ARTHROPLASTY Left 03/22/2019   Procedure: TOTAL KNEE ARTHROPLASTY;  Surgeon: Paralee Cancel, MD;  Location: [facility];  Service: Orthopedics;  Laterality: Left;  70 mins   Family History  Problem Relation Age of Onset   Heart disease Father    Diabetes Brother    Hyperlipidemia Brother    Social History   Socioeconomic History   Marital status: Married    Spouse name: Not on file   Number of children: Not on file   Years of education: Not on file   Highest education level: Not on file  Occupational History   Not on file  Tobacco Use   Smoking status: Never   Smokeless tobacco: Never  Vaping Use   Vaping Use: Never used  Substance and Sexual Activity   Alcohol use: No   Drug use: No   Sexual activity: Not on file  Other Topics Concern   Not on file  Social History Narrative   Not on file   Social Determinants of Health   Financial Resource Strain: Low Risk  (01/15/2022)   Overall Financial Resource Strain (CARDIA)    Difficulty of Paying Living Expenses: Not very hard  Food Insecurity: No Food Insecurity (01/15/2022)   Hunger Vital Sign    Worried About Running Out of Food in the Last Year: Never true     Ran Out of Food in the Last Year: Never true  Transportation Needs: No Transportation Needs (01/15/2022)   PRAPARE - Hydrologist (Medical): No    Lack of Transportation (Non-Medical): No  Physical Activity: Insufficiently Active (01/15/2022)   Exercise Vital Sign    Days of Exercise per Week: 3 days    Minutes of Exercise per Session: 30 min  Stress: No Stress Concern Present (01/15/2022)   Ginger Blue    Feeling of Stress : Not at all  Social Connections: Moderately Integrated (01/15/2022)   Social Connection and Isolation Panel [NHANES]    Frequency of Communication with Friends and Family: More than three  times a week    Frequency of Social Gatherings with Friends and Family: More than three times a week    Attends Religious Services: More than 4 times per year    Active Member of [employer] or Organizations: No    Attends Music therapist: Never    Marital Status: Married    Tobacco Counseling Counseling given: Not Answered   Clinical Intake:  Pre-visit preparation completed: Yes  Pain : No/denies pain     Nutritional Risks: None Diabetes: No  How often do you need to have someone help you when you read instructions, pamphlets, or other written materials from your doctor or pharmacy?: 1 - Never  Diabetic?no  Interpreter Needed?: No  Information entered by :: Kirke Shaggy, LPN   Activities of Daily Living    01/15/2022    8:50 AM  In your present state of health, do you have any difficulty performing the following activities:  Hearing? 0  Vision? 0  Difficulty concentrating or making decisions? 0  Walking or climbing stairs? 0  Dressing or bathing? 0  Doing errands, shopping? 0  Preparing Food and eating ? N  Using the Toilet? N  In the past six months, have you accidently leaked urine? N  Do you have problems with loss of bowel control? N  Managing  your Medications? N  Managing your Finances? N  Housekeeping or managing your Housekeeping? N    Patient Care Team: Coral Spikes, DO as PCP - General (Family Medicine)  Indicate any recent Medical Services you may have received from other than [facility] providers in the past year (date may be approximate).     Assessment:   This is a routine wellness examination for Fernando Riley.  Hearing/Vision screen Hearing Screening - Comments:: No aids Vision Screening - Comments:: Wears glasses- DrMartinek  Dietary issues and exercise activities discussed: Current Exercise Habits: Home exercise routine, Type of exercise: walking, Time (Minutes): 30, Frequency (Times/Week): 3, Weekly Exercise (Minutes/Week): 90, Intensity: Mild   Goals Addressed             This Visit's Progress    DIET - EAT MORE FRUITS AND VEGETABLES         Depression Screen    01/15/2022    8:47 AM 09/17/2021    8:39 AM 09/04/2020    8:30 AM 08/15/2020   10:09 AM 06/08/2020    2:54 PM 08/29/2019    1:26 PM 09/14/2017    8:56 AM  PHQ 2/9 Scores  PHQ - 2 Score 0 0 0 0 0 0 0  PHQ- 9 Score 0          Fall Risk    01/15/2022    8:50 AM 09/17/2021    8:39 AM 09/04/2020    8:29 AM 08/15/2020   10:09 AM 08/29/2019    1:26 PM  Fall Risk   Falls in the past year? 0 0 0 0 0  Number falls in past yr: 0 0 0 0   Injury with Fall? 0 0 0 0   Risk for fall due to : No Fall Risks No Fall Risks No Fall Risks No Fall Risks   Follow up Falls prevention discussed;Falls evaluation completed Falls evaluation completed Falls evaluation completed;Falls prevention discussed Falls evaluation completed Falls evaluation completed    FALL RISK PREVENTION PERTAINING TO THE HOME:  Any stairs in or around the home? Yes  If so, are there any without handrails? No  Home free of loose  throw rugs in walkways, pet beds, electrical cords, etc? Yes  Adequate lighting in your home to reduce risk of falls? Yes   ASSISTIVE DEVICES UTILIZED TO PREVENT  FALLS:  Life alert? No  Use of a cane, walker or w/c? No  Grab bars in the bathroom? Yes  Shower chair or bench in shower? No  Elevated toilet seat or a handicapped toilet? Yes    Cognitive Function:        01/15/2022    8:51 AM  6CIT Screen  What Year? 0 points  What month? 0 points  What time? 0 points  Count back from 20 0 points  Months in reverse 0 points  Repeat phrase 0 points  Total Score 0 points    Immunizations Immunization History  Administered Date(s) Administered   Influenza,inj,Quad PF,6+ Mos 01/12/2017, 01/16/2018, 01/12/2019   Influenza,inj,quad, With Preservative 01/16/2018   Influenza-Unspecified 01/28/2016, 01/12/2017, 01/18/2020, 01/12/2021   Pneumococcal Conjugate-13 01/12/2019   Pneumococcal Polysaccharide-23 09/14/2017   Td 02/08/2008   Zoster Recombinat (Shingrix) 06/16/2016    TDAP status: Up to date  Flu Vaccine status: Up to date  Pneumococcal vaccine status: Up to date  Covid-19 vaccine status: Declined, Education has been provided regarding the importance of this vaccine but patient still declined. Advised may receive this vaccine at local pharmacy or Health Dept.or vaccine clinic. Aware to provide a copy of the vaccination record if obtained from local pharmacy or Health Dept. Verbalized acceptance and understanding.  Qualifies for Shingles Vaccine? Yes   Zostavax completed No   Shingrix Completed?: No.    Education has been provided regarding the importance of this vaccine. Patient has been advised to call insurance company to determine out of pocket expense if they have not yet received this vaccine. Advised may also receive vaccine at local pharmacy or Health Dept. Verbalized acceptance and understanding.  Screening Tests Health Maintenance  Topic Date Due   Zoster Vaccines- Shingrix (2 of 2) 08/11/2016   TETANUS/TDAP  02/07/2018   INFLUENZA VACCINE  11/12/2021   COLONOSCOPY (Pts 45-71yr Insurance coverage will need to be  confirmed)  01/20/2026   Pneumonia Vaccine 69 Years old  Completed   HPV VACCINES  Aged Out   COVID-19 Vaccine  Discontinued   Hepatitis C Screening  Discontinued    Health Maintenance  Health Maintenance Due  Topic Date Due   Zoster Vaccines- Shingrix (2 of 2) 08/11/2016   TETANUS/TDAP  02/07/2018   INFLUENZA VACCINE  11/12/2021    Colorectal cancer screening: Type of screening: Colonoscopy. Completed 01/21/16. Repeat every 10 years  Lung Cancer Screening: (Low Dose CT Chest recommended if Age 640-80years, 30 pack-year currently smoking OR have quit w/in 15years.) does not qualify.    Additional Screening:  Hepatitis C Screening: does qualify; Completed no  Vision Screening: Recommended annual ophthalmology exams for early detection of glaucoma and other disorders of the eye. Is the patient up to date with their annual eye exam?  Yes  Who is the provider or what is the name of the office in which the patient attends annual eye exams? Dr.Martinek If pt is not established with a provider, would they like to be referred to a provider to establish care? No .   Dental Screening: Recommended annual dental exams for proper oral hygiene  Community Resource Referral / Chronic Care Management: CRR required this visit?  No   CCM required this visit?  No      Plan:     I have  personally reviewed and noted the following in the patient's chart:   Medical and social history Use of alcohol, tobacco or illicit drugs  Current medications and supplements including opioid prescriptions. Patient is not currently taking opioid prescriptions. Functional ability and status Nutritional status Physical activity Advanced directives List of other physicians Hospitalizations, surgeries, and ER visits in previous 12 months Vitals Screenings to include cognitive, depression, and falls Referrals and appointments  In addition, I have reviewed and discussed with patient certain preventive  protocols, quality metrics, and best practice recommendations. A written personalized care plan for preventive services as well as general preventive health recommendations were provided to patient.     Dionisio David, LPN   [id number]/12/[id number]   Nurse Notes: none

## 2022-01-15 NOTE — Patient Instructions (Signed)
Fernando Riley , Thank you for taking time to come for your Medicare Wellness Visit. I appreciate your ongoing commitment to your health goals. Please review the following plan we discussed and let me know if I can assist you in the future.   Screening recommendations/referrals: Colonoscopy: 01/21/16 Recommended yearly ophthalmology/optometry visit for glaucoma screening and checkup Recommended yearly dental visit for hygiene and checkup  Vaccinations: Influenza vaccine: 01/12/21 Pneumococcal vaccine: 01/12/19 Tdap vaccine: 02/08/08, due if have injury  Shingles vaccine: Shingrix 06/16/16   Covid-19: n/d  Advanced directives: no  Conditions/risks identified: none  Next appointment: Follow up in one year for your annual wellness visit. 01/20/23 @ 8:45 am by phone  Preventive Care 69 Years and Older, Male Preventive care refers to lifestyle choices and visits with your health care provider that can promote health and wellness. What does preventive care include? A yearly physical exam. This is also called an annual well check. Dental exams once or twice a year. Routine eye exams. Ask your health care provider how often you should have your eyes checked. Personal lifestyle choices, including: Daily care of your teeth and gums. Regular physical activity. Eating a healthy diet. Avoiding tobacco and drug use. Limiting alcohol use. Practicing safe sex. Taking low doses of aspirin every day. Taking vitamin and mineral supplements as recommended by your health care provider. What happens during an annual well check? The services and screenings done by your health care provider during your annual well check will depend on your age, overall health, lifestyle risk factors, and family history of disease. Counseling  Your health care provider may ask you questions about your: Alcohol use. Tobacco use. Drug use. Emotional well-being. Home and relationship well-being. Sexual activity. Eating  habits. History of falls. Memory and ability to understand (cognition). Work and work Statistician. Screening  You may have the following tests or measurements: Height, weight, and BMI. Blood pressure. Lipid and cholesterol levels. These may be checked every 5 years, or more frequently if you are over 76 years old. Skin check. Lung cancer screening. You may have this screening every year starting at age 74 if you have a 30-pack-year history of smoking and currently smoke or have quit within the past 15 years. Fecal occult blood test (FOBT) of the stool. You may have this test every year starting at age 35. Flexible sigmoidoscopy or colonoscopy. You may have a sigmoidoscopy every 5 years or a colonoscopy every 10 years starting at age 67. Prostate cancer screening. Recommendations will vary depending on your family history and other risks. Hepatitis C blood test. Hepatitis B blood test. Sexually transmitted disease (STD) testing. Diabetes screening. This is done by checking your blood sugar (glucose) after you have not eaten for a while (fasting). You may have this done every 1-3 years. Abdominal aortic aneurysm (AAA) screening. You may need this if you are a current or former smoker. Osteoporosis. You may be screened starting at age 24 if you are at high risk. Talk with your health care provider about your test results, treatment options, and if necessary, the need for more tests. Vaccines  Your health care provider may recommend certain vaccines, such as: Influenza vaccine. This is recommended every year. Tetanus, diphtheria, and acellular pertussis (Tdap, Td) vaccine. You may need a Td booster every 10 years. Zoster vaccine. You may need this after age 5. Pneumococcal 13-valent conjugate (PCV13) vaccine. One dose is recommended after age 42. Pneumococcal polysaccharide (PPSV23) vaccine. One dose is recommended after age 20. Talk  to your health care provider about which screenings and  vaccines you need and how often you need them. This information is not intended to replace advice given to you by your health care provider. Make sure you discuss any questions you have with your health care provider. Document Released: 04/27/2015 Document Revised: 12/19/2015 Document Reviewed: 01/30/2015 Elsevier Interactive Patient Education  2017 [place] Prevention in the Home Falls can cause injuries. They can happen to people of all ages. There are many things you can do to make your home safe and to help prevent falls. What can I do on the outside of my home? Regularly fix the edges of walkways and driveways and fix any cracks. Remove anything that might make you trip as you walk through a door, such as a raised step or threshold. Trim any bushes or trees on the path to your home. Use bright outdoor lighting. Clear any walking paths of anything that might make someone trip, such as rocks or tools. Regularly check to see if handrails are loose or broken. Make sure that both sides of any steps have handrails. Any raised decks and porches should have guardrails on the edges. Have any leaves, snow, or ice cleared regularly. Use sand or salt on walking paths during winter. Clean up any spills in your garage right away. This includes oil or grease spills. What can I do in the bathroom? Use night lights. Install grab bars by the toilet and in the tub and shower. Do not use towel bars as grab bars. Use non-skid mats or decals in the tub or shower. If you need to sit down in the shower, use a plastic, non-slip stool. Keep the floor dry. Clean up any water that spills on the floor as soon as it happens. Remove soap buildup in the tub or shower regularly. Attach bath mats securely with double-sided non-slip rug tape. Do not have throw rugs and other things on the floor that can make you trip. What can I do in the bedroom? Use night lights. Make sure that you have a light by your  bed that is easy to reach. Do not use any sheets or blankets that are too big for your bed. They should not hang down onto the floor. Have a firm chair that has side arms. You can use this for support while you get dressed. Do not have throw rugs and other things on the floor that can make you trip. What can I do in the kitchen? Clean up any spills right away. Avoid walking on wet floors. Keep items that you use a lot in easy-to-reach places. If you need to reach something above you, use a strong step stool that has a grab bar. Keep electrical cords out of the way. Do not use floor polish or wax that makes floors slippery. If you must use wax, use non-skid floor wax. Do not have throw rugs and other things on the floor that can make you trip. What can I do with my stairs? Do not leave any items on the stairs. Make sure that there are handrails on both sides of the stairs and use them. Fix handrails that are broken or loose. Make sure that handrails are as long as the stairways. Check any carpeting to make sure that it is firmly attached to the stairs. Fix any carpet that is loose or worn. Avoid having throw rugs at the top or bottom of the stairs. If you do have throw rugs,  attach them to the floor with carpet tape. Make sure that you have a light switch at the top of the stairs and the bottom of the stairs. If you do not have them, ask someone to add them for you. What else can I do to help prevent falls? Wear shoes that: Do not have high heels. Have rubber bottoms. Are comfortable and fit you well. Are closed at the toe. Do not wear sandals. If you use a stepladder: Make sure that it is fully opened. Do not climb a closed stepladder. Make sure that both sides of the stepladder are locked into place. Ask someone to hold it for you, if possible. Clearly mark and make sure that you can see: Any grab bars or handrails. First and last steps. Where the edge of each step is. Use tools that  help you move around (mobility aids) if they are needed. These include: Canes. Walkers. Scooters. Crutches. Turn on the lights when you go into a dark area. Replace any light bulbs as soon as they burn out. Set up your furniture so you have a clear path. Avoid moving your furniture around. If any of your floors are uneven, fix them. If there are any pets around you, be aware of where they are. Review your medicines with your doctor. Some medicines can make you feel dizzy. This can increase your chance of falling. Ask your doctor what other things that you can do to help prevent falls. This information is not intended to replace advice given to you by your health care provider. Make sure you discuss any questions you have with your health care provider. Document Released: 01/25/2009 Document Revised: 09/06/2015 Document Reviewed: 05/05/2014 Elsevier Interactive Patient Education  2017 [employer].

## 2022-01-16 DIAGNOSIS — H0102A Squamous blepharitis right eye, upper and lower eyelids: Secondary | ICD-10-CM | POA: Diagnosis not present

## 2022-01-16 DIAGNOSIS — H2513 Age-related nuclear cataract, bilateral: Secondary | ICD-10-CM | POA: Diagnosis not present

## 2022-01-16 DIAGNOSIS — H43813 Vitreous degeneration, bilateral: Secondary | ICD-10-CM | POA: Diagnosis not present

## 2022-01-16 DIAGNOSIS — H0102B Squamous blepharitis left eye, upper and lower eyelids: Secondary | ICD-10-CM | POA: Diagnosis not present

## 2022-01-17 DIAGNOSIS — H524 Presbyopia: Secondary | ICD-10-CM | POA: Diagnosis not present

## 2022-03-19 ENCOUNTER — Encounter: Payer: Self-pay | Admitting: Family Medicine

## 2022-03-19 ENCOUNTER — Ambulatory Visit (INDEPENDENT_AMBULATORY_CARE_PROVIDER_SITE_OTHER): Payer: Medicare Other | Admitting: Family Medicine

## 2022-03-19 VITALS — BP 128/83 | HR 59 | Wt 267.4 lb

## 2022-03-19 DIAGNOSIS — E7800 Pure hypercholesterolemia, unspecified: Secondary | ICD-10-CM

## 2022-03-19 DIAGNOSIS — N200 Calculus of kidney: Secondary | ICD-10-CM

## 2022-03-19 DIAGNOSIS — Z130 Encounter for screening for diseases of the blood and blood-forming organs and certain disorders involving the immune mechanism: Secondary | ICD-10-CM

## 2022-03-19 DIAGNOSIS — I10 Essential (primary) hypertension: Secondary | ICD-10-CM | POA: Diagnosis not present

## 2022-03-19 MED ORDER — ENALAPRIL MALEATE 10 MG PO TABS: 10.0000 mg | ORAL_TABLET | Freq: Every day | ORAL | 3 refills | Status: DC

## 2022-03-19 NOTE — Assessment & Plan Note (Signed)
Well-controlled.  Continue enalapril.  Refilled today.  Labs today.

## 2022-03-19 NOTE — Assessment & Plan Note (Signed)
Stable Continue Flomax 

## 2022-03-19 NOTE — Patient Instructions (Signed)
Continue your medication.  Labs when you can.  Follow up in 6 months.

## 2022-03-19 NOTE — Progress Notes (Signed)
Subjective:  Patient ID: Fernando Riley, male    DOB: 11/10/1950  Age: 71 y.o. MRN: [id number]  CC: Chief Complaint  Patient presents with   Hypertension    No questions/concerns; blood pressure doing well.     HPI:  71 year old male with hypertension, OSA, nephrolithiasis, hyperlipidemia, obesity presents for follow-up.  Patient blood pressure is well-controlled on enalapril.  Doing well at this time.  Wants to wait on his flu vaccine until after an upcoming funeral.  Patient continues on Flomax regarding nephrolithiasis.  No issues or concerns regarding this.  No pharmacotherapy for his hyperlipidemia at this time.  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   Obstructive sleep apnea 08/20/2015   Essential hypertension, benign 07/20/2013    Social Hx   Social History   Socioeconomic History   Marital status: Married    Spouse name: Not on file   Number of children: Not on file   Years of education: Not on file   Highest education level: Not on file  Occupational History   Not on file  Tobacco Use   Smoking status: Never   Smokeless tobacco: Never  Vaping Use   Vaping Use: Never used  Substance and Sexual Activity   Alcohol use: No   Drug use: No   Sexual activity: Not on file  Other Topics Concern   Not on file  Social History Narrative   Not on file   Social Determinants of Health   Financial Resource Strain: Low Risk  (01/15/2022)   Overall Financial Resource Strain (CARDIA)    Difficulty of Paying Living Expenses: Not very hard  Food Insecurity: No Food Insecurity (01/15/2022)   Hunger Vital Sign    Worried About Running Out of Food in the Last Year: Never true    Ran Out of Food in the Last Year: Never true  Transportation Needs: No Transportation  Needs (01/15/2022)   PRAPARE - Hydrologist (Medical): No    Lack of Transportation (Non-Medical): No  Physical Activity: Insufficiently Active (01/15/2022)   Exercise Vital Sign    Days of Exercise per Week: 3 days    Minutes of Exercise per Session: 30 min  Stress: No Stress Concern Present (01/15/2022)   [place]    Feeling of Stress : Not at all  Social Connections: Moderately Integrated (01/15/2022)   Social Connection and Isolation Panel [NHANES]    Frequency of Communication with Friends and Family: More than three times a week    Frequency of Social Gatherings with Friends and Family: More than three times a week    Attends Religious Services: More than 4 times per year    Active Member of [employer] or Organizations: No    Attends Archivist Meetings: Never    Marital Status: Married    Review of Systems  Constitutional: Negative.   Respiratory: Negative.    Cardiovascular: Negative.    Objective:  BP 128/83   Pulse (!) 59   Wt 267 lb 6.4 oz (121.3 kg)   SpO2 96%   BMI 36.27 kg/m      03/19/2022    8:26 AM 01/15/2022    8:57 AM 10/21/2021    8:52 AM  BP/Weight  Systolic BP 329  676  Diastolic BP 83  64  Wt. (Lbs) 267.4 264   BMI 36.27 kg/m2 35.8 kg/m2     Physical Exam Vitals and nursing note reviewed.  Constitutional:      General: He is not in acute distress.    Appearance: Normal appearance. He is obese.  HENT:     Head: Normocephalic and atraumatic.  Eyes:     General:        Right eye: No discharge.        Left eye: No discharge.     Conjunctiva/sclera: Conjunctivae normal.  Cardiovascular:     Rate and Rhythm: Normal rate and regular rhythm.  Pulmonary:     Effort: Pulmonary effort is normal.     Breath sounds: Normal breath sounds. No wheezing, rhonchi or rales.  Neurological:     Mental Status: He is alert.  Psychiatric:        Mood and  Affect: Mood normal.        Behavior: Behavior normal.     Lab Results  Component Value Date   WBC 8.2 09/16/2021   HGB 13.7 09/16/2021   HCT 41.1 09/16/2021   PLT 227 09/16/2021   GLUCOSE 105 (H) 09/16/2021   CHOL 199 09/16/2021   TRIG 89 09/16/2021   HDL 51 09/16/2021   LDLCALC 132 (H) 09/16/2021   ALT 17 09/16/2021   AST 20 09/16/2021   NA 143 09/16/2021   K 4.2 09/16/2021   CL 106 09/16/2021   CREATININE 1.16 09/16/2021   BUN 21 09/16/2021   CO2 25 09/16/2021   PSA 0.2 07/17/2014     Assessment & Plan:   Problem List Items Addressed This Visit       Cardiovascular and Mediastinum   Essential hypertension, benign - Primary    Well-controlled.  Continue enalapril.  Refilled today.  Labs today.      Relevant Medications   enalapril (VASOTEC) 10 MG tablet   Other Relevant Orders   CMP14+EGFR     Genitourinary   Nephrolithiasis    Stable.  Continue Flomax.        Other   Hyperlipidemia    Reassessing today.  Will continue to monitor closely.  May need statin therapy.      Relevant Medications   enalapril (VASOTEC) 10 MG tablet   Other Relevant Orders   Lipid panel   Other Visit Diagnoses     Screening for deficiency anemia       Relevant Orders   CBC       Meds ordered this encounter  Medications   enalapril (VASOTEC) 10 MG tablet    Sig: Take 1 tablet (10 mg total) by mouth daily.    Dispense:  90 tablet    Refill:  3    Follow-up:  Return in about 6 months (around 09/18/2022).  [place]

## 2022-03-19 NOTE — Assessment & Plan Note (Signed)
Reassessing today.  Will continue to monitor closely.  May need statin therapy.

## 2022-03-20 LAB — CBC
Hematocrit: 43.1 % (ref 37.5–51.0)
Hemoglobin: 14.3 g/dL (ref 13.0–17.7)
MCH: 31.5 pg (ref 26.6–33.0)
MCHC: 33.2 g/dL (ref 31.5–35.7)
MCV: 95 fL (ref 79–97)
Platelets: 232 10*3/uL (ref 150–450)
RBC: 4.54 x10E6/uL (ref 4.14–5.80)
RDW: 12 % (ref 11.6–15.4)
WBC: 7.7 10*3/uL (ref 3.4–10.8)

## 2022-03-20 LAB — LIPID PANEL
Chol/HDL Ratio: 3.8 ratio (ref 0.0–5.0)
Cholesterol, Total: 179 mg/dL (ref 100–199)
HDL: 47 mg/dL (ref >39)
LDL Chol Calc (NIH): 102 mg/dL — ABNORMAL HIGH (ref 0–99)
Triglycerides: 171 mg/dL — ABNORMAL HIGH (ref 0–149)
VLDL Cholesterol Cal: 30 mg/dL (ref 5–40)

## 2022-03-20 LAB — CMP14+EGFR
ALT: 16 IU/L (ref 0–44)
AST: 17 IU/L (ref 0–40)
Albumin/Globulin Ratio: 1.4 (ref 1.2–2.2)
Albumin: 3.9 g/dL (ref 3.9–4.9)
Alkaline Phosphatase: 97 IU/L (ref 44–121)
BUN/Creatinine Ratio: 20 (ref 10–24)
BUN: 22 mg/dL (ref 8–27)
Bilirubin Total: 0.5 mg/dL (ref 0.0–1.2)
CO2: 25 mmol/L (ref 20–29)
Calcium: 9.3 mg/dL (ref 8.6–10.2)
Chloride: 105 mmol/L (ref 96–106)
Creatinine, Ser: 1.08 mg/dL (ref 0.76–1.27)
Globulin, Total: 2.7 g/dL (ref 1.5–4.5)
Glucose: 92 mg/dL (ref 70–99)
Potassium: 4.7 mmol/L (ref 3.5–5.2)
Sodium: 143 mmol/L (ref 134–144)
Total Protein: 6.6 g/dL (ref 6.0–8.5)
eGFR: 74 mL/min/{1.73_m2} (ref >59)

## 2022-03-21 ENCOUNTER — Encounter: Payer: Self-pay | Admitting: *Deleted

## 2022-07-03 DIAGNOSIS — D225 Melanocytic nevi of trunk: Secondary | ICD-10-CM | POA: Diagnosis not present

## 2022-07-03 DIAGNOSIS — L821 Other seborrheic keratosis: Secondary | ICD-10-CM | POA: Diagnosis not present

## 2022-07-03 DIAGNOSIS — L570 Actinic keratosis: Secondary | ICD-10-CM | POA: Diagnosis not present

## 2022-07-03 DIAGNOSIS — L718 Other rosacea: Secondary | ICD-10-CM | POA: Diagnosis not present

## 2022-07-03 DIAGNOSIS — L820 Inflamed seborrheic keratosis: Secondary | ICD-10-CM | POA: Diagnosis not present

## 2022-07-03 DIAGNOSIS — D2271 Melanocytic nevi of right lower limb, including hip: Secondary | ICD-10-CM | POA: Diagnosis not present

## 2022-08-11 ENCOUNTER — Telehealth: Payer: Self-pay | Admitting: Family Medicine

## 2022-08-11 NOTE — Telephone Encounter (Signed)
Contacted Fernando Riley to schedule their annual wellness visit. Appointment made for 01/23/2023.  Thank you,  Judeth Cornfield,  AMB Clinical Support [facility] Program Direct Dial ??[phone]

## 2022-09-18 ENCOUNTER — Ambulatory Visit (INDEPENDENT_AMBULATORY_CARE_PROVIDER_SITE_OTHER): Payer: Medicare Other | Admitting: Family Medicine

## 2022-09-18 ENCOUNTER — Encounter: Payer: Self-pay | Admitting: Family Medicine

## 2022-09-18 VITALS — BP 133/80 | HR 57 | Temp 98.1°F | Ht 72.0 in | Wt 265.0 lb

## 2022-09-18 DIAGNOSIS — I10 Essential (primary) hypertension: Secondary | ICD-10-CM

## 2022-09-18 DIAGNOSIS — Z130 Encounter for screening for diseases of the blood and blood-forming organs and certain disorders involving the immune mechanism: Secondary | ICD-10-CM

## 2022-09-18 DIAGNOSIS — E7800 Pure hypercholesterolemia, unspecified: Secondary | ICD-10-CM

## 2022-09-18 MED ORDER — ENALAPRIL MALEATE 10 MG PO TABS: 10.0000 mg | ORAL_TABLET | Freq: Every day | ORAL | 3 refills | Status: DC

## 2022-09-18 NOTE — Assessment & Plan Note (Signed)
Rechecking lipids today.  High ASCVD risk score - predominantly due to age.

## 2022-09-18 NOTE — Patient Instructions (Signed)
Labs today.  Continue your medication.  Follow up in 6 months.  

## 2022-09-18 NOTE — Progress Notes (Signed)
Subjective:  Patient ID: Fernando Riley, male    DOB: [birth year]  Age: 72 y.o. MRN: [id number]  CC: Chief Complaint  Patient presents with   Hypertension    6 month follow up    HPI:  72 year old male with hypertension, OSA, history of nephrolithiasis, obesity, hyperlipidemia presents for follow-up.  Patient's hypertension has been stable although his BP is mildly elevated here today.  He is compliant with enalapril.  He took his medication this morning.  Needs refill.  Patient denies any chest pain or shortness of breath.  He states that he is doing well at this time.  Needs labs today.  He is requesting them.  He has no other complaints or concerns at this time.  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   Obstructive sleep apnea 08/20/2015   Essential hypertension, benign 07/20/2013    Social Hx   Social History   Socioeconomic History   Marital status: Married    Spouse name: Not on file   Number of children: Not on file   Years of education: Not on file   Highest education level: Not on file  Occupational History   Not on file  Tobacco Use   Smoking status: Never   Smokeless tobacco: Never  Vaping Use   Vaping Use: Never used  Substance and Sexual Activity   Alcohol use: No   Drug use: No   Sexual activity: Not on file  Other Topics Concern   Not on file  Social History Narrative   Not on file   Social Determinants of Health   Financial Resource Strain: Low Risk  (01/15/2022)   Overall Financial Resource Strain (CARDIA)    Difficulty of Paying Living Expenses: Not very hard  Food Insecurity: No Food Insecurity (01/15/2022)   Hunger Vital Sign    Worried About Running Out of Food in the Last Year: Never true    Ran Out of Food in the Last Year: Never  true  Transportation Needs: No Transportation Needs (01/15/2022)   PRAPARE - Administrator, Civil Service (Medical): No    Lack of Transportation (Non-Medical): No  Physical Activity: Insufficiently Active (01/15/2022)   Exercise Vital Sign    Days of Exercise per Week: 3 days    Minutes of Exercise per Session: 30 min  Stress: No Stress Concern Present (01/15/2022)   [employer] of Occupational Health - Occupational Stress Questionnaire    Feeling of Stress : Not at all  Social Connections: Moderately Integrated (01/15/2022)   Social Connection and Isolation Panel [NHANES]    Frequency of Communication with Friends and Family: More than three times a week    Frequency of Social Gatherings with Friends and Family: More than three times a week    Attends Religious Services: More than 4 times per year    Active Member of [employer] or Organizations: No    Attends Engineer, structural: Never    Marital Status: Married    Review of Systems Per HPI  Objective:  BP 133/80   Pulse (!) 57   Temp 98.1 F (36.7 C)   Ht 6' (1.829 m)   Wt 265 lb (120.2 kg)   SpO2 96%   BMI 35.94 kg/m      09/18/2022    8:49  AM 09/18/2022    8:25 AM 03/19/2022    8:26 AM  BP/Weight  Systolic BP 133 154 128  Diastolic BP 80 81 83  Wt. (Lbs)  265 267.4  BMI  35.94 kg/m2 36.27 kg/m2    Physical Exam Vitals and nursing note reviewed.  Constitutional:      General: He is not in acute distress.    Appearance: Normal appearance.  HENT:     Head: Normocephalic and atraumatic.  Eyes:     General:        Right eye: No discharge.        Left eye: No discharge.     Conjunctiva/sclera: Conjunctivae normal.  Cardiovascular:     Rate and Rhythm: Normal rate and regular rhythm.  Pulmonary:     Effort: Pulmonary effort is normal.     Breath sounds: Normal breath sounds. No wheezing, rhonchi or rales.  Neurological:     Mental Status: He is alert.  Psychiatric:        Mood and  Affect: Mood normal.        Behavior: Behavior normal.     Lab Results  Component Value Date   WBC 7.7 03/19/2022   HGB 14.3 03/19/2022   HCT 43.1 03/19/2022   PLT 232 03/19/2022   GLUCOSE 92 03/19/2022   CHOL 179 03/19/2022   TRIG 171 (H) 03/19/2022   HDL 47 03/19/2022   LDLCALC 102 (H) 03/19/2022   ALT 16 03/19/2022   AST 17 03/19/2022   NA 143 03/19/2022   K 4.7 03/19/2022   CL 105 03/19/2022   CREATININE 1.08 03/19/2022   BUN 22 03/19/2022   CO2 25 03/19/2022   PSA 0.2 07/17/2014     Assessment & Plan:   Problem List Items Addressed This Visit       Cardiovascular and Mediastinum   Essential hypertension, benign - Primary    Recheck of BP 133/80.  Continue enalapril.  Refilled today.      Relevant Medications   enalapril (VASOTEC) 10 MG tablet   Other Relevant Orders   CMP14+EGFR   Microalbumin / creatinine urine ratio     Other   Hyperlipidemia    Rechecking lipids today.  High ASCVD risk score - predominantly due to age.       Relevant Medications   enalapril (VASOTEC) 10 MG tablet   Other Relevant Orders   Lipid panel   Other Visit Diagnoses     Screening for deficiency anemia       Relevant Orders   CBC       Meds ordered this encounter  Medications   enalapril (VASOTEC) 10 MG tablet    Sig: Take 1 tablet (10 mg total) by mouth daily.    Dispense:  90 tablet    Refill:  3    Follow-up:  Return in about 6 months (around 03/20/2023).  Everlene Other DO [facility]

## 2022-09-18 NOTE — Assessment & Plan Note (Signed)
Recheck of BP 133/80.  Continue enalapril.  Refilled today.

## 2022-09-18 NOTE — Addendum Note (Signed)
Addended by: Alm Bustard R on: 09/18/2022 08:54 AM   Modules accepted: Orders

## 2022-09-21 LAB — MICROALBUMIN / CREATININE URINE RATIO
Creatinine, Urine: 147.1 mg/dL
Microalb/Creat Ratio: 2 mg/g creat (ref 0–29)
Microalbumin, Urine: 3.6 ug/mL

## 2022-09-21 LAB — CMP14+EGFR
ALT: 16 IU/L (ref 0–44)
AST: 20 IU/L (ref 0–40)
Albumin/Globulin Ratio: 1.8 (ref 1.2–2.2)
Albumin: 4.3 g/dL (ref 3.9–4.9)
Alkaline Phosphatase: 106 IU/L (ref 44–121)
BUN/Creatinine Ratio: 17 (ref 10–24)
BUN: 19 mg/dL (ref 8–27)
Bilirubin Total: 0.8 mg/dL (ref 0.0–1.2)
CO2: 26 mmol/L (ref 20–29)
Calcium: 10 mg/dL (ref 8.6–10.2)
Chloride: 104 mmol/L (ref 96–106)
Creatinine, Ser: 1.15 mg/dL (ref 0.76–1.27)
Globulin, Total: 2.4 g/dL (ref 1.5–4.5)
Glucose: 103 mg/dL — ABNORMAL HIGH (ref 70–99)
Potassium: 4.2 mmol/L (ref 3.5–5.2)
Sodium: 143 mmol/L (ref 134–144)
Total Protein: 6.7 g/dL (ref 6.0–8.5)
eGFR: 68 mL/min/{1.73_m2} (ref >59)

## 2022-09-21 LAB — CBC
Hematocrit: 40.6 % (ref 37.5–51.0)
Hemoglobin: 13.8 g/dL (ref 13.0–17.7)
MCH: 31.4 pg (ref 26.6–33.0)
MCHC: 34 g/dL (ref 31.5–35.7)
MCV: 92 fL (ref 79–97)
Platelets: 231 10*3/uL (ref 150–450)
RBC: 4.4 x10E6/uL (ref 4.14–5.80)
RDW: 12.2 % (ref 11.6–15.4)
WBC: 7.8 10*3/uL (ref 3.4–10.8)

## 2022-09-21 LAB — LIPID PANEL
Chol/HDL Ratio: 3.8 ratio (ref 0.0–5.0)
Cholesterol, Total: 204 mg/dL — ABNORMAL HIGH (ref 100–199)
HDL: 54 mg/dL (ref >39)
LDL Chol Calc (NIH): 131 mg/dL — ABNORMAL HIGH (ref 0–99)
Triglycerides: 104 mg/dL (ref 0–149)
VLDL Cholesterol Cal: 19 mg/dL (ref 5–40)

## 2022-09-30 ENCOUNTER — Encounter: Payer: Self-pay | Admitting: *Deleted

## 2022-10-07 IMAGING — CR DG ABDOMEN 1V
2 series · 2 of 2 positions shown · non-contrast
Comparison: 01/31/2021

CLINICAL DATA: Nephrolithiasis

EXAM:
ABDOMEN - 1 VIEW

[t ap supine (1 of 2)]
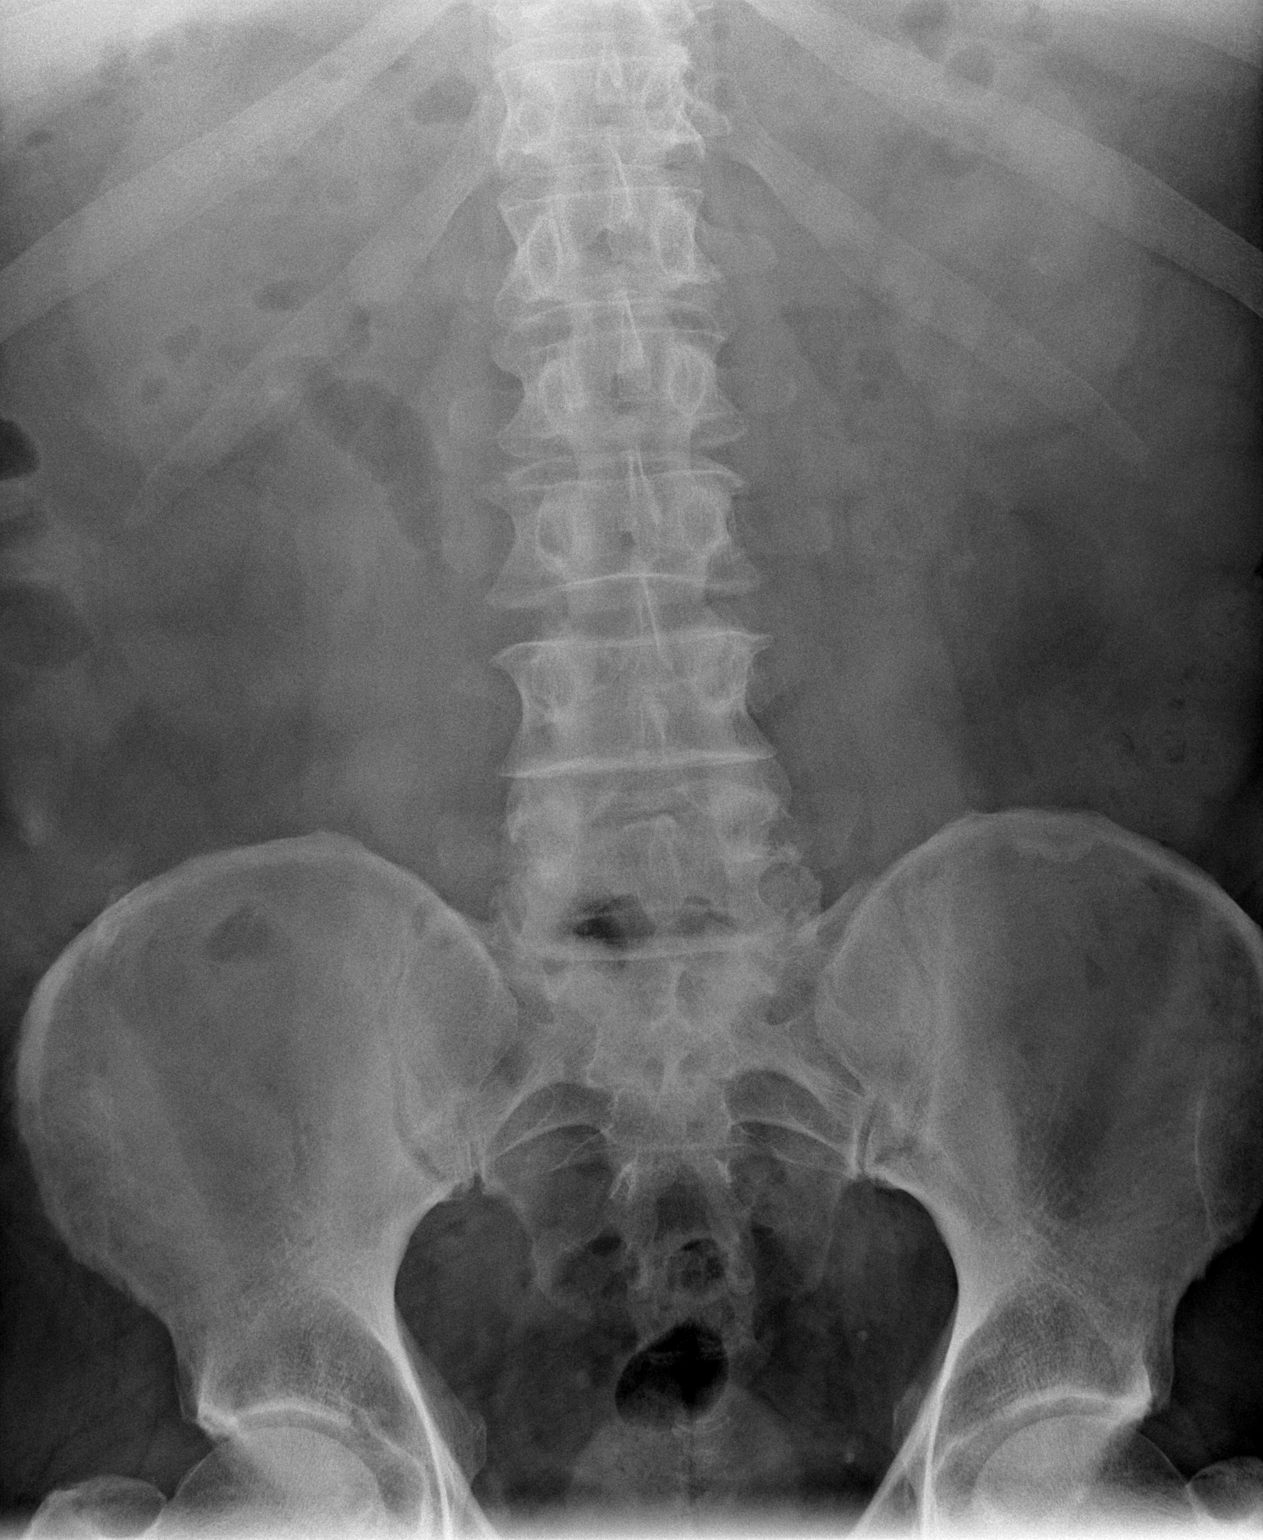

[t ap supine (2 of 2)]
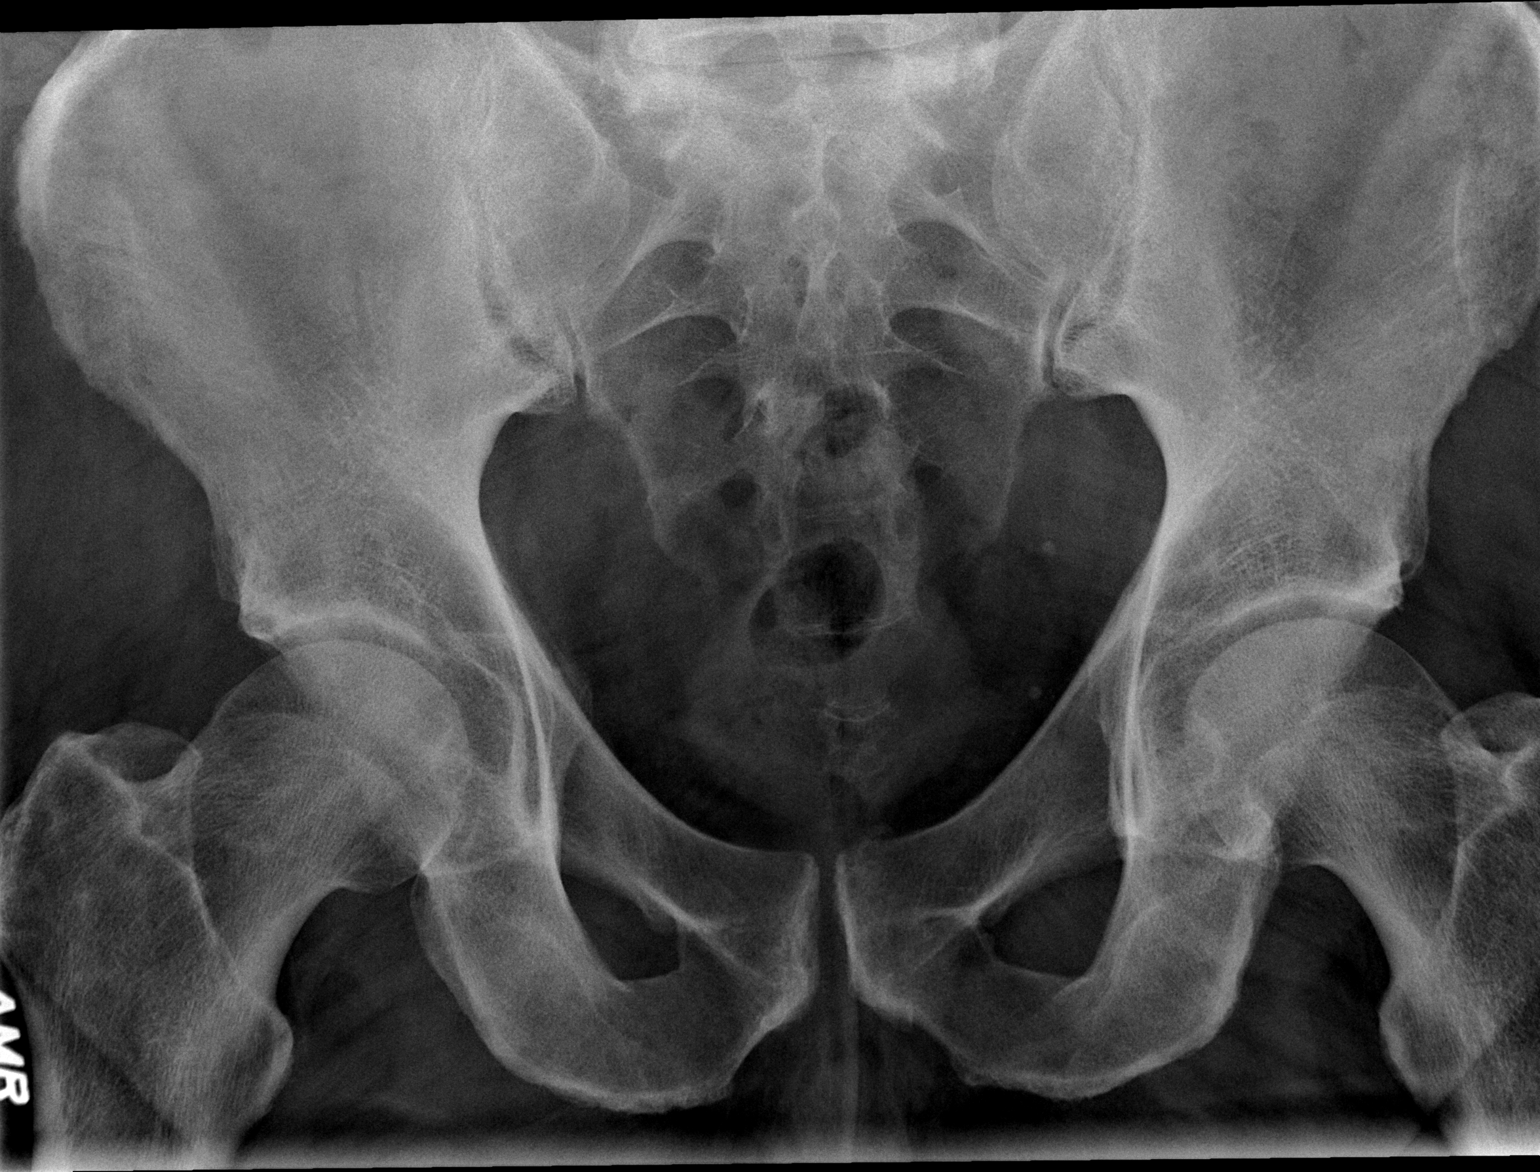

[2 of 2 positions shown; findings below may reference images not displayed]

FINDINGS: 2 supine frontal views of the abdomen and pelvis excludes the
hemidiaphragms and bilateral flanks by collimation. The faint
calcifications projecting over the bilateral renal silhouettes on
prior study are again identified and unchanged. No evidence of
ureteral or bladder calculi. Stable phleboliths. Bowel gas pattern
is unremarkable. No acute bony abnormalities.
IMPRESSION: 1. Stable faint bilateral renal calculi.

## 2022-10-08 ENCOUNTER — Other Ambulatory Visit (INDEPENDENT_AMBULATORY_CARE_PROVIDER_SITE_OTHER): Payer: Medicare Other

## 2022-10-08 ENCOUNTER — Ambulatory Visit: Payer: Medicare Other | Admitting: Orthopedic Surgery

## 2022-10-08 ENCOUNTER — Encounter: Payer: Self-pay | Admitting: Orthopedic Surgery

## 2022-10-08 DIAGNOSIS — M545 Low back pain, unspecified: Secondary | ICD-10-CM

## 2022-10-08 MED ORDER — METHYLPREDNISOLONE 4 MG PO TBPK: ORAL_TABLET | ORAL | 0 refills | Status: DC

## 2022-10-08 MED ORDER — METHOCARBAMOL 500 MG PO TABS: ORAL_TABLET | ORAL | 0 refills | Status: DC

## 2022-10-08 NOTE — Progress Notes (Signed)
Office Visit Note   Patient: Fernando Riley           Date of Birth: [birth year]           MRN: [id number] Visit Date: 10/08/2022 Requested by: [name], DO [address] [place],  [place] [address] PCP: [name], DO  Subjective: No chief complaint on file.   HPI: ALEKSA CATTERTON is a 72 y.o. male who presents to the office reporting some low back pain as well as bilateral lateral leg pain which does not radiate below the knee worse on the right-hand side.  He did do a lot of power washing for 3 days straight.  Reported some mild spasms in the back at that time.  He has a known history of some low back arthritis based on remote radiographs.  Reports some occasional right leg numbness in that anterior thigh region with prolonged standing.  He also describes new great toe numbness on the plantar aspect in both toes which has been going on for several days.  Denies any symptoms below the knees.  Standing more so than walking aggravates his symptoms.  He actually can walk a mile without much difficulty.  Has had bilateral knee replacements.  Advil helps him some.  Reports occasional groin twinges.  Driving the car is okay for several hours.  Sitting on the 0 turn lawnmower is actually worse than driving..                ROS: All systems reviewed are negative as they relate to the chief complaint within the history of present illness.  Patient denies fevers or chills.  Assessment & Plan: Visit Diagnoses:  1. Low back pain, unspecified back pain laterality, unspecified chronicity, unspecified whether sciatica present     Plan: Impression is low back pain and bilateral leg pain.  Could be spinal stenosis or facet arthritis.  He feels a little bit better when he flexes his lumbar spine as opposed to extend it.  That goes along with more facet arthritis mediated symptoms.  Plan is Medrol Dosepak 6-day course with Robaxin.  If that fails to alleviate his symptoms then we could consider MRI  scanning with ESI's to follow.  Low threshold for MRI in this patient.  Follow-Up Instructions: No follow-ups on file.   Orders:  Orders Placed This Encounter  Procedures   XR Lumbar Spine 2-3 Views   XR Pelvis 1-2 Views   No orders of the defined types were placed in this encounter.     Procedures: No procedures performed   Clinical Data: No additional findings.  Objective: Vital Signs: There were no vitals taken for this visit.  Physical Exam:  Constitutional: Patient appears well-developed HEENT:  Head: Normocephalic Eyes:EOM are normal Neck: Normal range of motion Cardiovascular: Normal rate Pulmonary/chest: Effort normal Neurologic: Patient is alert Skin: Skin is warm Psychiatric: Patient has normal mood and affect  Ortho Exam: Ortho exam demonstrates normal gait and alignment.  Has excellent hip abduction adduction and flexion strength.  No nerve root tension signs bilaterally.  Palpable pedal pulses present with no paresthesias on the plantar or dorsal aspect of the foot.  No real pain with forward bending and he does have excellent flexibility bending forward.  He does have mild tenderness over the trochanteric regions bilaterally.  No masses lymphadenopathy or skin changes noted in that back region itself.  Specialty Comments:  No specialty comments available.  Imaging: No results found.  PMFS History: 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   Obstructive sleep apnea 08/20/2015   Essential hypertension, benign 07/20/2013   Past Medical History:  Diagnosis Date   Arthritis    Cat allergies    Fatty liver    History of kidney stones    History of migraine    Hypertension    Impaired fasting glucose    Kidney stone    Melanoma (HCC)    lower  legs bilaterally   OSA (obstructive sleep apnea)    unable to sleep with use of CPAP   Peyronie disease    Pollen allergies    Reactive airways dysfunction syndrome (HCC)    Venous stasis     Family History  Problem Relation Age of Onset   Heart disease Father    Diabetes Brother    Hyperlipidemia Brother     Past Surgical History:  Procedure Laterality Date   COLONOSCOPY N/A 01/21/2016   Procedure: COLONOSCOPY;  Surgeon: Corbin Ade, MD;  Location: [facility];  Service: Endoscopy;  Laterality: N/A;  1:00 PM - moved to 12:30 - office notified per Darlina Rumpf   EXTRACORPOREAL SHOCK WAVE LITHOTRIPSY Left 01/17/2020   Procedure: EXTRACORPOREAL SHOCK WAVE LITHOTRIPSY (ESWL);  Surgeon: Malen Gauze, MD;  Location: [facility];  Service: Urology;  Laterality: Left;   EXTRACORPOREAL SHOCK WAVE LITHOTRIPSY Right 02/14/2020   Procedure: EXTRACORPOREAL SHOCK WAVE LITHOTRIPSY (ESWL);  Surgeon: Malen Gauze, MD;  Location: [facility];  Service: Urology;  Laterality: Right;   EXTRACORPOREAL SHOCK WAVE LITHOTRIPSY Right 01/15/2021   Procedure: EXTRACORPOREAL SHOCK WAVE LITHOTRIPSY (ESWL);  Surgeon: Malen Gauze, MD;  Location: [facility];  Service: Urology;  Laterality: Right;   KNEE ARTHROSCOPY Bilateral    POLYPECTOMY  01/21/2016   Procedure: POLYPECTOMY;  Surgeon: Corbin Ade, MD;  Location: [facility];  Service: Endoscopy;;  colon   SHOULDER ARTHROSCOPY Left    SHOULDER SURGERY Right 2016   TOOTH EXTRACTION     TOTAL KNEE ARTHROPLASTY Right 01/25/2019   Procedure: TOTAL KNEE ARTHROPLASTY;  Surgeon: Durene Romans, MD;  Location: [facility];  Service: Orthopedics;  Laterality: Right;  70 mins   TOTAL KNEE ARTHROPLASTY Left 03/22/2019   Procedure: TOTAL KNEE ARTHROPLASTY;  Surgeon: Durene Romans, MD;  Location: [facility];  Service: Orthopedics;  Laterality: Left;  70 mins   Social History   Occupational History   Not on file  Tobacco Use   Smoking status: Never   Smokeless tobacco:  Never  Vaping Use   Vaping Use: Never used  Substance and Sexual Activity   Alcohol use: No   Drug use: No   Sexual activity: Not on file

## 2022-11-03 ENCOUNTER — Ambulatory Visit (HOSPITAL_COMMUNITY)
Admission: RE | Admit: 2022-11-03 | Discharge: 2022-11-03 | Disposition: A | Discharge status: Home / Self Care | Payer: Medicare Other | Source: Ambulatory Visit | Attending: Urology | Admitting: Urology

## 2022-11-03 ENCOUNTER — Other Ambulatory Visit: Payer: Self-pay

## 2022-11-03 DIAGNOSIS — N200 Calculus of kidney: Secondary | ICD-10-CM

## 2022-11-03 DIAGNOSIS — I878 Other specified disorders of veins: Secondary | ICD-10-CM | POA: Diagnosis not present

## 2022-11-05 ENCOUNTER — Ambulatory Visit: Payer: Medicare Other | Admitting: Urology

## 2022-11-05 VITALS — BP 138/73 | HR 80

## 2022-11-05 DIAGNOSIS — R3912 Poor urinary stream: Secondary | ICD-10-CM

## 2022-11-05 DIAGNOSIS — N200 Calculus of kidney: Secondary | ICD-10-CM | POA: Diagnosis not present

## 2022-11-05 DIAGNOSIS — N401 Enlarged prostate with lower urinary tract symptoms: Secondary | ICD-10-CM

## 2022-11-05 LAB — URINALYSIS, ROUTINE W REFLEX MICROSCOPIC
Bilirubin, UA: NEGATIVE
Glucose, UA: NEGATIVE
Ketones, UA: NEGATIVE
Leukocytes,UA: NEGATIVE
Nitrite, UA: NEGATIVE
Protein,UA: NEGATIVE
RBC, UA: NEGATIVE
Specific Gravity, UA: 1.015 (ref 1.005–1.030)
Urobilinogen, Ur: 0.2 mg/dL (ref 0.2–1.0)
pH, UA: 7 (ref 5.0–7.5)

## 2022-11-05 MED ORDER — TAMSULOSIN HCL 0.4 MG PO CAPS: 0.4000 mg | ORAL_CAPSULE | Freq: Every day | ORAL | 3 refills | Status: DC

## 2022-11-05 NOTE — Progress Notes (Signed)
11/05/2022 8:49 AM   Fernando Riley [birth year] [id number]  Referring provider: [name], DO [address] [place],  [place] [address]  No chief complaint on file.   HPI: Fernando Riley is 72yo here for followup for nephrolithiasis. He has been having left flank pain for several weeks. KUB from today does not show any definitive calculi. No worsening LUTS. UA today normal. IPSS 2 QOl 1 on flomax 0.4mg  daily.  Urine stream strong.   PMH: Past Medical History:  Diagnosis Date   Arthritis    Cat allergies    Fatty liver    History of kidney stones    History of migraine    Hypertension    Impaired fasting glucose    Kidney stone    Melanoma (HCC)    lower legs bilaterally   OSA (obstructive sleep apnea)    unable to sleep with use of CPAP   Peyronie disease    Pollen allergies    Reactive airways dysfunction syndrome (HCC)    Venous stasis     Surgical History: Past Surgical History:  Procedure Laterality Date   COLONOSCOPY N/A 01/21/2016   Procedure: COLONOSCOPY;  Surgeon: Corbin Ade, MD;  [facility];  Service: Endoscopy;  Laterality: N/A;  1:00 PM - moved to 12:30 - office notified per Darlina Rumpf   EXTRACORPOREAL SHOCK WAVE LITHOTRIPSY Left 01/17/2020   Procedure: EXTRACORPOREAL SHOCK WAVE LITHOTRIPSY (ESWL);  Surgeon: Fernando Gauze, MD;  Location: [facility];  Service: Urology;  Laterality: Left;   EXTRACORPOREAL SHOCK WAVE LITHOTRIPSY Right 02/14/2020   Procedure: EXTRACORPOREAL SHOCK WAVE LITHOTRIPSY (ESWL);  Surgeon: Fernando Gauze, MD;  Location: [facility];  Service: Urology;  Laterality: Right;   EXTRACORPOREAL SHOCK WAVE LITHOTRIPSY Right 01/15/2021   Procedure: EXTRACORPOREAL SHOCK WAVE LITHOTRIPSY (ESWL);  Surgeon: Fernando Gauze, MD;  Location: [facility];  Service: Urology;  Laterality: Right;   KNEE ARTHROSCOPY Bilateral    POLYPECTOMY  01/21/2016   Procedure: POLYPECTOMY;  Surgeon: Corbin Ade, MD;  [facility];  Service:  Endoscopy;;  colon   SHOULDER ARTHROSCOPY Left    SHOULDER SURGERY Right 2016   TOOTH EXTRACTION     TOTAL KNEE ARTHROPLASTY Right 01/25/2019   Procedure: TOTAL KNEE ARTHROPLASTY;  Surgeon: Durene Romans, MD;  Location: [facility];  Service: Orthopedics;  Laterality: Right;  70 mins   TOTAL KNEE ARTHROPLASTY Left 03/22/2019   Procedure: TOTAL KNEE ARTHROPLASTY;  Surgeon: Durene Romans, MD;  Location: [facility];  Service: Orthopedics;  Laterality: Left;  70 mins    Home Medications:  Allergies as of 11/05/2022       Reactions   Cat Hair Extract    Other reaction(s): Not available   Food Color Red    Other reaction(s): Not available   Other    Cats   Red Dye Hives   Pt's wife reported he gets hives with "food" red dye, but can take tablets that are red or pink.         Medication List        Accurate as of November 05, 2022  8:49 AM. If you have any questions, ask your nurse or doctor.          acetaminophen 500 MG tablet Commonly known as: TYLENOL Take 500 mg by mouth daily.   enalapril 10 MG tablet Commonly known as: VASOTEC Take 1 tablet (10 mg total) by mouth daily.   methocarbamol 500 MG tablet Commonly known  as: ROBAXIN PO q 8hr prn   methylPREDNISolone 4 MG Tbpk tablet Commonly known as: MEDROL DOSEPAK Take as directed on package.   tamsulosin 0.4 MG Caps capsule Commonly known as: FLOMAX Take 1 capsule (0.4 mg total) by mouth daily.   Turmeric 500 MG Caps Take 1,000 mg by mouth daily.        Allergies:  Allergies  Allergen Reactions   Cat Hair Extract     Other reaction(s): Not available   Food Color Red     Other reaction(s): Not available   Other     Cats   Red Dye Hives    Pt's wife reported he gets hives with "food" red dye, but can take tablets that are red or pink.     Family History: Family History  Problem Relation Age of Onset   Heart disease Father    Diabetes Brother    Hyperlipidemia Brother     Social History:  reports that he  has never smoked. He has never used smokeless tobacco. He reports that he does not drink alcohol and does not use drugs.  ROS: All other review of systems were reviewed and are negative except what is noted above in HPI  Physical Exam: BP 138/73   Pulse 80   Constitutional:  Alert and oriented, No acute distress. HEENT: [place] AT, moist mucus membranes.  Trachea midline, no masses. Cardiovascular: No clubbing, cyanosis, or edema. Respiratory: Normal respiratory effort, no increased work of breathing. GI: Abdomen is soft, nontender, nondistended, no abdominal masses GU: No CVA tenderness.  Lymph: No cervical or inguinal lymphadenopathy. Skin: No rashes, bruises or suspicious lesions. Neurologic: Grossly intact, no focal deficits, moving all 4 extremities. Psychiatric: Normal mood and affect.  Laboratory Data: Lab Results  Component Value Date   WBC 7.8 09/18/2022   HGB 13.8 09/18/2022   HCT 40.6 09/18/2022   MCV 92 09/18/2022   PLT 231 09/18/2022    Lab Results  Component Value Date   CREATININE 1.15 09/18/2022    Lab Results  Component Value Date   PSA 0.2 07/17/2014   PSA 0.31 07/11/2013    No results found for: "TESTOSTERONE"  No results found for: "HGBA1C"  Urinalysis    Component Value Date/Time   APPEARANCEUR Clear 10/21/2021 0924   GLUCOSEU Negative 10/21/2021 0924   BILIRUBINUR Negative 10/21/2021 0924   PROTEINUR Negative 10/21/2021 0924   UROBILINOGEN negative (A) 06/14/2019 1549   NITRITE Negative 10/21/2021 0924   LEUKOCYTESUR Negative 10/21/2021 0924    Lab Results  Component Value Date   LABMICR 3.6 09/18/2022   WBCUA None seen 04/12/2021   LABEPIT None seen 04/12/2021   MUCUS Present 04/12/2021   BACTERIA Few (A) 04/12/2021    Pertinent Imaging: KUB 11/03/2022: Images reviewed and discussed with the patient  Results for orders placed during the hospital encounter of 10/19/21  Abdomen 1 view (KUB)  Narrative CLINICAL DATA:  Follow-up  renal stones.  No pain.  EXAM: ABDOMEN - 1 VIEW  COMPARISON:  April 01, 2021  FINDINGS: Both kidneys are partially obscured by bowel contents but no renal stones are identified. No ureteral stones noted. Calcifications in the left pelvis are stable, consistent with phleboliths. No other abnormalities.  IMPRESSION: No renal or ureteral stones identified.   Electronically Signed By: Gerome Sam III M.D. On: 10/20/2021 10:39  No results found for this or any previous visit.  No results found for this or any previous visit.  No results found for this  or any previous visit.  Results for orders placed during the hospital encounter of 03/25/21  US RENAL  Narrative CLINICAL DATA:  History of nephrolithiasis. History of prior lithotripsy.  EXAM: RENAL / URINARY TRACT ULTRASOUND COMPLETE  COMPARISON:  Abdomen 01/31/2021. CT 01/09/2020. Ultrasound 11/07/2008.  FINDINGS: Right Kidney:  Renal measurements: 12.9 x 6.4 x 6.9 cm = volume: 264 mL. Echogenicity within normal limits. No mass or hydronephrosis visualized. 6 mm echogenicity consistent with nonobstructing calyceal stone versus vascular calcification.  Left Kidney:  Renal measurements: 12.9 x 6.1 x 6.4 cm = volume: 264 mL. Echogenicity within normal limits. No mass or hydronephrosis visualized. 5 mm echogenicity consistent with nonobstructing calyceal stone versus vascular calcification.  Bladder:  Appears normal for degree of bladder distention.  Other:  None.  IMPRESSION: 1. Single tiny echogenicity noted in both kidneys consistent with tiny nonobstructing stone or vascular calcification. No hydronephrosis. 2. No acute abnormality.   Electronically Signed By: Maisie Fus  Register M.D. On: 03/26/2021 06:16  No valid procedures specified. No results found for this or any previous visit.  Results for orders placed during the hospital encounter of 01/09/20  CT RENAL STONE  STUDY  Narrative CLINICAL DATA:  Left-sided flank pain for several days  EXAM: CT ABDOMEN AND PELVIS WITHOUT CONTRAST  TECHNIQUE: Multidetector CT imaging of the abdomen and pelvis was performed following the standard protocol without IV contrast.  COMPARISON:  12/09/2007  FINDINGS: Lower chest: No acute abnormality.  Hepatobiliary: No focal liver abnormality is seen. No gallstones, gallbladder wall thickening, or biliary dilatation.  Pancreas: Unremarkable. No pancreatic ductal dilatation or surrounding inflammatory changes.  Spleen: Normal in size without focal abnormality.  Adrenals/Urinary Tract: Adrenal glands are within normal limits. Kidneys are well visualized bilaterally with bilateral renal calculi increased in size and number when compared with the prior exam. The largest of these on the right measures 4 mm in the upper pole. Largest of these on the left measures 6 mm in the midportion of the left kidney. Mild fullness of the collecting system and left ureter is seen. Some Peri ureteral stranding is noted. Two distal left ureteral stones are noted. The dominant stone measures approximately 6 mm. The smaller adjacent stone measures 1-2 mm. This is best visualized on image number 81 of series 6. The bladder is decompressed.  Stomach/Bowel: The appendix is within normal limits. Colon shows no obstructive or inflammatory changes. Small bowel and stomach appear within normal limits as well.  Vascular/Lymphatic: Aortic atherosclerosis. No enlarged abdominal or pelvic lymph nodes.  Reproductive: Prostate is unremarkable.  Other: No abdominal wall hernia or abnormality. No abdominopelvic ascites.  Musculoskeletal: No acute or significant osseous findings.  IMPRESSION: Two distal left ureteral stones as described above causing mild hydronephrosis and hydroureter.  Bilateral nonobstructing renal stones as described.   Electronically Signed By: Alcide Clever  M.D. On: 01/09/2020 09:44   Assessment & Plan:    1. Nephrolithiasis -followup 1 year with KUB - Urinalysis, Routine w reflex microscopic  2. BPH with weak stream -continue flomax 0.4mg  daily   No follow-ups on file.  Wilkie Aye, MD  [facility] [place]

## 2022-11-11 ENCOUNTER — Encounter: Payer: Self-pay | Admitting: Urology

## 2022-11-11 NOTE — Patient Instructions (Signed)

## 2023-01-07 ENCOUNTER — Ambulatory Visit
Admission: EM | Admit: 2023-01-07 | Discharge: 2023-01-07 | Disposition: A | Discharge status: Home / Self Care | Payer: Medicare Other

## 2023-01-07 DIAGNOSIS — H02826 Cysts of left eye, unspecified eyelid: Secondary | ICD-10-CM

## 2023-01-07 MED ORDER — ERYTHROMYCIN 5 MG/GM OP OINT: TOPICAL_OINTMENT | OPHTHALMIC | 0 refills | Status: DC

## 2023-01-07 NOTE — ED Provider Notes (Signed)
[facility]    CSN: [id number] Arrival date & time: 01/07/23  1427      History   Chief Complaint No chief complaint on file.   HPI Fernando Riley is a 72 y.o. male.   Patient presenting today with a red swollen place to the left lower eyelid that has been present for a month or so.  Has been on doxycycline for several weeks for this with no improvement.  Denies drainage, known injury to the area, fever, chills, visual change and states pain is about a 2 out of 10.  Ophthalmologist is booked out 3 weeks for an appointment.    Past Medical History:  Diagnosis Date   Arthritis    Cat allergies    Fatty liver    History of kidney stones    History of migraine    Hypertension    Impaired fasting glucose    Kidney stone    Melanoma (HCC)    lower legs bilaterally   OSA (obstructive sleep apnea)    unable to sleep with use of CPAP   Peyronie disease    Pollen allergies    Reactive airways dysfunction syndrome (HCC)    Venous stasis     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   Obstructive sleep apnea 08/20/2015   Essential hypertension, benign 07/20/2013    Past Surgical History:  Procedure Laterality Date   COLONOSCOPY N/A 01/21/2016   Procedure: COLONOSCOPY;  Surgeon: Corbin Ade, MD;  [facility];  Service: Endoscopy;  Laterality: N/A;  1:00 PM - moved to 12:30 - office notified per Darlina Rumpf   EXTRACORPOREAL SHOCK WAVE LITHOTRIPSY Left 01/17/2020   Procedure: EXTRACORPOREAL SHOCK WAVE LITHOTRIPSY (ESWL);  Surgeon: Malen Gauze, MD;  Location: [facility];  Service: Urology;  Laterality: Left;   EXTRACORPOREAL SHOCK WAVE LITHOTRIPSY Right 02/14/2020   Procedure: EXTRACORPOREAL SHOCK WAVE LITHOTRIPSY (ESWL);  Surgeon: Malen Gauze, MD;  Location: [facility];  Service: Urology;  Laterality: Right;   EXTRACORPOREAL SHOCK WAVE LITHOTRIPSY Right 01/15/2021   Procedure: EXTRACORPOREAL SHOCK WAVE LITHOTRIPSY (ESWL);  Surgeon: Malen Gauze, MD;  Location: [facility];  Service: Urology;  Laterality: Right;   KNEE ARTHROSCOPY Bilateral    POLYPECTOMY  01/21/2016   Procedure: POLYPECTOMY;  Surgeon: Corbin Ade, MD;  [facility];  Service: Endoscopy;;  colon   SHOULDER ARTHROSCOPY Left    SHOULDER SURGERY Right 2016   TOOTH EXTRACTION     TOTAL KNEE ARTHROPLASTY Right 01/25/2019   Procedure: TOTAL KNEE ARTHROPLASTY;  Surgeon: Durene Romans, MD;  [facility];  Service: Orthopedics;  Laterality: Right;  70 mins   TOTAL KNEE ARTHROPLASTY Left 03/22/2019   Procedure: TOTAL KNEE ARTHROPLASTY;  Surgeon: Durene Romans, MD;  [facility];  Service: Orthopedics;  Laterality: Left;  70 mins       Home Medications    Prior to Admission medications   Medication Sig Start Date End Date Taking? Authorizing Provider  doxycycline (ADOXA) 50 MG tablet Take 50 mg by mouth 2 (two) times daily.   Yes [provider]  erythromycin ophthalmic ointment Place a 1/2 inch ribbon of ointment into the left lower eyelid BID prn. 01/07/23  Yes Particia Nearing, PA-C  acetaminophen (TYLENOL) 500 MG tablet Take 500 mg  by mouth daily.    [provider]  enalapril (VASOTEC) 10 MG tablet Take 1 tablet (10 mg total) by mouth daily. 09/18/22   Fernando Sams, DO  methocarbamol (ROBAXIN) 500 MG tablet PO q 8hr prn 10/08/22   Fernando Copa, MD  methylPREDNISolone (MEDROL DOSEPAK) 4 MG TBPK tablet Take as directed on package. 10/08/22   Fernando Copa, MD  tamsulosin (FLOMAX) 0.4 MG CAPS capsule Take 1 capsule (0.4 mg total) by mouth daily. 11/05/22   Fernando, Mardene Celeste, MD  Turmeric 500 MG CAPS Take 1,000 mg by mouth daily.     [provider]    Family History Family History  Problem Relation Age  of Onset   Heart disease Father    Diabetes Brother    Hyperlipidemia Brother     Social History Social History   Tobacco Use   Smoking status: Never   Smokeless tobacco: Never  Vaping Use   Vaping status: Never Used  Substance Use Topics   Alcohol use: No   Drug use: No     Allergies   Cat hair extract, Food color red, Other, and Red dye #40 (allura red)   Review of Systems Review of Systems Per HPI  Physical Exam Triage Vital Signs ED Triage Vitals  Encounter Vitals Group     BP 01/07/23 1459 (!) 145/86     Systolic BP Percentile --      Diastolic BP Percentile --      Pulse Rate 01/07/23 1459 62     Resp 01/07/23 1459 15     Temp 01/07/23 1459 98.5 F (36.9 C)     Temp Source 01/07/23 1459 Oral     SpO2 01/07/23 1459 96 %     Weight --      Height --      Head Circumference --      Peak Flow --      Pain Score 01/07/23 1500 2     Pain Loc --      Pain Education --      Exclude from Growth Chart --    No data found.  Updated Vital Signs BP (!) 145/86 (BP Location: Right Arm)   Pulse 62   Temp 98.5 F (36.9 C) (Oral)   Resp 15   SpO2 96%   Visual Acuity Right Eye Distance:   Left Eye Distance:   Bilateral Distance:    Right Eye Near:   Left Eye Near:    Bilateral Near:     Physical Exam Vitals and nursing note reviewed.  Constitutional:      Appearance: Normal appearance.  HENT:     Head: Atraumatic.     Mouth/Throat:     Mouth: Mucous membranes are moist.     Pharynx: Oropharynx is clear.  Eyes:     Extraocular Movements: Extraocular movements intact.     Conjunctiva/sclera: Conjunctivae normal.     Pupils: Pupils are equal, round, and reactive to light.     Comments: Firm erythematous mass to the lower left eyelid, minimally tender to palpation, no fluctuance or induration, no active drainage.  Conjunctiva benign  Cardiovascular:     Rate and Rhythm: Normal rate and regular rhythm.  Pulmonary:     Effort: Pulmonary effort is  normal.     Breath sounds: Normal breath sounds.  Musculoskeletal:        General: Normal range of motion.     Cervical back: Normal range of motion  and neck supple.  Skin:    General: Skin is warm and dry.  Neurological:     General: No focal deficit present.     Mental Status: He is oriented to person, place, and time.  Psychiatric:        Mood and Affect: Mood normal.        Thought Content: Thought content normal.        Judgment: Judgment normal.    UC Treatments / Results  Labs (all labs ordered are listed, but only abnormal results are displayed) Labs Reviewed - No data to display  EKG   Radiology No results found.  Procedures Procedures (including critical care time)  Medications Ordered in UC Medications - No data to display  Initial Impression / Assessment and Plan / UC Course  I have reviewed the triage vital signs and the nursing notes.  Pertinent labs & imaging results that were available during my care of the patient were reviewed by me and considered in my medical decision making (see chart for details).     Not responding to multiple weeks of doxycycline, will trial topical therapy with erythromycin ointment, warm compresses and ophthalmology follow-up.  Return for worsening symptoms. Final Clinical Impressions(s) / UC Diagnoses   Final diagnoses:  Eyelid cyst, left     Discharge Instructions      Continue warm compresses, use the erythromycin ointment twice daily until resolved.  Follow-up with ophthalmology    ED Prescriptions     Medication Sig Dispense Auth. Provider   erythromycin ophthalmic ointment Place a 1/2 inch ribbon of ointment into the left lower eyelid BID prn. 3.5 g Particia Nearing, PA-C      PDMP not reviewed this encounter.   Particia Riley, [place] 01/07/23 [phone]

## 2023-01-07 NOTE — Discharge Instructions (Signed)
Continue warm compresses, use the erythromycin ointment twice daily until resolved.  Follow-up with ophthalmology

## 2023-01-07 NOTE — ED Triage Notes (Addendum)
Pt c/o left eye problem, there is a small knot on the left lower eye lid, does not affect vision. Pt is currently taking doxycycline

## 2023-01-15 DIAGNOSIS — H00015 Hordeolum externum left lower eyelid: Secondary | ICD-10-CM | POA: Diagnosis not present

## 2023-01-15 DIAGNOSIS — H0102B Squamous blepharitis left eye, upper and lower eyelids: Secondary | ICD-10-CM | POA: Diagnosis not present

## 2023-01-23 ENCOUNTER — Ambulatory Visit (INDEPENDENT_AMBULATORY_CARE_PROVIDER_SITE_OTHER): Payer: Medicare Other

## 2023-01-23 VITALS — Ht 72.0 in | Wt 265.0 lb

## 2023-01-23 DIAGNOSIS — Z0000 Encounter for general adult medical examination without abnormal findings: Secondary | ICD-10-CM | POA: Diagnosis not present

## 2023-01-23 NOTE — Progress Notes (Signed)
Subjective:   Fernando Riley is a 72 y.o. male who presents for Medicare Annual/Subsequent preventive examination.  Visit Complete: Virtual I connected with  Malen Gauze on 01/23/23 by a audio enabled telemedicine application and verified that I am speaking with the correct person using two identifiers.  Patient Location: Home  Provider Location: Home Office  I discussed the limitations of evaluation and management by telemedicine. The patient expressed understanding and agreed to proceed.  Vital Signs: Because this visit was a virtual/telehealth visit, some criteria may be missing or patient reported. Any vitals not documented were not able to be obtained and vitals that have been documented are patient reported.  Cardiac Risk Factors include: advanced age (>69men, >67 women);male gender;hypertension     Objective:    Today's Vitals   01/23/23 0839  Weight: 265 lb (120.2 kg)  Height: 6' (1.829 m)   Body mass index is 35.94 kg/m.     01/23/2023    9:05 AM 01/15/2022    8:49 AM 01/15/2021    6:55 AM 09/04/2020    8:27 AM 02/14/2020    7:14 AM 02/07/2020   10:49 AM 03/22/2019   11:28 AM  Advanced Directives  Does Patient Have a Medical Advance Directive? No No No Yes Yes Yes Yes  Type of Theme park manager;Living will Healthcare Power of [place];Living will Healthcare Power of [place];Living will Healthcare Power of [place];Living will  Does patient want to make changes to medical advance directive?    No - Patient declined No - Patient declined No - Patient declined No - Patient declined  Copy of Healthcare Power of Attorney in Chart?    No - copy requested No - copy requested No - copy requested No - copy requested  Would patient like information on creating a medical advance directive? Yes (MAU/Ambulatory/Procedural Areas - Information given) No - Patient declined No - Patient declined        Current Medications (verified) Outpatient  Encounter Medications as of 01/23/2023  Medication Sig   acetaminophen (TYLENOL) 500 MG tablet Take 500 mg by mouth daily.   enalapril (VASOTEC) 10 MG tablet Take 1 tablet (10 mg total) by mouth daily.   erythromycin ophthalmic ointment Place a 1/2 inch ribbon of ointment into the left lower eyelid BID prn.   methocarbamol (ROBAXIN) 500 MG tablet PO q 8hr prn   tamsulosin (FLOMAX) 0.4 MG CAPS capsule Take 1 capsule (0.4 mg total) by mouth daily.   Turmeric 500 MG CAPS Take 1,000 mg by mouth daily.    [DISCONTINUED] doxycycline (ADOXA) 50 MG tablet Take 50 mg by mouth 2 (two) times daily.   [DISCONTINUED] methylPREDNISolone (MEDROL DOSEPAK) 4 MG TBPK tablet Take as directed on package.   No facility-administered encounter medications on file as of 01/23/2023.    Allergies (verified) Cat hair extract, Food color red, Other, and Red dye #40 (allura red)   History: Past Medical History:  Diagnosis Date   Arthritis    Cat allergies    Fatty liver    History of kidney stones    History of migraine    Hypertension    Impaired fasting glucose    Kidney stone    Melanoma (HCC)    lower legs bilaterally   OSA (obstructive sleep apnea)    unable to sleep with use of CPAP   Peyronie disease    Pollen allergies    Reactive airways dysfunction syndrome (HCC)  Venous stasis    Past Surgical History:  Procedure Laterality Date   COLONOSCOPY N/A 01/21/2016   Procedure: COLONOSCOPY;  Surgeon: Corbin Ade, MD;  [facility];  Service: Endoscopy;  Laterality: N/A;  1:00 PM - moved to 12:30 - office notified per Darlina Rumpf   EXTRACORPOREAL SHOCK WAVE LITHOTRIPSY Left 01/17/2020   Procedure: EXTRACORPOREAL SHOCK WAVE LITHOTRIPSY (ESWL);  Surgeon: Malen Gauze, MD;  Location: [facility];  Service: Urology;  Laterality: Left;   EXTRACORPOREAL SHOCK WAVE LITHOTRIPSY Right 02/14/2020   Procedure: EXTRACORPOREAL SHOCK WAVE LITHOTRIPSY (ESWL);  Surgeon: Malen Gauze, MD;   Location: [facility];  Service: Urology;  Laterality: Right;   EXTRACORPOREAL SHOCK WAVE LITHOTRIPSY Right 01/15/2021   Procedure: EXTRACORPOREAL SHOCK WAVE LITHOTRIPSY (ESWL);  Surgeon: Malen Gauze, MD;  Location: [facility];  Service: Urology;  Laterality: Right;   KNEE ARTHROSCOPY Bilateral    POLYPECTOMY  01/21/2016   Procedure: POLYPECTOMY;  Surgeon: Corbin Ade, MD;  Location: [facility];  Service: Endoscopy;;  colon   SHOULDER ARTHROSCOPY Left    SHOULDER SURGERY Right 2016   TOOTH EXTRACTION     TOTAL KNEE ARTHROPLASTY Right 01/25/2019   Procedure: TOTAL KNEE ARTHROPLASTY;  Surgeon: Durene Romans, MD;  Location: [facility];  Service: Orthopedics;  Laterality: Right;  70 mins   TOTAL KNEE ARTHROPLASTY Left 03/22/2019   Procedure: TOTAL KNEE ARTHROPLASTY;  Surgeon: Durene Romans, MD;  Location: [facility];  Service: Orthopedics;  Laterality: Left;  70 mins   Family History  Problem Relation Age of Onset   Heart disease Father    Diabetes Brother    Hyperlipidemia Brother    Social History   Socioeconomic History   Marital status: Married    Spouse name: Not on file   Number of children: Not on file   Years of education: Not on file   Highest education level: Not on file  Occupational History   Not on file  Tobacco Use   Smoking status: Never   Smokeless tobacco: Never  Vaping Use   Vaping status: Never Used  Substance and Sexual Activity   Alcohol use: No   Drug use: No   Sexual activity: Not on file  Other Topics Concern   Not on file  Social History Narrative   Not on file   Social Determinants of Health   Financial Resource Strain: Low Risk  (01/23/2023)   Overall Financial Resource Strain (CARDIA)    Difficulty of Paying Living Expenses: Not hard at all  Food Insecurity: No Food Insecurity (01/23/2023)   Hunger Vital Sign    Worried About Running Out of Food in the Last Year: Never true    Ran Out of Food in the Last Year: Never true  Transportation Needs: No  Transportation Needs (01/23/2023)   PRAPARE - Administrator, Civil Service (Medical): No    Lack of Transportation (Non-Medical): No  Physical Activity: Insufficiently Active (01/23/2023)   Exercise Vital Sign    Days of Exercise per Week: 3 days    Minutes of Exercise per Session: 30 min  Stress: No Stress Concern Present (01/23/2023)   [employer] of Occupational Health - Occupational Stress Questionnaire    Feeling of Stress : Not at all  Social Connections: Moderately Integrated (01/23/2023)   Social Connection and Isolation Panel [NHANES]    Frequency of Communication with Friends and Family: More than three times a week    Frequency of Social Gatherings with Friends  and Family: Three times a week    Attends Religious Services: More than 4 times per year    Active Member of Clubs or Organizations: No    Attends Banker Meetings: Never    Marital Status: Married    Tobacco Counseling Counseling given: Not Answered   Clinical Intake:  Pre-visit preparation completed: Yes  Pain : No/denies pain     Diabetes: No  How often do you need to have someone help you when you read instructions, pamphlets, or other written materials from your doctor or pharmacy?: 1 - Never  Interpreter Needed?: No  Information entered by :: Kandis Fantasia LPN   Activities of Daily Living    01/23/2023    9:02 AM  In your present state of health, do you have any difficulty performing the following activities:  Hearing? 0  Vision? 0  Difficulty concentrating or making decisions? 0  Walking or climbing stairs? 0  Dressing or bathing? 0  Doing errands, shopping? 0  Preparing Food and eating ? N  Using the Toilet? N  In the past six months, have you accidently leaked urine? N  Do you have problems with loss of bowel control? N  Managing your Medications? N  Managing your Finances? N  Housekeeping or managing your Housekeeping? N    Patient Care  Team: [name], DO as PCP - General (Family Medicine) Rodrigo Ran, [place] (Ophthalmology) Ronne Binning Mardene Celeste, MD as Consulting Physician (Urology)  Indicate any recent Medical Services you may have received from other than [facility] providers in the past year (date may be approximate).     Assessment:   This is a routine wellness examination for [place].  Hearing/Vision screen Hearing Screening - Comments:: Denies hearing difficulties   Vision Screening - Comments:: Wears rx glasses - up to date with routine eye exams with Dr. Parke Simmers     Goals Addressed   None   Depression Screen    01/23/2023    9:04 AM 09/18/2022    8:32 AM 03/19/2022    8:26 AM 01/15/2022    8:47 AM 09/17/2021    8:39 AM 09/04/2020    8:30 AM 08/15/2020   10:09 AM  PHQ 2/9 Scores  PHQ - 2 Score 0 0 0 0 0 0 0  PHQ- 9 Score  0  0       Fall Risk    01/23/2023    9:05 AM 09/18/2022    8:32 AM 03/19/2022    8:25 AM 01/15/2022    8:50 AM 09/17/2021    8:39 AM  Fall Risk   Falls in the past year? 0 0 0 0 0  Number falls in past yr: 0 0 0 0 0  Injury with Fall? 0 0 0 0 0  Risk for fall due to : No Fall Risks No Fall Risks No Fall Risks No Fall Risks No Fall Risks  Follow up Falls prevention discussed;Education provided;Falls evaluation completed Falls evaluation completed Falls evaluation completed Falls prevention discussed;Falls evaluation completed Falls evaluation completed    MEDICARE RISK AT HOME: Medicare Risk at Home Any stairs in or around the home?: No If so, are there any without handrails?: No Home free of loose throw rugs in walkways, pet beds, electrical cords, etc?: Yes Adequate lighting in your home to reduce risk of falls?: Yes Life alert?: No Use of a cane, walker or w/c?: No Grab bars in the bathroom?: No Shower chair or bench in shower?: No Elevated  toilet seat or a handicapped toilet?: Yes  TIMED UP AND GO:  Was the test performed?  No    Cognitive Function:         01/23/2023    9:06 AM 01/15/2022    8:51 AM  6CIT Screen  What Year? 0 points 0 points  What month? 0 points 0 points  What time? 0 points 0 points  Count back from 20 0 points 0 points  Months in reverse 0 points 0 points  Repeat phrase 0 points 0 points  Total Score 0 points 0 points    Immunizations Immunization History  Administered Date(s) Administered   Influenza,inj,Quad PF,6+ Mos 01/12/2017, 01/16/2018, 01/12/2019   Influenza,inj,quad, With Preservative 01/16/2018   Influenza-Unspecified 01/28/2016, 01/12/2017, 01/18/2020, 01/12/2021   Pneumococcal Conjugate-13 01/12/2019   Pneumococcal Polysaccharide-23 09/14/2017   Td 02/08/2008   Zoster Recombinant(Shingrix) 06/16/2016    TDAP status: Due, Education has been provided regarding the importance of this vaccine. Advised may receive this vaccine at local pharmacy or Health Dept. Aware to provide a copy of the vaccination record if obtained from local pharmacy or Health Dept. Verbalized acceptance and understanding.  Flu Vaccine status: Due, Education has been provided regarding the importance of this vaccine. Advised may receive this vaccine at local pharmacy or Health Dept. Aware to provide a copy of the vaccination record if obtained from local pharmacy or Health Dept. Verbalized acceptance and understanding.  Pneumococcal vaccine status: Up to date  Covid-19 vaccine status: Information provided on how to obtain vaccines.   Qualifies for Shingles Vaccine? Yes   Zostavax completed No   Shingrix Completed?: No.    Education has been provided regarding the importance of this vaccine. Patient has been advised to call insurance company to determine out of pocket expense if they have not yet received this vaccine. Advised may also receive vaccine at local pharmacy or Health Dept. Verbalized acceptance and understanding.  Screening Tests Health Maintenance  Topic Date Due   DTaP/Tdap/Td (2 - Tdap) 02/07/2018   INFLUENZA  VACCINE  11/13/2022   Zoster Vaccines- Shingrix (2 of 2) 09/09/2023 (Originally 08/11/2016)   Medicare Annual Wellness (AWV)  01/23/2024   Colonoscopy  01/20/2026   Pneumonia Vaccine 38+ Years old  Completed   HPV VACCINES  Aged Out   COVID-19 Vaccine  Discontinued   Hepatitis C Screening  Discontinued    Health Maintenance  Health Maintenance Due  Topic Date Due   DTaP/Tdap/Td (2 - Tdap) 02/07/2018   INFLUENZA VACCINE  11/13/2022    Colorectal cancer screening: Type of screening: Colonoscopy. Completed 01/21/16. Repeat every 10 years  Lung Cancer Screening: (Low Dose CT Chest recommended if Age 33-80 years, 20 pack-year currently smoking OR have quit w/in 15years.) does not qualify.   Lung Cancer Screening Referral: n/a  Additional Screening:  Hepatitis C Screening: does qualify  Vision Screening: Recommended annual ophthalmology exams for early detection of glaucoma and other disorders of the eye. Is the patient up to date with their annual eye exam?  Yes  Who is the provider or what is the name of the office in which the patient attends annual eye exams? Dr. Parke Simmers  If pt is not established with a provider, would they like to be referred to a provider to establish care? No .   Dental Screening: Recommended annual dental exams for proper oral hygiene  Community Resource Referral / Chronic Care Management: CRR required this visit?  No   CCM required this visit?  No  Plan:     I have personally reviewed and noted the following in the patient's chart:   Medical and social history Use of alcohol, tobacco or illicit drugs  Current medications and supplements including opioid prescriptions. Patient is not currently taking opioid prescriptions. Functional ability and status Nutritional status Physical activity Advanced directives List of other physicians Hospitalizations, surgeries, and ER visits in previous 12 months Vitals Screenings to include cognitive,  depression, and falls Referrals and appointments  In addition, I have reviewed and discussed with patient certain preventive protocols, quality metrics, and best practice recommendations. A written personalized care plan for preventive services as well as general preventive health recommendations were provided to patient.     Kandis Fantasia [place], [place]   [id number]/01/[id number]   After Visit Summary: (MyChart) Due to this being a telephonic visit, the after visit summary with patients personalized plan was offered to patient via MyChart   Nurse Notes: No concerns at this time

## 2023-01-23 NOTE — Patient Instructions (Signed)
Fernando Riley , Thank you for taking time to come for your Medicare Wellness Visit. I appreciate your ongoing commitment to your health goals. Please review the following plan we discussed and let me know if I can assist you in the future.   Referrals/Orders/Follow-Ups/Clinician Recommendations: Aim for 30 minutes of exercise or brisk walking, 6-8 glasses of water, and 5 servings of fruits and vegetables each day.  This is a list of the screening recommended for you and due dates:  Health Maintenance  Topic Date Due   DTaP/Tdap/Td vaccine (2 - Tdap) 02/07/2018   Flu Shot  11/13/2022   Zoster (Shingles) Vaccine (2 of 2) 09/09/2023*   Medicare Annual Wellness Visit  01/23/2024   Colon Cancer Screening  01/20/2026   Pneumonia Vaccine  Completed   HPV Vaccine  Aged Out   COVID-19 Vaccine  Discontinued   Hepatitis C Screening  Discontinued  *Topic was postponed. The date shown is not the original due date.    Advanced directives: (ACP Link)Information on Advanced Care Planning can be found at [facility] [place] [place] Advance Health Care Directives Advance Health Care Directives ([email or web address]   Next Medicare Annual Wellness Visit scheduled for next year: Yes

## 2023-03-06 DIAGNOSIS — H019 Unspecified inflammation of eyelid: Secondary | ICD-10-CM | POA: Diagnosis not present

## 2023-03-06 DIAGNOSIS — H0015 Chalazion left lower eyelid: Secondary | ICD-10-CM | POA: Diagnosis not present

## 2023-03-09 ENCOUNTER — Ambulatory Visit: Payer: Medicare Other | Admitting: Family Medicine

## 2023-03-10 ENCOUNTER — Ambulatory Visit: Payer: Medicare Other | Admitting: Family Medicine

## 2023-03-11 ENCOUNTER — Ambulatory Visit: Payer: Medicare Other | Admitting: Family Medicine

## 2023-03-16 ENCOUNTER — Ambulatory Visit (INDEPENDENT_AMBULATORY_CARE_PROVIDER_SITE_OTHER): Payer: Medicare Other | Admitting: Family Medicine

## 2023-03-16 ENCOUNTER — Encounter: Payer: Self-pay | Admitting: Family Medicine

## 2023-03-16 VITALS — BP 122/74 | HR 76 | Temp 98.1°F | Ht 72.0 in | Wt 263.0 lb

## 2023-03-16 DIAGNOSIS — G4733 Obstructive sleep apnea (adult) (pediatric): Secondary | ICD-10-CM

## 2023-03-16 NOTE — Patient Instructions (Signed)
I will send this in.   If we need a new sleep study we will let you know.  Take care  Dr. Adriana Simas

## 2023-03-16 NOTE — Progress Notes (Signed)
Subjective:  Patient ID: Fernando Riley, male    DOB: [birth year]  Age: 72 y.o. MRN: [id number]  CC:   Chief Complaint  Patient presents with   replacement for CPAP    Last sleep study done via [place] - home test 2017 Fax new order to [facility] - and copy to patient    HPI:  72 year old male with obstructive sleep apnea presents for evaluation of the above.  Patient states that his machine is no longer working.  He states that he began to leak and subsequently gave out/quit working.  Up to this point, he was compliant with therapy.  He needs a new CPAP machine as well as supplies.  He would like me to send this to [facility].  Last sleep study was 2017.   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   Obstructive sleep apnea 08/20/2015   Essential hypertension, benign 07/20/2013    Social Hx   Social History   Socioeconomic History   Marital status: Married    Spouse name: Not on file   Number of children: Not on file   Years of education: Not on file   Highest education level: Some college, no degree  Occupational History   Not on file  Tobacco Use   Smoking status: Never   Smokeless tobacco: Never  Vaping Use   Vaping status: Never Used  Substance and Sexual Activity   Alcohol use: No   Drug use: No   Sexual activity: Not on file  Other Topics Concern   Not on file  Social History Narrative   Not on file   Social Determinants of Health   Financial Resource Strain: Medium Risk (03/15/2023)   Overall Financial Resource Strain (CARDIA)    Difficulty of Paying Living Expenses: Somewhat hard  Food Insecurity: No Food Insecurity (03/15/2023)   Hunger Vital Sign    Worried About Running Out of Food in the Last Year: Never true    Ran Out of  Food in the Last Year: Never true  Transportation Needs: No Transportation Needs (03/15/2023)   PRAPARE - Administrator, Civil Service (Medical): No    Lack of Transportation (Non-Medical): No  Physical Activity: Insufficiently Active (03/15/2023)   Exercise Vital Sign    Days of Exercise per Week: 3 days    Minutes of Exercise per Session: 20 min  Stress: No Stress Concern Present (03/15/2023)   [employer] of Occupational Health - Occupational Stress Questionnaire    Feeling of Stress : Not at all  Social Connections: Socially Integrated (03/15/2023)   Social Connection and Isolation Panel [NHANES]    Frequency of Communication with Friends and Family: More than three times a week    Frequency of Social Gatherings with Friends and Family: More than three times a week    Attends Religious Services: More than 4 times per year    Active Member of [employer] or Organizations: Yes    Attends Engineer, structural: More than 4 times per year    Marital Status: Married    Review of Systems  Cardiovascular: Negative.    Objective:  BP 122/74   Pulse 76   Temp 98.1 F (36.7 C)   Ht 6' (1.829 m)   Wt 263 lb (119.3 kg)  SpO2 98%   BMI 35.67 kg/m      03/16/2023   10:43 AM 01/23/2023    8:39 AM 01/07/2023    2:59 PM  BP/Weight  Systolic BP 122 -- 145  Diastolic BP 74 -- 86  Wt. (Lbs) 263 265   BMI 35.67 kg/m2 35.94 kg/m2     Physical Exam Vitals and nursing note reviewed.  Constitutional:      General: He is not in acute distress.    Appearance: Normal appearance.  HENT:     Head: Normocephalic and atraumatic.  Cardiovascular:     Rate and Rhythm: Normal rate and regular rhythm.  Pulmonary:     Effort: Pulmonary effort is normal.     Breath sounds: Normal breath sounds. No wheezing, rhonchi or rales.  Neurological:     Mental Status: He is alert.  Psychiatric:        Mood and Affect: Mood normal.        Behavior: Behavior normal.     Lab  Results  Component Value Date   WBC 7.8 09/18/2022   HGB 13.8 09/18/2022   HCT 40.6 09/18/2022   PLT 231 09/18/2022   GLUCOSE 103 (H) 09/18/2022   CHOL 204 (H) 09/18/2022   TRIG 104 09/18/2022   HDL 54 09/18/2022   LDLCALC 131 (H) 09/18/2022   ALT 16 09/18/2022   AST 20 09/18/2022   NA 143 09/18/2022   K 4.2 09/18/2022   CL 104 09/18/2022   CREATININE 1.15 09/18/2022   BUN 19 09/18/2022   CO2 26 09/18/2022   PSA 0.2 07/17/2014     Assessment & Plan:   Problem List Items Addressed This Visit       Respiratory   Obstructive sleep apnea - Primary    Patient has been compliant with treatment.  However, his machine no longer works.  Will send in Rx for new machine and supplies.       Everlene Other DO [facility]

## 2023-03-16 NOTE — Assessment & Plan Note (Signed)
Patient has been compliant with treatment.  However, his machine no longer works.  Will send in Rx for new machine and supplies.

## 2023-03-20 ENCOUNTER — Ambulatory Visit: Payer: Medicare Other | Admitting: Family Medicine

## 2023-04-06 ENCOUNTER — Other Ambulatory Visit: Payer: Self-pay | Admitting: Family Medicine

## 2023-04-06 DIAGNOSIS — I10 Essential (primary) hypertension: Secondary | ICD-10-CM

## 2023-04-13 DIAGNOSIS — G4733 Obstructive sleep apnea (adult) (pediatric): Secondary | ICD-10-CM | POA: Diagnosis not present

## 2023-04-20 ENCOUNTER — Telehealth: Payer: Self-pay | Admitting: *Deleted

## 2023-04-20 NOTE — Telephone Encounter (Signed)
 Copied from CRM [id number]. Topic: General - Other >> Apr 20, 2023 12:29 PM Zayanah H wrote: Reason for CRM: going to bring in compliance form for cpap machine

## 2023-05-14 ENCOUNTER — Ambulatory Visit: Payer: Medicare Other | Admitting: Family Medicine

## 2023-05-14 DIAGNOSIS — G4733 Obstructive sleep apnea (adult) (pediatric): Secondary | ICD-10-CM | POA: Diagnosis not present

## 2023-06-01 ENCOUNTER — Other Ambulatory Visit: Payer: Self-pay | Admitting: Surgical

## 2023-06-01 ENCOUNTER — Telehealth: Payer: Self-pay

## 2023-06-01 MED ORDER — METHOCARBAMOL 500 MG PO TABS: ORAL_TABLET | ORAL | 1 refills | Status: AC

## 2023-06-01 NOTE — Telephone Encounter (Signed)
 Thank you :)

## 2023-06-01 NOTE — Telephone Encounter (Signed)
Can we get a refill of Robaxin for this patient please

## 2023-06-01 NOTE — Telephone Encounter (Signed)
 Sent in

## 2023-06-12 DIAGNOSIS — G4733 Obstructive sleep apnea (adult) (pediatric): Secondary | ICD-10-CM | POA: Diagnosis not present

## 2023-07-09 DIAGNOSIS — L821 Other seborrheic keratosis: Secondary | ICD-10-CM | POA: Diagnosis not present

## 2023-07-09 DIAGNOSIS — D225 Melanocytic nevi of trunk: Secondary | ICD-10-CM | POA: Diagnosis not present

## 2023-07-09 DIAGNOSIS — L570 Actinic keratosis: Secondary | ICD-10-CM | POA: Diagnosis not present

## 2023-07-09 DIAGNOSIS — D2272 Melanocytic nevi of left lower limb, including hip: Secondary | ICD-10-CM | POA: Diagnosis not present

## 2023-07-09 DIAGNOSIS — D2271 Melanocytic nevi of right lower limb, including hip: Secondary | ICD-10-CM | POA: Diagnosis not present

## 2023-07-09 DIAGNOSIS — L820 Inflamed seborrheic keratosis: Secondary | ICD-10-CM | POA: Diagnosis not present

## 2023-07-10 ENCOUNTER — Ambulatory Visit: Payer: Medicare Other | Admitting: Family Medicine

## 2023-07-10 VITALS — BP 135/83 | HR 64 | Temp 98.2°F | Ht 72.0 in | Wt 266.4 lb

## 2023-07-10 DIAGNOSIS — G4733 Obstructive sleep apnea (adult) (pediatric): Secondary | ICD-10-CM | POA: Diagnosis not present

## 2023-07-10 DIAGNOSIS — I10 Essential (primary) hypertension: Secondary | ICD-10-CM | POA: Diagnosis not present

## 2023-07-10 DIAGNOSIS — N200 Calculus of kidney: Secondary | ICD-10-CM | POA: Diagnosis not present

## 2023-07-10 NOTE — Patient Instructions (Signed)
Continue your medications.  Call with concerns.  Take care  Dr. Adriana Simas

## 2023-07-10 NOTE — Assessment & Plan Note (Signed)
 Stable.  Compliant with therapy.

## 2023-07-10 NOTE — Assessment & Plan Note (Signed)
 Stable.  Continue enalapril.  Compliant.

## 2023-07-10 NOTE — Assessment & Plan Note (Signed)
 Flomax discontinued today.

## 2023-07-10 NOTE — Progress Notes (Signed)
 Subjective:  Patient ID: Fernando Riley, male    DOB: 1950-09-21  Age: 73 y.o. MRN: [id number]  CC: Follow-up   HPI:  73 year old male presents for follow-up.  Patient endorses compliance with CPAP therapy.  He brings in a log of his usage.  From February to March of this year he is compliant 96.67% of the time.  He is doing well with CPAP therapy.  Blood pressure is well-controlled on enalapril.  Overall he is doing well and has no other complaints or concerns at this time.  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   Obstructive sleep apnea 08/20/2015   Essential hypertension, benign 07/20/2013    Social Hx   Social History   Socioeconomic History   Marital status: Married    Spouse name: Not on file   Number of children: Not on file   Years of education: Not on file   Highest education level: Some college, no degree  Occupational History   Not on file  Tobacco Use   Smoking status: Never   Smokeless tobacco: Never  Vaping Use   Vaping status: Never Used  Substance and Sexual Activity   Alcohol use: No   Drug use: No   Sexual activity: Not on file  Other Topics Concern   Not on file  Social History Narrative   Not on file   Social Drivers of Health   Financial Resource Strain: Medium Risk (03/15/2023)   Overall Financial Resource Strain (CARDIA)    Difficulty of Paying Living Expenses: Somewhat hard  Food Insecurity: No Food Insecurity (03/15/2023)   Hunger Vital Sign    Worried About Running Out of Food in the Last Year: Never true    Ran Out of Food in the Last Year: Never true  Transportation Needs: No Transportation Needs (03/15/2023)   PRAPARE - Administrator, Civil Service (Medical): No    Lack of Transportation (Non-Medical): No   Physical Activity: Insufficiently Active (03/15/2023)   Exercise Vital Sign    Days of Exercise per Week: 3 days    Minutes of Exercise per Session: 20 min  Stress: No Stress Concern Present (03/15/2023)   [employer] of Occupational Health - Occupational Stress Questionnaire    Feeling of Stress : Not at all  Social Connections: Socially Integrated (03/15/2023)   Social Connection and Isolation Panel [NHANES]    Frequency of Communication with Friends and Family: More than three times a week    Frequency of Social Gatherings with Friends and Family: More than three times a week    Attends Religious Services: More than 4 times per year    Active Member of [employer] or Organizations: Yes    Attends Engineer, structural: More than 4 times per year    Marital Status: Married    Review of Systems Per HPI  Objective:  BP 135/83   Pulse 64   Temp 98.2 F (36.8 C)   Ht 6' (1.829 m)   Wt 266 lb 6.4 oz (120.8 kg)   SpO2 97%   BMI 36.13 kg/m      07/10/2023    8:58 AM 03/16/2023   10:43 AM 01/23/2023    8:39 AM  BP/Weight  Systolic BP 135 122 --  Diastolic BP 83 74 --  Wt. (  Lbs) 266.4 263 265  BMI 36.13 kg/m2 35.67 kg/m2 35.94 kg/m2    Physical Exam Vitals and nursing note reviewed.  Constitutional:      General: He is not in acute distress.    Appearance: Normal appearance.  HENT:     Head: Normocephalic and atraumatic.  Eyes:     General:        Right eye: No discharge.        Left eye: No discharge.     Conjunctiva/sclera: Conjunctivae normal.  Cardiovascular:     Rate and Rhythm: Normal rate and regular rhythm.  Pulmonary:     Effort: Pulmonary effort is normal.     Breath sounds: Normal breath sounds. No wheezing, rhonchi or rales.  Neurological:     Mental Status: He is alert.  Psychiatric:        Mood and Affect: Mood normal.        Behavior: Behavior normal.     Lab Results  Component Value Date   WBC 7.8 09/18/2022   HGB 13.8 09/18/2022    HCT 40.6 09/18/2022   PLT 231 09/18/2022   GLUCOSE 103 (H) 09/18/2022   CHOL 204 (H) 09/18/2022   TRIG 104 09/18/2022   HDL 54 09/18/2022   LDLCALC 131 (H) 09/18/2022   ALT 16 09/18/2022   AST 20 09/18/2022   NA 143 09/18/2022   K 4.2 09/18/2022   CL 104 09/18/2022   CREATININE 1.15 09/18/2022   BUN 19 09/18/2022   CO2 26 09/18/2022   PSA 0.2 07/17/2014     Assessment & Plan:  Essential hypertension, benign Assessment & Plan: Stable.  Continue enalapril.  Compliant.   Obstructive sleep apnea Assessment & Plan: Stable.  Compliant with therapy.   Nephrolithiasis Assessment & Plan: Flomax discontinued today.     Follow-up: Appointment already scheduled for physical.  Everlene Other DO [facility]

## 2023-07-13 DIAGNOSIS — G4733 Obstructive sleep apnea (adult) (pediatric): Secondary | ICD-10-CM | POA: Diagnosis not present

## 2023-08-12 DIAGNOSIS — G4733 Obstructive sleep apnea (adult) (pediatric): Secondary | ICD-10-CM | POA: Diagnosis not present

## 2023-09-12 DIAGNOSIS — G4733 Obstructive sleep apnea (adult) (pediatric): Secondary | ICD-10-CM | POA: Diagnosis not present

## 2023-10-08 ENCOUNTER — Encounter: Payer: Self-pay | Admitting: Family Medicine

## 2023-10-08 ENCOUNTER — Ambulatory Visit (INDEPENDENT_AMBULATORY_CARE_PROVIDER_SITE_OTHER): Admitting: Family Medicine

## 2023-10-08 VITALS — BP 137/86 | HR 60 | Temp 98.4°F | Ht 72.0 in | Wt 263.0 lb

## 2023-10-08 DIAGNOSIS — Z0001 Encounter for general adult medical examination with abnormal findings: Secondary | ICD-10-CM | POA: Diagnosis not present

## 2023-10-08 DIAGNOSIS — Z0000 Encounter for general adult medical examination without abnormal findings: Secondary | ICD-10-CM | POA: Insufficient documentation

## 2023-10-08 DIAGNOSIS — Z130 Encounter for screening for diseases of the blood and blood-forming organs and certain disorders involving the immune mechanism: Secondary | ICD-10-CM | POA: Diagnosis not present

## 2023-10-08 DIAGNOSIS — E7800 Pure hypercholesterolemia, unspecified: Secondary | ICD-10-CM | POA: Diagnosis not present

## 2023-10-08 DIAGNOSIS — I10 Essential (primary) hypertension: Secondary | ICD-10-CM

## 2023-10-08 MED ORDER — ENALAPRIL MALEATE 10 MG PO TABS
10.0000 mg | ORAL_TABLET | Freq: Every day | ORAL | 3 refills | Status: AC
Start: 2023-10-08 — End: ?

## 2023-10-08 NOTE — Patient Instructions (Signed)
 Colonoscopy due in 2027.  Labs today.  Follow up annually.  Take care  Dr. Bluford

## 2023-10-08 NOTE — Assessment & Plan Note (Signed)
 Doing well. Medication refilled.  Labs today. Colonoscopy not due until 2027.  Health maintenance section has been updated.

## 2023-10-08 NOTE — Progress Notes (Signed)
 Subjective:  Patient ID: Fernando Riley, male    DOB: 1950/10/13  Age: 73 y.o. MRN: [id number]  CC:   Chief Complaint  Patient presents with   Annual Exam    HPI:  73 year old male presents for follow-up.  Overall doing well.  Needs refill on blood pressure medication.  Blood pressure well-controlled.  Needs labs today.  Patient curious about when his next colonoscopy is.  His colonoscopy is due in 2027.  His healthcare maintenance section has been updated.  Patient Active Problem List   Diagnosis Date Noted   Annual physical exam 10/08/2023   Hyperlipidemia 03/19/2021   Nephrolithiasis 01/09/2020   Obesity (BMI 35.0-39.9 without comorbidity) 01/26/2019   History of total knee arthroplasty 01/25/2019   Peyronie's disease 09/14/2017   Obstructive sleep apnea 08/20/2015   Essential hypertension, benign 07/20/2013    Social Hx   Social History   Socioeconomic History   Marital status: Married    Spouse name: Not on file   Number of children: Not on file   Years of education: Not on file   Highest education level: Some college, no degree  Occupational History   Not on file  Tobacco Use   Smoking status: Never   Smokeless tobacco: Never  Vaping Use   Vaping status: Never Used  Substance and Sexual Activity   Alcohol use: No   Drug use: No   Sexual activity: Not on file  Other Topics Concern   Not on file  Social History Narrative   Not on file   Social Drivers of Health   Financial Resource Strain: Medium Risk (03/15/2023)   Overall Financial Resource Strain (CARDIA)    Difficulty of Paying Living Expenses: Somewhat hard  Food Insecurity: No Food Insecurity (03/15/2023)   Hunger Vital Sign    Worried About Running Out of Food in the Last Year: Never true    Ran Out of Food in the Last Year: Never true  Transportation Needs: No Transportation Needs (03/15/2023)   PRAPARE - Administrator, Civil Service (Medical): No    Lack of Transportation  (Non-Medical): No  Physical Activity: Insufficiently Active (03/15/2023)   Exercise Vital Sign    Days of Exercise per Week: 3 days    Minutes of Exercise per Session: 20 min  Stress: No Stress Concern Present (03/15/2023)   [employer] of Occupational Health - Occupational Stress Questionnaire    Feeling of Stress : Not at all  Social Connections: Socially Integrated (03/15/2023)   Social Connection and Isolation Panel    Frequency of Communication with Friends and Family: More than three times a week    Frequency of Social Gatherings with Friends and Family: More than three times a week    Attends Religious Services: More than 4 times per year    Active Member of [employer] or Organizations: Yes    Attends Engineer, structural: More than 4 times per year    Marital Status: Married    Review of Systems  Constitutional: Negative.   Respiratory: Negative.    Cardiovascular: Negative.     Objective:  BP 137/86   Pulse 60   Temp 98.4 F (36.9 C)   Ht 6' (1.829 m)   Wt 263 lb (119.3 kg)   SpO2 96%   BMI 35.67 kg/m      10/08/2023    9:17 AM 10/08/2023    8:55 AM 07/10/2023    8:58 AM  BP/Weight  Systolic BP 137  154 135  Diastolic BP 86 87 83  Wt. (Lbs)  263 266.4  BMI  35.67 kg/m2 36.13 kg/m2    Physical Exam Vitals and nursing note reviewed.  Constitutional:      General: He is not in acute distress.    Appearance: Normal appearance. He is obese.  HENT:     Head: Normocephalic and atraumatic.   Eyes:     General:        Right eye: No discharge.        Left eye: No discharge.     Conjunctiva/sclera: Conjunctivae normal.    Cardiovascular:     Rate and Rhythm: Normal rate and regular rhythm.  Pulmonary:     Effort: Pulmonary effort is normal.     Breath sounds: Normal breath sounds. No wheezing, rhonchi or rales.  Abdominal:     General: There is no distension.     Palpations: Abdomen is soft.     Tenderness: There is no abdominal tenderness.    Neurological:     Mental Status: He is alert.   Psychiatric:        Mood and Affect: Mood normal.        Behavior: Behavior normal.     Lab Results  Component Value Date   WBC 7.8 09/18/2022   HGB 13.8 09/18/2022   HCT 40.6 09/18/2022   PLT 231 09/18/2022   GLUCOSE 103 (H) 09/18/2022   CHOL 204 (H) 09/18/2022   TRIG 104 09/18/2022   HDL 54 09/18/2022   LDLCALC 131 (H) 09/18/2022   ALT 16 09/18/2022   AST 20 09/18/2022   NA 143 09/18/2022   K 4.2 09/18/2022   CL 104 09/18/2022   CREATININE 1.15 09/18/2022   BUN 19 09/18/2022   CO2 26 09/18/2022   PSA 0.2 07/17/2014     Assessment & Plan:  Annual physical exam Assessment & Plan: Doing well. Medication refilled.  Labs today. Colonoscopy not due until 2027.  Health maintenance section has been updated.   Essential hypertension, benign -     Enalapril  Maleate; Take 1 tablet (10 mg total) by mouth daily.  Dispense: 90 tablet; Refill: 3 -     CMP14+EGFR -     Microalbumin / creatinine urine ratio  Pure hypercholesterolemia -     Lipid panel  Screening for deficiency anemia -     CBC    Follow-up: Follow-up annually  Christoher Drudge DO [facility]

## 2023-10-09 LAB — CMP14+EGFR
ALT: 18 IU/L (ref 0–44)
AST: 18 IU/L (ref 0–40)
Albumin: 4.2 g/dL (ref 3.8–4.8)
Alkaline Phosphatase: 113 IU/L (ref 44–121)
BUN/Creatinine Ratio: 16 (ref 10–24)
BUN: 17 mg/dL (ref 8–27)
Bilirubin Total: 0.6 mg/dL (ref 0.0–1.2)
CO2: 23 mmol/L (ref 20–29)
Calcium: 9.2 mg/dL (ref 8.6–10.2)
Chloride: 107 mmol/L — ABNORMAL HIGH (ref 96–106)
Creatinine, Ser: 1.04 mg/dL (ref 0.76–1.27)
Globulin, Total: 2.1 g/dL (ref 1.5–4.5)
Glucose: 96 mg/dL (ref 70–99)
Potassium: 4.8 mmol/L (ref 3.5–5.2)
Sodium: 144 mmol/L (ref 134–144)
Total Protein: 6.3 g/dL (ref 6.0–8.5)
eGFR: 77 mL/min/{1.73_m2} (ref >59)

## 2023-10-09 LAB — LIPID PANEL
Chol/HDL Ratio: 3.9 ratio (ref 0.0–5.0)
Cholesterol, Total: 160 mg/dL (ref 100–199)
HDL: 41 mg/dL (ref >39)
LDL Chol Calc (NIH): 102 mg/dL — ABNORMAL HIGH (ref 0–99)
Triglycerides: 90 mg/dL (ref 0–149)
VLDL Cholesterol Cal: 17 mg/dL (ref 5–40)

## 2023-10-09 LAB — MICROALBUMIN / CREATININE URINE RATIO
Creatinine, Urine: 172.4 mg/dL
Microalb/Creat Ratio: 6 mg/g{creat} (ref 0–29)
Microalbumin, Urine: 9.5 ug/mL

## 2023-10-09 LAB — CBC
Hematocrit: 44.7 % (ref 37.5–51.0)
Hemoglobin: 14.4 g/dL (ref 13.0–17.7)
MCH: 31.9 pg (ref 26.6–33.0)
MCHC: 32.2 g/dL (ref 31.5–35.7)
MCV: 99 fL — ABNORMAL HIGH (ref 79–97)
Platelets: 217 10*3/uL (ref 150–450)
RBC: 4.51 x10E6/uL (ref 4.14–5.80)
RDW: 12.6 % (ref 11.6–15.4)
WBC: 7.4 10*3/uL (ref 3.4–10.8)

## 2023-10-11 ENCOUNTER — Ambulatory Visit: Payer: Self-pay | Admitting: Family Medicine

## 2023-11-03 ENCOUNTER — Ambulatory Visit (HOSPITAL_COMMUNITY)
Admission: RE | Admit: 2023-11-03 | Discharge: 2023-11-03 | Disposition: A | Discharge status: Home / Self Care | Source: Ambulatory Visit | Attending: Urology | Admitting: Urology

## 2023-11-03 DIAGNOSIS — N200 Calculus of kidney: Secondary | ICD-10-CM | POA: Insufficient documentation

## 2023-11-03 DIAGNOSIS — I878 Other specified disorders of veins: Secondary | ICD-10-CM | POA: Diagnosis not present

## 2023-11-05 DIAGNOSIS — H01004 Unspecified blepharitis left upper eyelid: Secondary | ICD-10-CM | POA: Diagnosis not present

## 2023-11-05 DIAGNOSIS — H0014 Chalazion left upper eyelid: Secondary | ICD-10-CM | POA: Diagnosis not present

## 2023-11-06 ENCOUNTER — Ambulatory Visit: Payer: Medicare Other | Admitting: Urology

## 2023-11-06 ENCOUNTER — Encounter: Payer: Self-pay | Admitting: Urology

## 2023-11-06 VITALS — BP 121/74 | HR 65

## 2023-11-06 DIAGNOSIS — N200 Calculus of kidney: Secondary | ICD-10-CM

## 2023-11-06 LAB — URINALYSIS, ROUTINE W REFLEX MICROSCOPIC
Bilirubin, UA: NEGATIVE
Glucose, UA: NEGATIVE
Ketones, UA: NEGATIVE
Leukocytes,UA: NEGATIVE
Nitrite, UA: NEGATIVE
Protein,UA: NEGATIVE
RBC, UA: NEGATIVE
Specific Gravity, UA: 1.03 (ref 1.005–1.030)
Urobilinogen, Ur: 0.2 mg/dL (ref 0.2–1.0)
pH, UA: 6 (ref 5.0–7.5)

## 2023-11-06 NOTE — Progress Notes (Signed)
 11/06/2023 9:01 AM   Fernando Riley 11-17-1950 [id number]  Referring provider: Cook, Jayce G, DO [address] [place],  [place] [address]  nephrolithiasis   HPI: [name] is a 73yo here for followup for nephrolithiasis. No stone events since last. He denies any flank pain. Nocturia 1x. IPSS 4 QOL 1 on no BPh medications. KUb today shows no definitive calculi.    PMH: Past Medical History:  Diagnosis Date   Arthritis    Cat allergies    Fatty liver    History of kidney stones    History of migraine    Hypertension    Impaired fasting glucose    Kidney stone    Melanoma (HCC)    lower legs bilaterally   OSA (obstructive sleep apnea)    unable to sleep with use of CPAP   Peyronie disease    Pollen allergies    Reactive airways dysfunction syndrome (HCC)    Venous stasis     Surgical History: Past Surgical History:  Procedure Laterality Date   COLONOSCOPY N/A 01/21/2016   Procedure: COLONOSCOPY;  Surgeon: Fernando CHRISTELLA Hollingshead, MD;  [facility];  Service: Endoscopy;  Laterality: N/A;  1:00 PM - moved to 12:30 - office notified per [place]   EXTRACORPOREAL SHOCK WAVE LITHOTRIPSY Left 01/17/2020   Procedure: EXTRACORPOREAL SHOCK WAVE LITHOTRIPSY (ESWL);  Surgeon: Fernando Fernando CROME, MD;  Location: [facility];  Service: Urology;  Laterality: Left;   EXTRACORPOREAL SHOCK WAVE LITHOTRIPSY Right 02/14/2020   Procedure: EXTRACORPOREAL SHOCK WAVE LITHOTRIPSY (ESWL);  Surgeon: Fernando Fernando CROME, MD;  Location: [facility];  Service: Urology;  Laterality: Right;   EXTRACORPOREAL SHOCK WAVE LITHOTRIPSY Right 01/15/2021   Procedure: EXTRACORPOREAL SHOCK WAVE LITHOTRIPSY (ESWL);  Surgeon: Fernando Fernando CROME, MD;  Location: [facility];  Service: Urology;  Laterality: Right;   KNEE ARTHROSCOPY Bilateral    POLYPECTOMY  01/21/2016   Procedure: POLYPECTOMY;  Surgeon: Fernando CHRISTELLA Hollingshead, MD;  Location: [facility];  Service: Endoscopy;;  colon   SHOULDER ARTHROSCOPY Left    SHOULDER SURGERY  Right 2016   TOOTH EXTRACTION     TOTAL KNEE ARTHROPLASTY Right 01/25/2019   Procedure: TOTAL KNEE ARTHROPLASTY;  Surgeon: Fernando Cough, MD;  Location: [facility];  Service: Orthopedics;  Laterality: Right;  70 mins   TOTAL KNEE ARTHROPLASTY Left 03/22/2019   Procedure: TOTAL KNEE ARTHROPLASTY;  Surgeon: Fernando Cough, MD;  Location: [facility];  Service: Orthopedics;  Laterality: Left;  70 mins    Home Medications:  Allergies as of 11/06/2023       Reactions   Cat Dander    Other reaction(s): Not available   Food Color Red    Other reaction(s): Not available   Other    Cats   Red Dye #40 (allura Red) Hives   Pt's wife reported he gets hives with food red dye, but can take tablets that are red or pink.         Medication List        Accurate as of November 06, 2023  9:01 AM. If you have any questions, ask your nurse or doctor.          acetaminophen  500 MG tablet Commonly known as: TYLENOL  Take 500 mg by mouth daily.   enalapril  10 MG tablet Commonly known as: VASOTEC  Take 1 tablet (10 mg total) by mouth daily.   methocarbamol  500 MG tablet Commonly known as: ROBAXIN  PO q 8hr prn   Turmeric 500 MG  Caps Take 1,000 mg by mouth daily.        Allergies:  Allergies  Allergen Reactions   Cat Dander     Other reaction(s): Not available   Food Color Red     Other reaction(s): Not available   Other     Cats   Red Dye #40 (Allura Red) Hives    Pt's wife reported he gets hives with food red dye, but can take tablets that are red or pink.     Family History: Family History  Problem Relation Age of Onset   Heart disease Father    Diabetes Brother    Hyperlipidemia Brother     Social History:  reports that he has never smoked. He has never used smokeless tobacco. He reports that he does not drink alcohol and does not use drugs.  ROS: All other review of systems were reviewed and are negative except what is noted above in HPI  Physical Exam: BP 121/74   Pulse  65   Constitutional:  Alert and oriented, No acute distress. HEENT: [place] AT, moist mucus membranes.  Trachea midline, no masses. Cardiovascular: No clubbing, cyanosis, or edema. Respiratory: Normal respiratory effort, no increased work of breathing. GI: Abdomen is soft, nontender, nondistended, no abdominal masses GU: No CVA tenderness.  Lymph: No cervical or inguinal lymphadenopathy. Skin: No rashes, bruises or suspicious lesions. Neurologic: Grossly intact, no focal deficits, moving all 4 extremities. Psychiatric: Normal mood and affect.  Laboratory Data: Lab Results  Component Value Date   WBC 7.4 10/08/2023   HGB 14.4 10/08/2023   HCT 44.7 10/08/2023   MCV 99 (H) 10/08/2023   PLT 217 10/08/2023    Lab Results  Component Value Date   CREATININE 1.04 10/08/2023    Lab Results  Component Value Date   PSA 0.2 07/17/2014   PSA 0.31 07/11/2013    No results found for: TESTOSTERONE  No results found for: HGBA1C  Urinalysis    Component Value Date/Time   APPEARANCEUR Clear 11/05/2022 0849   GLUCOSEU Negative 11/05/2022 0849   BILIRUBINUR Negative 11/05/2022 0849   PROTEINUR Negative 11/05/2022 0849   UROBILINOGEN negative (A) 06/14/2019 1549   NITRITE Negative 11/05/2022 0849   LEUKOCYTESUR Negative 11/05/2022 0849    Lab Results  Component Value Date   LABMICR 9.5 10/08/2023   WBCUA None seen 04/12/2021   LABEPIT None seen 04/12/2021   MUCUS Present 04/12/2021   BACTERIA Few (A) 04/12/2021    Pertinent Imaging: KUb today: images reviewed and discussed with the patient  Results for orders placed in visit on 11/03/22  DG Abd 1 View  Narrative CLINICAL DATA:  Nephrolithiasis.  EXAM: ABDOMEN - 1 VIEW  COMPARISON:  Radiograph 10/19/2021. CT 01/09/2020  FINDINGS: No visualized urolithiasis. Calcifications in the left pelvis are unchanged from prior exam and typical of phleboliths. Normal bowel gas pattern. No acute osseous  findings.  IMPRESSION: No visualized urolithiasis by radiograph.   Electronically Signed By: Fernando Riley M.D. On: 11/09/2022 17:56  No results found for this or any previous visit.  No results found for this or any previous visit.  No results found for this or any previous visit.  Results for orders placed during the hospital encounter of 03/25/21  US  RENAL  Narrative CLINICAL DATA:  History of nephrolithiasis. History of prior lithotripsy.  EXAM: RENAL / URINARY TRACT ULTRASOUND COMPLETE  COMPARISON:  Abdomen 01/31/2021. CT 01/09/2020. Ultrasound 11/07/2008.  FINDINGS: Right Kidney:  Renal measurements: 12.9 x 6.4 x 6.9  cm = volume: 264 mL. Echogenicity within normal limits. No mass or hydronephrosis visualized. 6 mm echogenicity consistent with nonobstructing calyceal stone versus vascular calcification.  Left Kidney:  Renal measurements: 12.9 x 6.1 x 6.4 cm = volume: 264 mL. Echogenicity within normal limits. No mass or hydronephrosis visualized. 5 mm echogenicity consistent with nonobstructing calyceal stone versus vascular calcification.  Bladder:  Appears normal for degree of bladder distention.  Other:  None.  IMPRESSION: 1. Single tiny echogenicity noted in both kidneys consistent with tiny nonobstructing stone or vascular calcification. No hydronephrosis. 2. No acute abnormality.   Electronically Signed By: Debby  Register M.D. On: 03/26/2021 06:16  No results found for this or any previous visit.  No results found for this or any previous visit.  Results for orders placed during the hospital encounter of 01/09/20  CT RENAL STONE STUDY  Narrative CLINICAL DATA:  Left-sided flank pain for several days  EXAM: CT ABDOMEN AND PELVIS WITHOUT CONTRAST  TECHNIQUE: Multidetector CT imaging of the abdomen and pelvis was performed following the standard protocol without IV contrast.  COMPARISON:  12/09/2007  FINDINGS: Lower  chest: No acute abnormality.  Hepatobiliary: No focal liver abnormality is seen. No gallstones, gallbladder wall thickening, or biliary dilatation.  Pancreas: Unremarkable. No pancreatic ductal dilatation or surrounding inflammatory changes.  Spleen: Normal in size without focal abnormality.  Adrenals/Urinary Tract: Adrenal glands are within normal limits. Kidneys are well visualized bilaterally with bilateral renal calculi increased in size and number when compared with the prior exam. The largest of these on the right measures 4 mm in the upper pole. Largest of these on the left measures 6 mm in the midportion of the left kidney. Mild fullness of the collecting system and left ureter is seen. Some Peri ureteral stranding is noted. Two distal left ureteral stones are noted. The dominant stone measures approximately 6 mm. The smaller adjacent stone measures 1-2 mm. This is best visualized on image number 81 of series 6. The bladder is decompressed.  Stomach/Bowel: The appendix is within normal limits. Colon shows no obstructive or inflammatory changes. Small bowel and stomach appear within normal limits as well.  Vascular/Lymphatic: Aortic atherosclerosis. No enlarged abdominal or pelvic lymph nodes.  Reproductive: Prostate is unremarkable.  Other: No abdominal wall hernia or abnormality. No abdominopelvic ascites.  Musculoskeletal: No acute or significant osseous findings.  IMPRESSION: Two distal left ureteral stones as described above causing mild hydronephrosis and hydroureter.  Bilateral nonobstructing renal stones as described.   Electronically Signed By: Oneil Devonshire M.D. On: 01/09/2020 09:44   Assessment & Plan:    1. Nephrolithiasis (Primary) Followup 1 year KUB - Urinalysis, Routine w reflex microscopic   No follow-ups on file.  Fernando Clara, MD  [facility] [place]

## 2023-11-06 NOTE — Addendum Note (Signed)
 Addended by: Avi Archuleta L on: 11/06/2023 09:16 AM   Modules accepted: Orders

## 2023-11-06 NOTE — Patient Instructions (Signed)

## 2024-01-22 ENCOUNTER — Ambulatory Visit: Payer: Medicare Other

## 2024-01-22 VITALS — Ht 72.0 in | Wt 263.0 lb

## 2024-01-22 DIAGNOSIS — Z0000 Encounter for general adult medical examination without abnormal findings: Secondary | ICD-10-CM

## 2024-01-22 NOTE — Patient Instructions (Signed)
 [name],  Thank you for taking the time for your Medicare Wellness Visit. I appreciate your continued commitment to your health goals. Please review the care plan we discussed, and feel free to reach out if I can assist you further.  Medicare recommends these wellness visits once per year to help you and your care team stay ahead of potential health issues. These visits are designed to focus on prevention, allowing your provider to concentrate on managing your acute and chronic conditions during your regular appointments.  Please note that Annual Wellness Visits do not include a physical exam. Some assessments may be limited, especially if the visit was conducted virtually. If needed, we may recommend a separate in-person follow-up with your provider.  Ongoing Care Seeing your primary care provider every 3 to 6 months helps us  monitor your health and provide consistent, personalized care.   Referrals If a referral was made during today's visit and you haven't received any updates within two weeks, please contact the referred provider directly to check on the status.  Recommended Screenings:  Health Maintenance  Topic Date Due   Flu Shot  11/13/2023   DTaP/Tdap/Td vaccine (2 - Tdap) 10/07/2024*   Medicare Annual Wellness Visit  01/21/2025   Colon Cancer Screening  01/20/2026   Pneumococcal Vaccine for age over 11  Completed   Zoster (Shingles) Vaccine  Completed   Meningitis B Vaccine  Aged Out   COVID-19 Vaccine  Discontinued   Hepatitis C Screening  Discontinued  *Topic was postponed. The date shown is not the original due date.       01/22/2024    1:05 PM  Advanced Directives  Does Patient Have a Medical Advance Directive? No  Would patient like information on creating a medical advance directive? Yes (MAU/Ambulatory/Procedural Areas - Information given)   Advance Care Planning is important because it: Ensures you receive medical care that aligns with your values, goals, and  preferences. Provides guidance to your family and loved ones, reducing the emotional burden of decision-making during critical moments.  Information on Advanced Care Planning can be found at   Secretary of [facility] Advance Health Care Directives Advance Health Care Directives ([email or web address]   Vision: Annual vision screenings are recommended for early detection of glaucoma, cataracts, and diabetic retinopathy. These exams can also reveal signs of chronic conditions such as diabetes and high blood pressure.  Dental: Annual dental screenings help detect early signs of oral cancer, gum disease, and other conditions linked to overall health, including heart disease and diabetes.  Please see the attached documents for additional preventive care recommendations.

## 2024-01-22 NOTE — Progress Notes (Signed)
 Subjective:   Fernando Riley is a 73 y.o. who presents for a Medicare Wellness preventive visit.  As a reminder, Annual Wellness Visits don't include a physical exam, and some assessments may be limited, especially if this visit is performed virtually. We may recommend an in-person follow-up visit with your provider if needed.  Visit Complete: Virtual I connected with  Fernando Riley on 01/22/24 by a audio enabled telemedicine application and verified that I am speaking with the correct person using two identifiers.  Patient Location: Home  Provider Location: Home Office  I discussed the limitations of evaluation and management by telemedicine. The patient expressed understanding and agreed to proceed.  Vital Signs: Because this visit was a virtual/telehealth visit, some criteria may be missing or patient reported. Any vitals not documented were not able to be obtained and vitals that have been documented are patient reported.  VideoDeclined- This patient declined Librarian, academic. Therefore the visit was completed with audio only.  Persons Participating in Visit: Patient.  AWV Questionnaire: No: Patient Medicare AWV questionnaire was not completed prior to this visit.  Cardiac Risk Factors include: advanced age (>63men, >59 women);hypertension;male gender     Objective:    Today's Vitals   01/22/24 1303  Weight: 263 lb (119.3 kg)  Height: 6' (1.829 m)   Body mass index is 35.67 kg/m.     01/22/2024    1:05 PM 01/23/2023    9:05 AM 01/15/2022    8:49 AM 01/15/2021    6:55 AM 09/04/2020    8:27 AM 02/14/2020    7:14 AM 02/07/2020   10:49 AM  Advanced Directives  Does Patient Have a Medical Advance Directive? No No No No Yes Yes Yes  Type of Agricultural consultant;Living will Healthcare Power of [place];Living will Healthcare Power of [place];Living will  Does patient want to make changes to medical advance  directive?     No - Patient declined No - Patient declined No - Patient declined  Copy of Healthcare Power of Attorney in Chart?     No - copy requested No - copy requested No - copy requested  Would patient like information on creating a medical advance directive? Yes (MAU/Ambulatory/Procedural Areas - Information given) Yes (MAU/Ambulatory/Procedural Areas - Information given) No - Patient declined No - Patient declined       Current Medications (verified) Outpatient Encounter Medications as of 01/22/2024  Medication Sig   acetaminophen  (TYLENOL ) 500 MG tablet Take 500 mg by mouth daily.   enalapril  (VASOTEC ) 10 MG tablet Take 1 tablet (10 mg total) by mouth daily.   methocarbamol  (ROBAXIN ) 500 MG tablet PO q 8hr prn   Turmeric 500 MG CAPS Take 1,000 mg by mouth daily.    No facility-administered encounter medications on file as of 01/22/2024.    Allergies (verified) Cat dander, Food color red, Other, and Red dye #40 (allura red)   History: Past Medical History:  Diagnosis Date   Arthritis    Cat allergies    Fatty liver    History of kidney stones    History of migraine    Hypertension    Impaired fasting glucose    Kidney stone    Melanoma (HCC)    lower legs bilaterally   OSA (obstructive sleep apnea)    unable to sleep with use of CPAP   Peyronie disease    Pollen allergies    Reactive airways dysfunction syndrome (HCC)  Venous stasis    Past Surgical History:  Procedure Laterality Date   COLONOSCOPY N/A 01/21/2016   Procedure: COLONOSCOPY;  Surgeon: Lamar CHRISTELLA Hollingshead, MD;  [facility];  Service: Endoscopy;  Laterality: N/A;  1:00 PM - moved to 12:30 - office notified per [place]   EXTRACORPOREAL SHOCK WAVE LITHOTRIPSY Left 01/17/2020   Procedure: EXTRACORPOREAL SHOCK WAVE LITHOTRIPSY (ESWL);  Surgeon: Sherrilee Fernando CROME, MD;  Location: [facility];  Service: Urology;  Laterality: Left;   EXTRACORPOREAL SHOCK WAVE LITHOTRIPSY Right 02/14/2020   Procedure:  EXTRACORPOREAL SHOCK WAVE LITHOTRIPSY (ESWL);  Surgeon: Sherrilee Fernando CROME, MD;  Location: [facility];  Service: Urology;  Laterality: Right;   EXTRACORPOREAL SHOCK WAVE LITHOTRIPSY Right 01/15/2021   Procedure: EXTRACORPOREAL SHOCK WAVE LITHOTRIPSY (ESWL);  Surgeon: Sherrilee Fernando CROME, MD;  Location: [facility];  Service: Urology;  Laterality: Right;   KNEE ARTHROSCOPY Bilateral    POLYPECTOMY  01/21/2016   Procedure: POLYPECTOMY;  Surgeon: Lamar CHRISTELLA Hollingshead, MD;  [facility]: [facility];  Service: Endoscopy;;  colon   SHOULDER ARTHROSCOPY Left    SHOULDER SURGERY Right 2016   TOOTH EXTRACTION     TOTAL KNEE ARTHROPLASTY Right 01/25/2019   Procedure: TOTAL KNEE ARTHROPLASTY;  Surgeon: Ernie Cough, MD;  Location: [facility];  Service: Orthopedics;  Laterality: Right;  70 mins   TOTAL KNEE ARTHROPLASTY Left 03/22/2019   Procedure: TOTAL KNEE ARTHROPLASTY;  Surgeon: Ernie Cough, MD;  Location: [facility];  Service: Orthopedics;  Laterality: Left;  70 mins   Family History  Problem Relation Age of Onset   Heart disease Father    Diabetes Brother    Hyperlipidemia Brother    Social History   Socioeconomic History   Marital status: Married    Spouse name: Not on file   Number of children: Not on file   Years of education: Not on file   Highest education level: Some college, no degree  Occupational History   Not on file  Tobacco Use   Smoking status: Never   Smokeless tobacco: Never  Vaping Use   Vaping status: Never Used  Substance and Sexual Activity   Alcohol use: No   Drug use: No   Sexual activity: Not on file  Other Topics Concern   Not on file  Social History Narrative   Not on file   Social Drivers of Health   Financial Resource Strain: Low Risk  (01/22/2024)   Overall Financial Resource Strain (CARDIA)    Difficulty of Paying Living Expenses: Not very hard  Food Insecurity: No Food Insecurity (01/22/2024)   Hunger Vital Sign    Worried About Running Out of Food in the Last Year:  Never true    Ran Out of Food in the Last Year: Never true  Transportation Needs: No Transportation Needs (01/22/2024)   PRAPARE - Administrator, Civil Service (Medical): No    Lack of Transportation (Non-Medical): No  Physical Activity: Insufficiently Active (01/22/2024)   Exercise Vital Sign    Days of Exercise per Week: 3 days    Minutes of Exercise per Session: 20 min  Stress: No Stress Concern Present (01/22/2024)   [employer] of Occupational Health - Occupational Stress Questionnaire    Feeling of Stress: Not at all  Social Connections: Socially Integrated (01/22/2024)   Social Connection and Isolation Panel    Frequency of Communication with Friends and Family: More than three times a week    Frequency of Social Gatherings with Friends and Family:  More than three times a week    Attends Religious Services: More than 4 times per year    Active Member of Clubs or Organizations: Yes    Attends Banker Meetings: More than 4 times per year    Marital Status: Married    Tobacco Counseling Counseling given: Not Answered    Clinical Intake:  Pre-visit preparation completed: Yes  Pain : No/denies pain  Diabetes: No   How often do you need to have someone help you when you read instructions, pamphlets, or other written materials from your doctor or pharmacy?: 1 - Never  Interpreter Needed?: No  Information entered by :: Charmaine Bloodgood LPN   Activities of Daily Living     01/22/2024    1:04 PM 01/23/2023    9:02 AM  In your present state of health, do you have any difficulty performing the following activities:  Hearing? 0 0  Vision? 0 0  Difficulty concentrating or making decisions? 0 0  Walking or climbing stairs? 0 0  Dressing or bathing? 0 0  Doing errands, shopping? 0 0  Preparing Food and eating ? N N  Using the Toilet? N N  In the past six months, have you accidently leaked urine? N N  Do you have problems with loss of bowel  control? N N  Managing your Medications? N N  Managing your Finances? N N  Housekeeping or managing your Housekeeping? N N    Patient Care Team: Cook, Jayce G, DO as PCP - General (Family Medicine) McKenzie, Fernando CROME, MD as Consulting Physician (Urology) [facility], [facility]  Joshua Sieving, MD as Consulting Physician (Dermatology)  I have updated your Care Teams any recent Medical Services you may have received from other providers in the past year.     Assessment:   This is a routine wellness examination for Zaim.  Hearing/Vision screen Hearing Screening - Comments:: Patient is able to hear conversational tones without difficulty. No issues reported.   Vision Screening - Comments:: No vision problems; has upcoming screening scheduled    Goals Addressed             This Visit's Progress    DIET - EAT MORE FRUITS AND VEGETABLES   On track    Maintain health and independence   On track      Depression Screen     01/22/2024    1:04 PM 03/16/2023   10:44 AM 01/23/2023    9:04 AM 09/18/2022    8:32 AM 03/19/2022    8:26 AM 01/15/2022    8:47 AM 09/17/2021    8:39 AM  PHQ 2/9 Scores  PHQ - 2 Score 0 0 0 0 0 0 0  PHQ- 9 Score  0  0  0     Fall Risk     01/22/2024    1:06 PM 03/16/2023   10:44 AM 01/23/2023    9:05 AM 09/18/2022    8:32 AM 03/19/2022    8:25 AM  Fall Risk   Falls in the past year? 0 0 0 0 0  Number falls in past yr: 0 0 0 0 0  Injury with Fall? 0 0 0 0 0  Risk for fall due to : No Fall Risks No Fall Risks No Fall Risks No Fall Risks No Fall Risks  Follow up Falls prevention discussed;Education provided;Falls evaluation completed Falls evaluation completed Falls prevention discussed;Education provided;Falls evaluation completed Falls evaluation completed Falls evaluation completed  Data saved with a previous flowsheet row definition    MEDICARE RISK AT HOME:  Medicare Risk at Home Any stairs in or around the home?: No If  so, are there any without handrails?: No Home free of loose throw rugs in walkways, pet beds, electrical cords, etc?: Yes Adequate lighting in your home to reduce risk of falls?: Yes Life alert?: No Use of a cane, walker or w/c?: No Grab bars in the bathroom?: Yes Shower chair or bench in shower?: No Elevated toilet seat or a handicapped toilet?: Yes  TIMED UP AND GO:  Was the test performed?  No  Cognitive Function: 6CIT completed        01/22/2024    1:06 PM 01/23/2023    9:06 AM 01/15/2022    8:51 AM  6CIT Screen  What Year? 0 points 0 points 0 points  What month? 0 points 0 points 0 points  What time? 0 points 0 points 0 points  Count back from 20 0 points 0 points 0 points  Months in reverse 0 points 0 points 0 points  Repeat phrase 0 points 0 points 0 points  Total Score 0 points 0 points 0 points    Immunizations Immunization History  Administered Date(s) Administered   Influenza,inj,Quad PF,6+ Mos 01/12/2017, 01/16/2018, 01/12/2019   Influenza,inj,quad, With Preservative 01/16/2018   Influenza-Unspecified 01/28/2016, 01/12/2017, 01/18/2020, 01/12/2021   Pneumococcal Conjugate-13 01/12/2019   Pneumococcal Polysaccharide-23 09/14/2017   Td 02/08/2008   Zoster Recombinant(Shingrix) 04/01/2022, 11/09/2022   Zoster, Live 06/16/2016    Screening Tests Health Maintenance  Topic Date Due   Influenza Vaccine  11/13/2023   DTaP/Tdap/Td (2 - Tdap) 10/07/2024 (Originally 02/07/2018)   Medicare Annual Wellness (AWV)  01/21/2025   Colonoscopy  01/20/2026   Pneumococcal Vaccine: 50+ Years  Completed   Zoster Vaccines- Shingrix  Completed   Meningococcal B Vaccine  Aged Out   COVID-19 Vaccine  Discontinued   Hepatitis C Screening  Discontinued    Health Maintenance Items Addressed: Vaccines Due: Flu  Additional Screening:  Vision Screening: Recommended annual ophthalmology exams for early detection of glaucoma and other disorders of the eye. Is the patient up to  date with their annual eye exam?  Yes  Who is the provider or what is the name of the office in which the patient attends annual eye exams? [facility]   Dental Screening: Recommended annual dental exams for proper oral hygiene  Community Resource Referral / Chronic Care Management: CRR required this visit?  No   CCM required this visit?  No   Plan:    I have personally reviewed and noted the following in the patient's chart:   Medical and social history Use of alcohol, tobacco or illicit drugs  Current medications and supplements including opioid prescriptions. Patient is not currently taking opioid prescriptions. Functional ability and status Nutritional status Physical activity Advanced directives List of other physicians Hospitalizations, surgeries, and ER visits in previous 12 months Vitals Screenings to include cognitive, depression, and falls Referrals and appointments  In addition, I have reviewed and discussed with patient certain preventive protocols, quality metrics, and best practice recommendations. A written personalized care plan for preventive services as well as general preventive health recommendations were provided to patient.   Lavelle Pfeiffer [place], [place]   [id number]   After Visit Summary: (MyChart) Due to this being a telephonic visit, the after visit summary with patients personalized plan was offered to patient via MyChart   Notes: Nothing significant to report at this  time.

## 2024-01-28 DIAGNOSIS — H5203 Hypermetropia, bilateral: Secondary | ICD-10-CM | POA: Diagnosis not present

## 2024-02-15 ENCOUNTER — Encounter: Payer: Self-pay | Admitting: Radiology

## 2024-11-11 ENCOUNTER — Ambulatory Visit: Admitting: Urology

## 2025-01-27 ENCOUNTER — Ambulatory Visit
